# Patient Record
Sex: Male | Born: 1975 | Race: White | Hispanic: No | Marital: Married | Smoking: Never smoker
Health system: Southern US, Community
[De-identification: ages and names within clinical notes are randomized; demographics above are authoritative.]

## PROBLEM LIST (undated history)

## (undated) DIAGNOSIS — F32A Depression, unspecified: Secondary | ICD-10-CM

## (undated) DIAGNOSIS — R7989 Other specified abnormal findings of blood chemistry: Secondary | ICD-10-CM

## (undated) DIAGNOSIS — G4733 Obstructive sleep apnea (adult) (pediatric): Secondary | ICD-10-CM

## (undated) DIAGNOSIS — K219 Gastro-esophageal reflux disease without esophagitis: Secondary | ICD-10-CM

## (undated) DIAGNOSIS — R634 Abnormal weight loss: Secondary | ICD-10-CM

## (undated) DIAGNOSIS — F329 Major depressive disorder, single episode, unspecified: Secondary | ICD-10-CM

## (undated) DIAGNOSIS — I10 Essential (primary) hypertension: Secondary | ICD-10-CM

## (undated) DIAGNOSIS — R251 Tremor, unspecified: Secondary | ICD-10-CM

## (undated) HISTORY — PX: COLONOSCOPY: SHX174

## (undated) HISTORY — PX: MASTECTOMY PARTIAL / LUMPECTOMY: SUR851

## (undated) HISTORY — DX: Depression, unspecified: F32.A

---
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
## 2010-08-15 ENCOUNTER — Ambulatory Visit: Payer: Self-pay | Admitting: Internal Medicine

## 2011-03-28 ENCOUNTER — Ambulatory Visit: Payer: Self-pay | Admitting: General Practice

## 2015-08-30 ENCOUNTER — Ambulatory Visit: Payer: Self-pay | Admitting: Physician Assistant

## 2015-08-30 ENCOUNTER — Encounter: Payer: Self-pay | Admitting: Physician Assistant

## 2015-08-30 VITALS — BP 139/80 | HR 99 | Temp 98.6°F

## 2015-08-30 DIAGNOSIS — B353 Tinea pedis: Secondary | ICD-10-CM

## 2015-08-30 NOTE — Progress Notes (Signed)
S: c/o foot fungus, states similar sx in the past, was given a pill, it got better but now its back, no drainage from areas, not painful, no other complaints  O: vitals wnl nad, skin on bottom of both feet with thickened, peeling, cracked areas, no drainage, n/v intact  A: tinea pedis  P: was going to give patient oral medication, pt is refusing labs to assess his liver enzymes, told him it would be negligent for me to give this medication without checking liver enzyme; explained that antifungals can cause liver damage, pt states his doctor doesn't make him do that, explained that is not our policy here and he should f/u with his own doctor

## 2017-04-16 ENCOUNTER — Other Ambulatory Visit: Payer: Self-pay | Admitting: Internal Medicine

## 2017-04-16 DIAGNOSIS — N6310 Unspecified lump in the right breast, unspecified quadrant: Secondary | ICD-10-CM

## 2017-04-17 ENCOUNTER — Other Ambulatory Visit: Payer: Self-pay | Admitting: Internal Medicine

## 2017-04-17 DIAGNOSIS — N6310 Unspecified lump in the right breast, unspecified quadrant: Secondary | ICD-10-CM

## 2017-04-24 ENCOUNTER — Ambulatory Visit
Admission: RE | Admit: 2017-04-24 | Discharge: 2017-04-24 | Disposition: A | Discharge status: Home / Self Care | Payer: Managed Care, Other (non HMO) | Source: Ambulatory Visit | Attending: Internal Medicine | Admitting: Internal Medicine

## 2017-04-24 DIAGNOSIS — N6310 Unspecified lump in the right breast, unspecified quadrant: Secondary | ICD-10-CM | POA: Insufficient documentation

## 2017-04-24 DIAGNOSIS — N62 Hypertrophy of breast: Secondary | ICD-10-CM | POA: Insufficient documentation

## 2018-02-05 ENCOUNTER — Other Ambulatory Visit: Payer: Self-pay | Admitting: Neurology

## 2018-02-05 DIAGNOSIS — R251 Tremor, unspecified: Secondary | ICD-10-CM

## 2018-02-16 ENCOUNTER — Ambulatory Visit
Admission: RE | Admit: 2018-02-16 | Discharge: 2018-02-16 | Disposition: A | Discharge status: Home / Self Care | Payer: Managed Care, Other (non HMO) | Source: Ambulatory Visit | Attending: Neurology | Admitting: Neurology

## 2018-02-16 DIAGNOSIS — R251 Tremor, unspecified: Secondary | ICD-10-CM | POA: Diagnosis not present

## 2018-02-16 MED ORDER — GADOBENATE DIMEGLUMINE 529 MG/ML IV SOLN
19.0000 mL | Freq: Once | INTRAVENOUS | Status: AC | PRN
  Administered 2018-02-16: 19 mL via INTRAVENOUS

## 2018-09-18 ENCOUNTER — Other Ambulatory Visit: Payer: Self-pay | Admitting: Internal Medicine

## 2018-09-24 ENCOUNTER — Other Ambulatory Visit: Payer: Self-pay | Admitting: Internal Medicine

## 2018-09-24 DIAGNOSIS — R634 Abnormal weight loss: Secondary | ICD-10-CM

## 2018-10-02 ENCOUNTER — Other Ambulatory Visit: Payer: Self-pay

## 2018-10-02 ENCOUNTER — Ambulatory Visit
Admission: RE | Admit: 2018-10-02 | Discharge: 2018-10-02 | Disposition: A | Discharge status: Home / Self Care | Payer: Managed Care, Other (non HMO) | Source: Ambulatory Visit | Attending: Internal Medicine | Admitting: Internal Medicine

## 2018-10-02 DIAGNOSIS — R634 Abnormal weight loss: Secondary | ICD-10-CM | POA: Diagnosis not present

## 2018-12-16 ENCOUNTER — Other Ambulatory Visit: Payer: Self-pay | Admitting: Internal Medicine

## 2018-12-16 DIAGNOSIS — R634 Abnormal weight loss: Secondary | ICD-10-CM

## 2018-12-16 DIAGNOSIS — R1084 Generalized abdominal pain: Secondary | ICD-10-CM

## 2018-12-19 ENCOUNTER — Other Ambulatory Visit: Payer: Self-pay | Admitting: Internal Medicine

## 2018-12-19 DIAGNOSIS — R748 Abnormal levels of other serum enzymes: Secondary | ICD-10-CM

## 2018-12-19 DIAGNOSIS — R634 Abnormal weight loss: Secondary | ICD-10-CM

## 2018-12-27 ENCOUNTER — Encounter (INDEPENDENT_AMBULATORY_CARE_PROVIDER_SITE_OTHER): Payer: Self-pay

## 2018-12-27 ENCOUNTER — Other Ambulatory Visit: Payer: Self-pay

## 2018-12-27 ENCOUNTER — Ambulatory Visit
Admission: RE | Admit: 2018-12-27 | Discharge: 2018-12-27 | Disposition: A | Discharge status: Home / Self Care | Payer: Managed Care, Other (non HMO) | Source: Ambulatory Visit | Attending: Internal Medicine | Admitting: Internal Medicine

## 2018-12-27 DIAGNOSIS — R748 Abnormal levels of other serum enzymes: Secondary | ICD-10-CM | POA: Insufficient documentation

## 2018-12-27 DIAGNOSIS — R634 Abnormal weight loss: Secondary | ICD-10-CM | POA: Diagnosis present

## 2018-12-27 MED ORDER — IOHEXOL 300 MG/ML  SOLN
100.0000 mL | Freq: Once | INTRAMUSCULAR | Status: AC | PRN
  Administered 2018-12-27: 100 mL via INTRAVENOUS

## 2019-01-02 ENCOUNTER — Ambulatory Visit: Payer: Managed Care, Other (non HMO)

## 2019-01-02 ENCOUNTER — Other Ambulatory Visit: Payer: Managed Care, Other (non HMO)

## 2019-01-07 ENCOUNTER — Other Ambulatory Visit: Payer: Self-pay | Admitting: Internal Medicine

## 2019-01-07 DIAGNOSIS — R748 Abnormal levels of other serum enzymes: Secondary | ICD-10-CM

## 2019-01-07 DIAGNOSIS — R7989 Other specified abnormal findings of blood chemistry: Secondary | ICD-10-CM

## 2019-01-14 ENCOUNTER — Ambulatory Visit: Payer: Managed Care, Other (non HMO)

## 2019-01-22 ENCOUNTER — Other Ambulatory Visit: Payer: Self-pay | Admitting: Student

## 2019-01-23 ENCOUNTER — Other Ambulatory Visit: Payer: Self-pay

## 2019-01-23 ENCOUNTER — Ambulatory Visit
Admission: RE | Admit: 2019-01-23 | Discharge: 2019-01-23 | Disposition: A | Discharge status: Home / Self Care | Payer: Managed Care, Other (non HMO) | Source: Ambulatory Visit | Attending: Internal Medicine | Admitting: Internal Medicine

## 2019-01-23 DIAGNOSIS — Z7902 Long term (current) use of antithrombotics/antiplatelets: Secondary | ICD-10-CM | POA: Insufficient documentation

## 2019-01-23 DIAGNOSIS — Z79899 Other long term (current) drug therapy: Secondary | ICD-10-CM | POA: Diagnosis not present

## 2019-01-23 DIAGNOSIS — R7989 Other specified abnormal findings of blood chemistry: Secondary | ICD-10-CM | POA: Insufficient documentation

## 2019-01-23 DIAGNOSIS — Z9010 Acquired absence of unspecified breast and nipple: Secondary | ICD-10-CM | POA: Diagnosis not present

## 2019-01-23 DIAGNOSIS — I10 Essential (primary) hypertension: Secondary | ICD-10-CM | POA: Diagnosis not present

## 2019-01-23 DIAGNOSIS — G4733 Obstructive sleep apnea (adult) (pediatric): Secondary | ICD-10-CM | POA: Diagnosis not present

## 2019-01-23 DIAGNOSIS — R748 Abnormal levels of other serum enzymes: Secondary | ICD-10-CM | POA: Diagnosis present

## 2019-01-23 HISTORY — DX: Gastro-esophageal reflux disease without esophagitis: K21.9

## 2019-01-23 HISTORY — DX: Abnormal weight loss: R63.4

## 2019-01-23 HISTORY — DX: Tremor, unspecified: R25.1

## 2019-01-23 HISTORY — DX: Other specified abnormal findings of blood chemistry: R79.89

## 2019-01-23 HISTORY — DX: Essential (primary) hypertension: I10

## 2019-01-23 HISTORY — DX: Obstructive sleep apnea (adult) (pediatric): G47.33

## 2019-01-23 LAB — CBC
HCT: 34.5 % — ABNORMAL LOW (ref 39.0–52.0)
Hemoglobin: 12.5 g/dL — ABNORMAL LOW (ref 13.0–17.0)
MCH: 33.7 pg (ref 26.0–34.0)
MCHC: 36.2 g/dL — ABNORMAL HIGH (ref 30.0–36.0)
MCV: 93 fL (ref 80.0–100.0)
Platelets: 244 10*3/uL (ref 150–400)
RBC: 3.71 MIL/uL — ABNORMAL LOW (ref 4.22–5.81)
RDW: 14.7 % (ref 11.5–15.5)
WBC: 7.8 10*3/uL (ref 4.0–10.5)
nRBC: 0 % (ref 0.0–0.2)

## 2019-01-23 LAB — PROTIME-INR
INR: 1.1 (ref 0.8–1.2)
Prothrombin Time: 13.8 seconds (ref 11.4–15.2)

## 2019-01-23 MED ORDER — FENTANYL CITRATE (PF) 100 MCG/2ML IJ SOLN
INTRAMUSCULAR | Status: AC | PRN
  Administered 2019-01-23: 50 ug via INTRAVENOUS
  Administered 2019-01-23: 25 ug via INTRAVENOUS

## 2019-01-23 MED ORDER — MIDAZOLAM HCL 5 MG/5ML IJ SOLN
INTRAMUSCULAR | Status: AC | PRN
  Administered 2019-01-23: 2 mg via INTRAVENOUS
  Administered 2019-01-23: 1 mg via INTRAVENOUS

## 2019-01-23 MED ORDER — MIDAZOLAM HCL 5 MG/5ML IJ SOLN
INTRAMUSCULAR | Status: AC
  Filled 2019-01-23: qty 5

## 2019-01-23 MED ORDER — SODIUM CHLORIDE 0.9 % IV SOLN
INTRAVENOUS | Status: DC
  Administered 2019-01-23: 08:00:00 via INTRAVENOUS

## 2019-01-23 MED ORDER — FENTANYL CITRATE (PF) 100 MCG/2ML IJ SOLN
INTRAMUSCULAR | Status: AC
  Filled 2019-01-23: qty 4

## 2019-01-23 NOTE — Progress Notes (Signed)
Pt stable after Liver bx.VSS.ABD-benign.D/C instructions given.F/U with his MD.

## 2019-01-23 NOTE — H&P (Signed)
Chief Complaint: Patient was seen in consultation today for No chief complaint on file.  at the request of Stanton Kidneyoledo,Teodoro K  Referring Physician(s): Stanton Kidneyoledo,Teodoro K  Supervising Physician: Register  [facility]  History of Present Illness: Ryan Miranda is a 43 y.o. male  In for liver bx for elevated lft's.  Past Medical History:  Diagnosis Date   Elevated ferritin level    Elevated LFTs    Hypertension    Laryngopharyngeal reflux (LPR)    OSA on CPAP    Tremors of nervous system    Unintentional weight loss     Past Surgical History:  Procedure Laterality Date   BREAST EXCISIONAL BIOPSY Left 1993   neg   COLONOSCOPY     MASTECTOMY PARTIAL / LUMPECTOMY      Allergies: Patient has no known allergies.  Medications: Prior to Admission medications   Medication Sig Start Date End Date Taking? Authorizing Provider  clopidogrel (PLAVIX) 75 MG tablet Take 75 mg by mouth daily.   Yes [provider]  ibuprofen (ADVIL) 200 MG tablet Take 200 mg by mouth every 6 (six) hours as needed.   Yes [provider]  lamoTRIgine (LAMICTAL) 25 MG tablet Take 50 mg by mouth daily.   Yes [provider]  venlafaxine XR (EFFEXOR-XR) 37.5 MG 24 hr capsule Take 37.5 mg by mouth daily with breakfast.   Yes [provider]  hydrochlorothiazide (HYDRODIURIL) 25 MG tablet Take by mouth.    [provider]     No family history on file.  Social History   Socioeconomic History   Marital status: Married    Spouse name: Not on file   Number of children: Not on file   Years of education: Not on file   Highest education level: Not on file  Occupational History   Not on file  Social Needs   Financial resource strain: Not on file   Food insecurity    Worry: Not on file    Inability: Not on file   Transportation needs    Medical: Not on file    Non-medical: Not on file  Tobacco Use    Smoking status: Never Smoker   Smokeless tobacco: Never Used  Substance and Sexual Activity   Alcohol use: Not Currently    Alcohol/week: 0.0 standard drinks   Drug use: Not Currently   Sexual activity: Not on file  Lifestyle   Physical activity    Days per week: Not on file    Minutes per session: Not on file   Stress: Not on file  Relationships   Social connections    Talks on phone: Not on file    Gets together: Not on file    Attends religious service: Not on file    Active member of club or organization: Not on file    Attends meetings of clubs or organizations: Not on file    Relationship status: Not on file  Other Topics Concern   Not on file  Social History Narrative   Not on file      Review of Systems: A 12 point ROS discussed and pertinent positives are indicated in the HPI above.  All other systems are negative.  Review of Systems  Vital Signs: BP 107/74    Pulse 88    Temp 98.8 F (37.1 C) (Oral)    Resp 15    Ht 5\' 10"  (1.778 m)    Wt 79.4 kg  SpO2 100%    BMI 25.11 kg/m   Physical Exam Pt.alert.VSS.Chest clear.Abd-benign.  Imaging: Ct Abdomen Pelvis W Contrast  Result Date: 12/27/2018 CLINICAL DATA:  Abnormal liver function tests. Weight loss over 6 months. Evaluate for occult malignancy. EXAM: CT ABDOMEN AND PELVIS WITH CONTRAST TECHNIQUE: Multidetector CT imaging of the abdomen and pelvis was performed using the standard protocol following bolus administration of intravenous contrast. CONTRAST:  150mL OMNIPAQUE IOHEXOL 300 MG/ML  SOLN COMPARISON:  10/02/2018 ultrasound. 08/15/2010 CT. FINDINGS: Lower chest: Clear lung bases. Normal heart size without pericardial or pleural effusion. Fluid level in the distal esophagus. Hepatobiliary: Marked hepatic steatosis and hepatomegaly at 25.2 cm craniocaudal. No focal liver lesion. Normal gallbladder, without biliary ductal dilatation. Pancreas: Normal, without mass or ductal dilatation. Spleen: Normal in  size, without focal abnormality. Adrenals/Urinary Tract: Normal adrenal glands. Normal kidneys, without hydronephrosis. Normal urinary bladder. Stomach/Bowel: Normal stomach, without wall thickening. Normal colon, appendix, and terminal ileum. Normal small bowel. Vascular/Lymphatic: Normal caliber of the aorta and branch vessels. No abdominopelvic adenopathy. Reproductive: Normal prostate. Other: No significant free fluid. Musculoskeletal: No acute osseous abnormality. IMPRESSION: 1. No acute process in the abdomen or pelvis. 2. Marked hepatic steatosis and hepatomegaly. 3. Esophageal fluid level suggests dysmotility or gastroesophageal reflux. Electronically Signed   By: Abigail Miyamoto M.D.   On: 12/27/2018 11:29    Labs:  CBC: Recent Labs    01/23/19 0726  WBC 7.8  HGB 12.5*  HCT 34.5*  PLT 244    COAGS: Recent Labs    01/23/19 0726  INR 1.1    BMP: No results for input(s): NA, K, CL, CO2, GLUCOSE, BUN, CALCIUM, CREATININE, GFRNONAA, GFRAA in the last 8760 hours.  Invalid input(s): CMP  LIVER FUNCTION TESTS: No results for input(s): BILITOT, AST, ALT, ALKPHOS, PROT, ALBUMIN in the last 8760 hours.  TUMOR MARKERS: No results for input(s): AFPTM, CEA, CA199, CHROMGRNA in the last 8760 hours.  Assessment and Plan:  Elevated LFT's.LIVER bx planned. Thank you for this interesting consult.  I greatly enjoyed meeting Ryan Miranda and look forward to participating in their care.  A copy of this report was sent to the requesting provider on this date.  Electronically Signed: Register, Melanie Crazier, MD 01/23/2019, 8:50 AM   I spent a total of  15 minin face to face in clinical consultation, greater than 50% of which was counseling/coordinating care for the pt.

## 2019-01-30 LAB — SURGICAL PATHOLOGY

## 2019-02-05 ENCOUNTER — Other Ambulatory Visit: Payer: Self-pay

## 2019-02-05 ENCOUNTER — Inpatient Hospital Stay: Payer: Managed Care, Other (non HMO) | Attending: Oncology | Admitting: Oncology

## 2019-02-05 ENCOUNTER — Encounter: Payer: Self-pay | Admitting: Oncology

## 2019-02-05 ENCOUNTER — Encounter (INDEPENDENT_AMBULATORY_CARE_PROVIDER_SITE_OTHER): Payer: Self-pay

## 2019-02-05 ENCOUNTER — Inpatient Hospital Stay: Payer: Managed Care, Other (non HMO)

## 2019-02-05 DIAGNOSIS — R5383 Other fatigue: Secondary | ICD-10-CM | POA: Insufficient documentation

## 2019-02-05 DIAGNOSIS — R7989 Other specified abnormal findings of blood chemistry: Secondary | ICD-10-CM | POA: Insufficient documentation

## 2019-02-05 DIAGNOSIS — Z809 Family history of malignant neoplasm, unspecified: Secondary | ICD-10-CM | POA: Diagnosis not present

## 2019-02-05 DIAGNOSIS — G4733 Obstructive sleep apnea (adult) (pediatric): Secondary | ICD-10-CM | POA: Diagnosis not present

## 2019-02-05 DIAGNOSIS — N62 Hypertrophy of breast: Secondary | ICD-10-CM | POA: Diagnosis not present

## 2019-02-05 DIAGNOSIS — Z7902 Long term (current) use of antithrombotics/antiplatelets: Secondary | ICD-10-CM | POA: Insufficient documentation

## 2019-02-05 DIAGNOSIS — R251 Tremor, unspecified: Secondary | ICD-10-CM | POA: Insufficient documentation

## 2019-02-05 DIAGNOSIS — Z79899 Other long term (current) drug therapy: Secondary | ICD-10-CM | POA: Diagnosis not present

## 2019-02-05 DIAGNOSIS — R630 Anorexia: Secondary | ICD-10-CM | POA: Diagnosis not present

## 2019-02-05 DIAGNOSIS — M2550 Pain in unspecified joint: Secondary | ICD-10-CM | POA: Diagnosis not present

## 2019-02-05 DIAGNOSIS — R809 Proteinuria, unspecified: Secondary | ICD-10-CM | POA: Diagnosis not present

## 2019-02-05 DIAGNOSIS — R42 Dizziness and giddiness: Secondary | ICD-10-CM

## 2019-02-05 DIAGNOSIS — Z8249 Family history of ischemic heart disease and other diseases of the circulatory system: Secondary | ICD-10-CM | POA: Diagnosis not present

## 2019-02-05 DIAGNOSIS — R634 Abnormal weight loss: Secondary | ICD-10-CM

## 2019-02-05 DIAGNOSIS — F329 Major depressive disorder, single episode, unspecified: Secondary | ICD-10-CM | POA: Insufficient documentation

## 2019-02-05 DIAGNOSIS — K760 Fatty (change of) liver, not elsewhere classified: Secondary | ICD-10-CM | POA: Diagnosis not present

## 2019-02-05 DIAGNOSIS — F1011 Alcohol abuse, in remission: Secondary | ICD-10-CM | POA: Insufficient documentation

## 2019-02-05 DIAGNOSIS — T510X1A Toxic effect of ethanol, accidental (unintentional), initial encounter: Secondary | ICD-10-CM | POA: Diagnosis not present

## 2019-02-05 DIAGNOSIS — R160 Hepatomegaly, not elsewhere classified: Secondary | ICD-10-CM | POA: Insufficient documentation

## 2019-02-05 DIAGNOSIS — Z148 Genetic carrier of other disease: Secondary | ICD-10-CM

## 2019-02-05 LAB — FERRITIN: Ferritin: 2573 ng/mL — ABNORMAL HIGH (ref 24–336)

## 2019-02-05 LAB — CBC WITH DIFFERENTIAL/PLATELET
Abs Immature Granulocytes: 0.03 10*3/uL (ref 0.00–0.07)
Basophils Absolute: 0.1 10*3/uL (ref 0.0–0.1)
Basophils Relative: 1 %
Eosinophils Absolute: 0.1 10*3/uL (ref 0.0–0.5)
Eosinophils Relative: 1 %
HCT: 35.1 % — ABNORMAL LOW (ref 39.0–52.0)
Hemoglobin: 12.3 g/dL — ABNORMAL LOW (ref 13.0–17.0)
Immature Granulocytes: 0 %
Lymphocytes Relative: 21 %
Lymphs Abs: 1.8 10*3/uL (ref 0.7–4.0)
MCH: 33.2 pg (ref 26.0–34.0)
MCHC: 35 g/dL (ref 30.0–36.0)
MCV: 94.9 fL (ref 80.0–100.0)
Monocytes Absolute: 0.5 10*3/uL (ref 0.1–1.0)
Monocytes Relative: 6 %
Neutro Abs: 6 10*3/uL (ref 1.7–7.7)
Neutrophils Relative %: 71 %
Platelets: 244 10*3/uL (ref 150–400)
RBC: 3.7 MIL/uL — ABNORMAL LOW (ref 4.22–5.81)
RDW: 15.3 % (ref 11.5–15.5)
WBC: 8.4 10*3/uL (ref 4.0–10.5)
nRBC: 0 % (ref 0.0–0.2)

## 2019-02-05 LAB — HEPATIC FUNCTION PANEL
ALT: 77 U/L — ABNORMAL HIGH (ref 0–44)
AST: 290 U/L — ABNORMAL HIGH (ref 15–41)
Albumin: 3.8 g/dL (ref 3.5–5.0)
Alkaline Phosphatase: 273 U/L — ABNORMAL HIGH (ref 38–126)
Bilirubin, Direct: 1.3 mg/dL — ABNORMAL HIGH (ref 0.0–0.2)
Indirect Bilirubin: 1.2 mg/dL — ABNORMAL HIGH (ref 0.3–0.9)
Total Bilirubin: 2.5 mg/dL — ABNORMAL HIGH (ref 0.3–1.2)
Total Protein: 8.4 g/dL — ABNORMAL HIGH (ref 6.5–8.1)

## 2019-02-05 LAB — PROTIME-INR
INR: 1 (ref 0.8–1.2)
Prothrombin Time: 13.3 seconds (ref 11.4–15.2)

## 2019-02-05 LAB — BRAIN NATRIURETIC PEPTIDE: B Natriuretic Peptide: 46 pg/mL (ref 0.0–100.0)

## 2019-02-05 LAB — URIC ACID: Uric Acid, Serum: 4.9 mg/dL (ref 3.7–8.6)

## 2019-02-05 LAB — LACTATE DEHYDROGENASE: LDH: 174 U/L (ref 98–192)

## 2019-02-05 LAB — TECHNOLOGIST SMEAR REVIEW

## 2019-02-05 LAB — IRON AND TIBC
Iron: 193 ug/dL — ABNORMAL HIGH (ref 45–182)
Saturation Ratios: 61 % — ABNORMAL HIGH (ref 17.9–39.5)
TIBC: 319 ug/dL (ref 250–450)
UIBC: 126 ug/dL

## 2019-02-05 LAB — APTT: aPTT: 33 seconds (ref 24–36)

## 2019-02-05 NOTE — Progress Notes (Signed)
New patient referral for abnormal mineral levels.

## 2019-02-06 ENCOUNTER — Encounter: Payer: Self-pay | Admitting: Oncology

## 2019-02-06 HISTORY — DX: Other disorders of iron metabolism: E83.19

## 2019-02-06 LAB — AFP TUMOR MARKER: AFP, Serum, Tumor Marker: 7.3 ng/mL (ref 0.0–8.3)

## 2019-02-06 NOTE — Progress Notes (Signed)
Hematology/Oncology Consult note [facility] Telephone:([phone] [phone]   Patient Care Team: Rusty Aus, MD as PCP - General (Internal Medicine)  REFERRING PROVIDER: Efrain Sella, MD  CHIEF COMPLAINTS/REASON FOR VISIT:  Evaluation of elevated ferritin level.  HISTORY OF PRESENTING ILLNESS:  Ryan Miranda is a 43 y.o. male who was seen in consultation at the request of Alice Reichert, Benay Pike, MD for evaluation of elevated ferritin level. Extensive records review was performed by me. Patient brought a piece of paper with list of symptoms patient has experienced recently.  List was prepared by his wife. Patient has had tremors for the past several months.  Was seen by Dr. Manuella Ghazi neurology . Patient was empirically placed on Lamictal tremors and was found to have possible epileptogenic activity on EEG Patient also takes Plavix, per Dr. Idalia Needle recommendation for possible CVA. He was also recently seen by gastroenterology.  Dr. Alice Reichert for abnormal liver function test and elevated ferritin. Patient has multiple complaints including trauma, unintentional weight loss, fatigue.  Generalized arthralgia including multiple joints.loss of appetite,   Patient has had extensive laboratory work-up. 12/10/2018 ESR 19 TSH 4.211 Cortisol 29.5 Testosterone 559 AST 280, ALT 123, alkaline phosphatase 235 bilirubin 3.6 creatinine 0.7 UA showed positive nitrate, trace leukocyte esterase, proteinuria  12/23/2018 iron 250, iron saturation 94 ceruloplasmin 22.2, ferritin> 1500 Negative hepatitis A IgM, hepatitis B surface antigen and core IgM, hepatitis C antibody PSA 2.26 12/25/2018 hemochromatosis screening single mutation H63D identified.  06/12/2018, stress test negative.  Normal right ventricular systolic function.  Mild valvular regurgitation.  No valvular stenosis noted. 10/01/2018 x-ray PA and lateral results not available to me.  Care  everywhere. 02/16/2018 MRI brain with and without contrast no cause of the presenting symptoms is not identified.  Normal exam except a small patchy area of T2 and flair signal in the white matter on the left and an insignificant venous on the right cerebellum. 10/02/2018 ultrasound abdomen complete showed no acute abnormality.  Diffuse hepatic steatosis. 12/27/2018 CT abdomen pelvis with contrast showed no acute process in abdomen or pelvis.  Market hepatic steatosis and hepatomegaly.  Esophageal fluid level suggests dysmotility or gastro-esophageal reflux.   History of previous blood transfusion: denies Patient admits to history of alcohol abuse, used to drink 6-12 beers a day/or couple glasses of wine every night.  He stopped drinking alcohol about a month ago.  Per patient's request, patient's wife was called she was able to overheard conversation and anticipating discussion.  Review of Systems  Constitutional: Positive for appetite change, fatigue and unexpected weight change. Negative for chills and fever.  HENT:   Negative for hearing loss and voice change.   Eyes: Negative for eye problems and icterus.  Respiratory: Positive for shortness of breath. Negative for chest tightness and cough.   Cardiovascular: Negative for chest pain and leg swelling.  Gastrointestinal: Negative for abdominal distention and abdominal pain.  Endocrine: Negative for hot flashes.  Genitourinary: Negative for difficulty urinating, dysuria and frequency.   Musculoskeletal: Positive for arthralgias.  Skin: Negative for itching and rash.  Neurological: Positive for light-headedness. Negative for numbness.  Hematological: Negative for adenopathy. Does not bruise/bleed easily.  Psychiatric/Behavioral: Positive for depression. The patient is nervous/anxious.     MEDICAL HISTORY:  Past Medical History:  Diagnosis Date  . Depression   . Elevated ferritin level   . Elevated LFTs   . Hypertension   . Iron  overload 02/06/2019  . Laryngopharyngeal reflux (LPR)   .  OSA on CPAP   . Tremors of nervous system   . Unintentional weight loss     SURGICAL HISTORY: Past Surgical History:  Procedure Laterality Date  . BREAST EXCISIONAL BIOPSY Left 1993   neg  . COLONOSCOPY    . MASTECTOMY PARTIAL / LUMPECTOMY      SOCIAL HISTORY: Social History   Socioeconomic History  . Marital status: Married    Spouse name: Not on file  . Number of children: Not on file  . Years of education: Not on file  . Highest education level: Not on file  Occupational History  . Not on file  Social Needs  . Financial resource strain: Not on file  . Food insecurity    Worry: Not on file    Inability: Not on file  . Transportation needs    Medical: Not on file    Non-medical: Not on file  Tobacco Use  . Smoking status: Never Smoker  . Smokeless tobacco: Never Used  Substance and Sexual Activity  . Alcohol use: Not Currently    Alcohol/week: 0.0 standard drinks  . Drug use: Not Currently  . Sexual activity: Yes  Lifestyle  . Physical activity    Days per week: Not on file    Minutes per session: Not on file  . Stress: Not on file  Relationships  . Social Herbalist on phone: Not on file    Gets together: Not on file    Attends religious service: Not on file    Active member of club or organization: Not on file    Attends meetings of clubs or organizations: Not on file    Relationship status: Not on file  . Intimate partner violence    Fear of current or ex partner: Not on file    Emotionally abused: Not on file    Physically abused: Not on file    Forced sexual activity: Not on file  Other Topics Concern  . Not on file  Social History Narrative  . Not on file    FAMILY HISTORY: Family History  Problem Relation Age of Onset  . Cancer Mother   . Hypertension Father   . Cancer Maternal Aunt   . Cancer Maternal Grandfather     ALLERGIES:  has No Known  Allergies.  MEDICATIONS:  Current Outpatient Medications  Medication Sig Dispense Refill  . clopidogrel (PLAVIX) 75 MG tablet Take 75 mg by mouth daily.    Marland Kitchen ibuprofen (ADVIL) 200 MG tablet Take 200 mg by mouth every 6 (six) hours as needed.    . lamoTRIgine (LAMICTAL) 25 MG tablet Take 50 mg by mouth daily.    Marland Kitchen venlafaxine XR (EFFEXOR-XR) 37.5 MG 24 hr capsule Take 37.5 mg by mouth daily with breakfast.     No current facility-administered medications for this visit.      PHYSICAL EXAMINATION: ECOG PERFORMANCE STATUS: 1 - Symptomatic but completely ambulatory Vitals:   02/05/19 1527  BP: 117/84  Pulse: (!) 108  Resp: 18  Temp: 98 F (36.7 C)  SpO2: 97%   Filed Weights   02/05/19 1527  Weight: 180 lb 12.8 oz (82 kg)    Physical Exam Constitutional:      General: He is not in acute distress.    Appearance: He is obese.  HENT:     Head: Normocephalic and atraumatic.  Eyes:     General: No scleral icterus.    Pupils: Pupils are equal, round, and reactive to  light.  Neck:     Musculoskeletal: Normal range of motion and neck supple.  Cardiovascular:     Rate and Rhythm: Normal rate and regular rhythm.     Heart sounds: Normal heart sounds.  Pulmonary:     Effort: Pulmonary effort is normal. No respiratory distress.     Breath sounds: No wheezing.  Abdominal:     General: Bowel sounds are normal. There is no distension.     Palpations: Abdomen is soft. There is no mass.     Tenderness: There is no abdominal tenderness.  Musculoskeletal: Normal range of motion.        General: No deformity.     Comments: Trace edema bilaterally.   Skin:    General: Skin is warm and dry.     Findings: No erythema or rash.  Neurological:     General: No focal deficit present.     Mental Status: He is alert and oriented to person, place, and time.     Cranial Nerves: No cranial nerve deficit.     Coordination: Coordination normal.  Psychiatric:     Comments: anxious    gynecomastia   LABORATORY DATA:  I have reviewed the data as listed Lab Results  Component Value Date   WBC 8.4 02/05/2019   HGB 12.3 (L) 02/05/2019   HCT 35.1 (L) 02/05/2019   MCV 94.9 02/05/2019   PLT 244 02/05/2019   Recent Labs    02/05/19 1616  PROT 8.4*  ALBUMIN 3.8  AST 290*  ALT 77*  ALKPHOS 273*  BILITOT 2.5*  BILIDIR 1.3*  IBILI 1.2*   Iron/TIBC/Ferritin/ %Sat    Component Value Date/Time   IRON 193 (H) 02/05/2019 1616   TIBC 319 02/05/2019 1616   FERRITIN 2,573 (H) 02/05/2019 1616   IRONPCTSAT 61 (H) 02/05/2019 1616      RADIOGRAPHIC STUDIES: I have personally reviewed the radiological images as listed and agreed with the findings in the report.  US Biopsy (liver)  Result Date: 01/23/2019 INDICATION: Elevated LFTs. EXAM: Ultrasound directed liver biopsy. MEDICATIONS: Local 1% lidocaine. ANESTHESIA/SEDATION: Moderate (conscious) sedation was employed during this procedure. A total of Versed 3.0 mg and Fentanyl 75.0 mcg was administered intravenously. Moderate Sedation Time: 10 minutes. The patient's level of consciousness and vital signs were monitored continuously by radiology nursing throughout the procedure under my direct supervision. FLUOROSCOPY TIME:  Fluoroscopy Time: None. COMPLICATIONS: None immediate. PROCEDURE: Informed written consent was obtained from the patient after a thorough discussion of the procedural risks, benefits and alternatives. All questions were addressed. Maximal Sterile Barrier Technique was utilized including caps, mask, sterile gowns, sterile gloves, sterile drape, hand hygiene and skin antiseptic. A timeout was performed prior to the initiation of the procedure. Local anesthesia was performed 1% lidocaine. Under ultrasound guidance a introduction cannula was placed and 2 separate 18 gauge 22 mm core biopsies were obtained. No complications. Discharge instructions given prior to and following the procedure. Follow-up with the  patient's physician. IMPRESSION: Successful ultrasound directed liver biopsy. Electronically Signed   By: Marcello Moores  Register   On: 01/23/2019 09:26   RADIOGRAPHIC STUDIES: I have personally reviewed the radiological images as listed and agreed with the findings in the report. US Biopsy (liver)  Result Date: 01/23/2019 INDICATION: Elevated LFTs. EXAM: Ultrasound directed liver biopsy. MEDICATIONS: Local 1% lidocaine. ANESTHESIA/SEDATION: Moderate (conscious) sedation was employed during this procedure. A total of Versed 3.0 mg and Fentanyl 75.0 mcg was administered intravenously. Moderate Sedation Time: 10 minutes. The patient's level  of consciousness and vital signs were monitored continuously by radiology nursing throughout the procedure under my direct supervision. FLUOROSCOPY TIME:  Fluoroscopy Time: None. COMPLICATIONS: None immediate. PROCEDURE: Informed written consent was obtained from the patient after a thorough discussion of the procedural risks, benefits and alternatives. All questions were addressed. Maximal Sterile Barrier Technique was utilized including caps, mask, sterile gowns, sterile gloves, sterile drape, hand hygiene and skin antiseptic. A timeout was performed prior to the initiation of the procedure. Local anesthesia was performed 1% lidocaine. Under ultrasound guidance a introduction cannula was placed and 2 separate 18 gauge 22 mm core biopsies were obtained. No complications. Discharge instructions given prior to and following the procedure. Follow-up with the patient's physician. IMPRESSION: Successful ultrasound directed liver biopsy. Electronically Signed   By: Marcello Moores  Register   On: 01/23/2019 09:26     ASSESSMENT & PLAN:  1. Iron overload   2. Weight loss   3. Other fatigue   4. 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 History of alcohol abuse   7. Fatty liver   Labs are reviewed and discussed with patient. Radiology images were independently reviewed by  me. #Significantly elevated iron saturation and ferritin level consistent with iron overload. Liver biopsy pathology was reviewed. 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 Iron stain demonstrates mild parenchymal stent 20% Cofer cell deposition.  Status of hemochromatosis mutation H63D carrier, usually is asymptomatic or has mild symptoms.  However patient presented with extremely high iron level, possible compound heterozygous mutation status with a rare hemochromatosis mutation which was not included in the screening testing. Excess alcohol intake is a major risk factor for the development of liver disease in patients with HH, and iron overload potentiates the development of alcoholic liver disease Advised patient to have first-degree relative screening for hemochromatosis.  Avoid alcohol consumption.  Avoid uncooked seafood. Patient voices understanding.   Discussed with patient and wife that patient's symptoms are nonspecific, may or may not be explained by iron overload, increasing patient's iron store may help improving symptoms related to iron overload.. Multiple arthralgia can be related to iron overload Patient has neurologic symptoms including dizziness, confusion spells, muscle weakness He also has unilateral gynecomastia,  Iron overload can also be associated with secondary hypogonadism, generalized cognitive impairment as well as specific neurologic abnormalities  We will arrange patient to start phlebotomy 500 cc weekly with holding parameters of hemoglobin less than 11.5. Patient has mild baseline anemia, history of chronic alcohol abuse  Check CBC, technical smear review, LDH, uric acid PT, PTT, BNP, Patient's wife is extremely anxious and concerned about potential cancer which was not detected on CT scan.  She request to have a PET scan done..  I had a lengthy discussion with patient and his wife, explained to her that PET scan is not used as a screening  test.  If his work-up indicating potential underlying cancer. Recommend to proceed with basic lab work first, and proceed with phlebotomy.  We spent sufficient time to discuss many aspect of care, questions were answered to patient's satisfaction,   Orders Placed This Encounter  Procedures  . CBC with Differential/Platelet    Standing Status:   Future    Number of Occurrences:   1    Standing Expiration Date:   02/05/2020  . Technologist smear review    Standing Status:   Future    Number of Occurrences:   1    Standing Expiration Date:   02/05/2020  . Lactate  dehydrogenase    Standing Status:   Future    Number of Occurrences:   1    Standing Expiration Date:   02/05/2020  . Uric acid    Standing Status:   Future    Number of Occurrences:   1    Standing Expiration Date:   02/05/2020  . Iron and TIBC    Standing Status:   Future    Number of Occurrences:   1    Standing Expiration Date:   02/05/2020  . Ferritin    Standing Status:   Future    Number of Occurrences:   1    Standing Expiration Date:   02/05/2020  . AFP tumor marker    Standing Status:   Future    Number of Occurrences:   1    Standing Expiration Date:   02/05/2020  . Flow cytometry panel-leukemia/lymphoma work-up    Standing Status:   Future    Number of Occurrences:   1    Standing Expiration Date:   02/05/2020  . Hepatic function panel    Standing Status:   Future    Number of Occurrences:   1    Standing Expiration Date:   02/05/2020  . APTT    Standing Status:   Future    Number of Occurrences:   1    Standing Expiration Date:   02/05/2020  . Protime-INR    Standing Status:   Future    Number of Occurrences:   1    Standing Expiration Date:   02/05/2020  . Brain natriuretic peptide    Standing Status:   Future    Number of Occurrences:   1    Standing Expiration Date:   02/05/2020    All questions were answered. The patient knows to call the clinic with any problems questions or concerns.  Cc [place],  Benay Pike, MD  Return of visit: 3 weeks to discuss results.  Thank you for this kind referral and the opportunity to participate in the care of this patient. A copy of today's note is routed to referring provider   Total face to face encounter time for this patient visit was 60 min. >50% of the time was  spent in counseling and coordination of care.     Earlie Server, MD, PhD Hematology Oncology [facility] 02/06/2019

## 2019-02-07 ENCOUNTER — Telehealth: Payer: Self-pay

## 2019-02-07 ENCOUNTER — Other Ambulatory Visit: Payer: Self-pay

## 2019-02-07 LAB — COMP PANEL: LEUKEMIA/LYMPHOMA: CLINICAL INFO: 22

## 2019-02-07 NOTE — Telephone Encounter (Signed)
VM left requesting callback. This is regarding weekly lab (H&H) and phlebo x 3 and in 4 weeks Lab / MD/ and phlebo 1-2 days later.

## 2019-02-07 NOTE — Telephone Encounter (Signed)
-----   Message from Earlie Server, MD sent at 02/06/2019  9:44 PM EDT ----- Please let patient know that I recommend patient to proceed with therapeutic phlebotomy.  Arrange him to have weekly phlebotomy weekly x 3,  Lab in 4 weeks, MD and phlebotomy 1-2 days later.  I will go over blood work results with them. Thanks.

## 2019-02-12 ENCOUNTER — Inpatient Hospital Stay: Payer: Managed Care, Other (non HMO)

## 2019-02-12 ENCOUNTER — Other Ambulatory Visit: Payer: Self-pay

## 2019-02-12 LAB — HEMOGLOBIN AND HEMATOCRIT, BLOOD
HCT: 32.6 % — ABNORMAL LOW (ref 39.0–52.0)
Hemoglobin: 11.6 g/dL — ABNORMAL LOW (ref 13.0–17.0)

## 2019-02-19 ENCOUNTER — Inpatient Hospital Stay: Payer: Managed Care, Other (non HMO) | Attending: Oncology

## 2019-02-19 ENCOUNTER — Other Ambulatory Visit: Payer: Self-pay

## 2019-02-19 ENCOUNTER — Inpatient Hospital Stay: Payer: Managed Care, Other (non HMO)

## 2019-02-19 LAB — HEMOGLOBIN AND HEMATOCRIT, BLOOD
HCT: 29.2 % — ABNORMAL LOW (ref 39.0–52.0)
Hemoglobin: 10.7 g/dL — ABNORMAL LOW (ref 13.0–17.0)

## 2019-02-19 NOTE — Progress Notes (Signed)
Per MD order hold phlebotomy if hemoglobin is less than 11. Today's Hemoglobin is 10.7. Will hold phlebotomy based on MD order. Pt aware and reports continuous pain in joints (hips, knees and ankles), pt denies any other complaints and reports no changes. Pt to report to clinic as scheduled for labs (including H&H recheck) and phlebotomy next week (02/26/2019). Pt educated to call if any concerns arise. Pt stable at discharge.

## 2019-02-25 ENCOUNTER — Other Ambulatory Visit: Payer: Self-pay

## 2019-02-26 ENCOUNTER — Other Ambulatory Visit: Payer: Self-pay

## 2019-02-26 ENCOUNTER — Inpatient Hospital Stay: Payer: Managed Care, Other (non HMO)

## 2019-02-26 LAB — HEMOGLOBIN AND HEMATOCRIT, BLOOD
HCT: 31.4 % — ABNORMAL LOW (ref 39.0–52.0)
Hemoglobin: 11.1 g/dL — ABNORMAL LOW (ref 13.0–17.0)

## 2019-02-26 MED ORDER — SODIUM CHLORIDE 0.9 % IV SOLN
Freq: Once | INTRAVENOUS | Status: AC
  Administered 2019-02-26: 15:00:00 via INTRAVENOUS
  Filled 2019-02-26: qty 250

## 2019-02-26 NOTE — Progress Notes (Signed)
Per MD order therapeutic phlebotomy to be performed to remove 500cc and to hold if Hemoglobin is less than 11. 02/26/2019 Hemoglobin 11.1. Therapeutic phlebotomy performed per MD order, removing 500cc using 20 gauge PIV in left AC drink provided, Pt declines snack.  1432: Pt reports feeling hot. Pt states that "I am hot natured anyways" pt reports that it does not feel any different than when he gets hot during any other time. Pt denies any other symptoms such as lightheaded or dizziness. Procedure complete and NS began. Will monitor pt. Dr. Tasia Catchings and Osker Mason NP aware.  1500: VS remain stable Rulon Abide NP at chairside to assess pt. Pt stable. Pt continue to denies any symptoms and reports that the "hot feeling" has improved.  1518: Reviewed pt complaints, labs and VS with Dr. Tasia Catchings. Per Dr. Tasia Catchings okay to discharge pt home. VS stable, pt denies any new symptoms and states the "hot feeling" has resolved.

## 2019-03-05 ENCOUNTER — Other Ambulatory Visit: Payer: Self-pay

## 2019-03-05 ENCOUNTER — Inpatient Hospital Stay: Payer: Managed Care, Other (non HMO)

## 2019-03-05 DIAGNOSIS — R5383 Other fatigue: Secondary | ICD-10-CM

## 2019-03-05 DIAGNOSIS — K760 Fatty (change of) liver, not elsewhere classified: Secondary | ICD-10-CM

## 2019-03-05 DIAGNOSIS — Z148 Genetic carrier of other disease: Secondary | ICD-10-CM

## 2019-03-05 DIAGNOSIS — R634 Abnormal weight loss: Secondary | ICD-10-CM

## 2019-03-05 DIAGNOSIS — F1011 Alcohol abuse, in remission: Secondary | ICD-10-CM

## 2019-03-05 LAB — CBC WITH DIFFERENTIAL/PLATELET
Abs Immature Granulocytes: 0.22 10*3/uL — ABNORMAL HIGH (ref 0.00–0.07)
Basophils Absolute: 0.1 10*3/uL (ref 0.0–0.1)
Basophils Relative: 1 %
Eosinophils Absolute: 0 10*3/uL (ref 0.0–0.5)
Eosinophils Relative: 0 %
HCT: 23.5 % — ABNORMAL LOW (ref 39.0–52.0)
Hemoglobin: 8.5 g/dL — ABNORMAL LOW (ref 13.0–17.0)
Immature Granulocytes: 2 %
Lymphocytes Relative: 10 %
Lymphs Abs: 1.3 10*3/uL (ref 0.7–4.0)
MCH: 33.2 pg (ref 26.0–34.0)
MCHC: 36.2 g/dL — ABNORMAL HIGH (ref 30.0–36.0)
MCV: 91.8 fL (ref 80.0–100.0)
Monocytes Absolute: 1 10*3/uL (ref 0.1–1.0)
Monocytes Relative: 8 %
Neutro Abs: 10.3 10*3/uL — ABNORMAL HIGH (ref 1.7–7.7)
Neutrophils Relative %: 79 %
Platelets: 321 10*3/uL (ref 150–400)
RBC: 2.56 MIL/uL — ABNORMAL LOW (ref 4.22–5.81)
RDW: 16.3 % — ABNORMAL HIGH (ref 11.5–15.5)
WBC: 12.9 10*3/uL — ABNORMAL HIGH (ref 4.0–10.5)
nRBC: 0 % (ref 0.0–0.2)

## 2019-03-05 LAB — IRON AND TIBC
Iron: 209 ug/dL — ABNORMAL HIGH (ref 45–182)
Saturation Ratios: 90 % — ABNORMAL HIGH (ref 17.9–39.5)
TIBC: 231 ug/dL — ABNORMAL LOW (ref 250–450)
UIBC: 22 ug/dL

## 2019-03-05 LAB — HEPATIC FUNCTION PANEL
ALT: 49 U/L — ABNORMAL HIGH (ref 0–44)
AST: 171 U/L — ABNORMAL HIGH (ref 15–41)
Albumin: 3.4 g/dL — ABNORMAL LOW (ref 3.5–5.0)
Alkaline Phosphatase: 273 U/L — ABNORMAL HIGH (ref 38–126)
Bilirubin, Direct: 7.5 mg/dL — ABNORMAL HIGH (ref 0.0–0.2)
Indirect Bilirubin: 5.2 mg/dL — ABNORMAL HIGH (ref 0.3–0.9)
Total Bilirubin: 12.7 mg/dL — ABNORMAL HIGH (ref 0.3–1.2)
Total Protein: 7.9 g/dL (ref 6.5–8.1)

## 2019-03-05 LAB — FERRITIN: Ferritin: 4334 ng/mL — ABNORMAL HIGH (ref 24–336)

## 2019-03-06 ENCOUNTER — Telehealth: Payer: Self-pay | Admitting: Oncology

## 2019-03-06 NOTE — Telephone Encounter (Signed)
Labs are reviewed which showed increase of total bilirubin, predominantly direct bilirubin, possible liver failure. I called patient twice this morning to ask him to go to ER, but I am not able to reach him. Results were communicated with [facility] gastroenterology group as well.

## 2019-03-07 ENCOUNTER — Inpatient Hospital Stay: Payer: Managed Care, Other (non HMO)

## 2019-03-07 ENCOUNTER — Inpatient Hospital Stay: Payer: Managed Care, Other (non HMO) | Admitting: Oncology

## 2019-03-07 MED ORDER — VENLAFAXINE HCL ER 37.5 MG PO CP24
37.50 | ORAL_CAPSULE | ORAL | Status: DC
Start: 2019-03-08 — End: 2019-03-07

## 2019-03-07 MED ORDER — LIDOCAINE HCL 1 % IJ SOLN
0.50 | INTRAMUSCULAR | Status: DC
Start: ? — End: 2019-03-07

## 2019-03-07 MED ORDER — MELATONIN 3 MG PO TABS
3.00 | ORAL_TABLET | ORAL | Status: DC
Start: ? — End: 2019-03-07

## 2019-03-14 MED ORDER — PANTOPRAZOLE SODIUM 40 MG PO TBEC
40.00 | DELAYED_RELEASE_TABLET | ORAL | Status: DC
Start: 2019-03-13 — End: 2019-03-14

## 2019-03-14 MED ORDER — ALBUTEROL SULFATE 2.5 MG/0.5ML IN NEBU
2.50 | INHALATION_SOLUTION | RESPIRATORY_TRACT | Status: DC
Start: ? — End: 2019-03-14

## 2019-03-14 MED ORDER — OXYCODONE HCL 5 MG PO TABS
5.00 | ORAL_TABLET | ORAL | Status: DC
Start: ? — End: 2019-03-14

## 2019-03-14 MED ORDER — BENZONATATE 100 MG PO CAPS
100.00 | ORAL_CAPSULE | ORAL | Status: DC
Start: 2019-03-14 — End: 2019-03-14

## 2019-03-14 MED ORDER — AZITHROMYCIN 250 MG PO TABS
250.00 | ORAL_TABLET | ORAL | Status: DC
Start: 2019-03-14 — End: 2019-03-14

## 2019-03-14 MED ORDER — IBUPROFEN 400 MG PO TABS
800.00 | ORAL_TABLET | ORAL | Status: DC
Start: ? — End: 2019-03-14

## 2019-03-14 MED ORDER — LIDOCAINE 5 % EX PTCH
2.00 | MEDICATED_PATCH | CUTANEOUS | Status: DC
Start: 2019-03-14 — End: 2019-03-14

## 2019-03-14 MED ORDER — POLYETHYLENE GLYCOL 3350 17 G PO PACK
17.00 | PACK | ORAL | Status: DC
Start: ? — End: 2019-03-14

## 2019-03-14 MED ORDER — GENERIC EXTERNAL MEDICATION
100.00 | Status: DC
Start: 2019-03-14 — End: 2019-03-14

## 2019-03-14 MED ORDER — GENERIC EXTERNAL MEDICATION
1.00 | Status: DC
Start: ? — End: 2019-03-14

## 2019-03-20 ENCOUNTER — Other Ambulatory Visit: Payer: Self-pay | Admitting: Internal Medicine

## 2019-03-20 DIAGNOSIS — K769 Liver disease, unspecified: Secondary | ICD-10-CM

## 2019-03-25 ENCOUNTER — Other Ambulatory Visit: Payer: Self-pay | Admitting: Internal Medicine

## 2019-03-25 ENCOUNTER — Encounter
Admission: RE | Admit: 2019-03-25 | Discharge: 2019-03-25 | Disposition: A | Discharge status: Home / Self Care | Payer: Managed Care, Other (non HMO) | Source: Ambulatory Visit | Attending: Internal Medicine | Admitting: Internal Medicine

## 2019-03-25 ENCOUNTER — Other Ambulatory Visit: Payer: Self-pay

## 2019-03-25 DIAGNOSIS — R634 Abnormal weight loss: Secondary | ICD-10-CM | POA: Insufficient documentation

## 2019-03-25 DIAGNOSIS — K769 Liver disease, unspecified: Secondary | ICD-10-CM

## 2019-03-25 LAB — GLUCOSE, CAPILLARY: Glucose-Capillary: 81 mg/dL (ref 70–99)

## 2019-03-25 MED ORDER — FLUDEOXYGLUCOSE F - 18 (FDG) INJECTION
9.3000 | Freq: Once | INTRAVENOUS | Status: AC | PRN
  Administered 2019-03-25: 11:00:00 10.04 via INTRAVENOUS

## 2019-03-28 ENCOUNTER — Other Ambulatory Visit: Payer: Self-pay

## 2019-03-28 ENCOUNTER — Encounter
Admission: RE | Admit: 2019-03-28 | Discharge: 2019-03-28 | Disposition: A | Discharge status: Home / Self Care | Payer: Managed Care, Other (non HMO) | Source: Ambulatory Visit | Attending: Internal Medicine | Admitting: Internal Medicine

## 2019-03-28 DIAGNOSIS — K769 Liver disease, unspecified: Secondary | ICD-10-CM | POA: Insufficient documentation

## 2019-03-28 MED ORDER — TECHNETIUM TC 99M MEBROFENIN IV KIT
5.1400 | PACK | Freq: Once | INTRAVENOUS | Status: AC | PRN
  Administered 2019-03-28: 13:00:00 5.14 via INTRAVENOUS

## 2020-01-05 ENCOUNTER — Other Ambulatory Visit: Payer: Self-pay | Admitting: Internal Medicine

## 2020-01-05 ENCOUNTER — Ambulatory Visit
Admission: RE | Admit: 2020-01-05 | Discharge: 2020-01-05 | Disposition: A | Discharge status: Home / Self Care | Payer: Managed Care, Other (non HMO) | Source: Ambulatory Visit | Attending: Internal Medicine | Admitting: Internal Medicine

## 2020-01-05 ENCOUNTER — Other Ambulatory Visit: Payer: Self-pay

## 2020-01-05 ENCOUNTER — Other Ambulatory Visit (HOSPITAL_COMMUNITY): Payer: Self-pay | Admitting: Internal Medicine

## 2020-01-05 DIAGNOSIS — R519 Headache, unspecified: Secondary | ICD-10-CM | POA: Diagnosis not present

## 2021-04-14 IMAGING — CT CT ABDOMEN AND PELVIS WITH CONTRAST
1 of 3 series · 14 of 32 positions shown, 19 images · IV contrast (APPLIED)
Comparison: 10/02/2018 ultrasound. 08/15/2010 CT.

CLINICAL DATA: Abnormal liver function tests. Weight loss over 6
months. Evaluate for occult malignancy.

EXAM:
CT ABDOMEN AND PELVIS WITH CONTRAST
TECHNIQUE: Multidetector CT imaging of the abdomen and pelvis was performed
using the standard protocol following bolus administration of
intravenous contrast.
CONTRAST:  100mL OMNIPAQUE IOHEXOL 300 MG/ML  SOLN

[Series 2: axial st · axial · 0.83mm/px · z∈[-490,-5]mm · 14 of 111 slices shown, 19 images]
[im 7/111  soft-tissue]
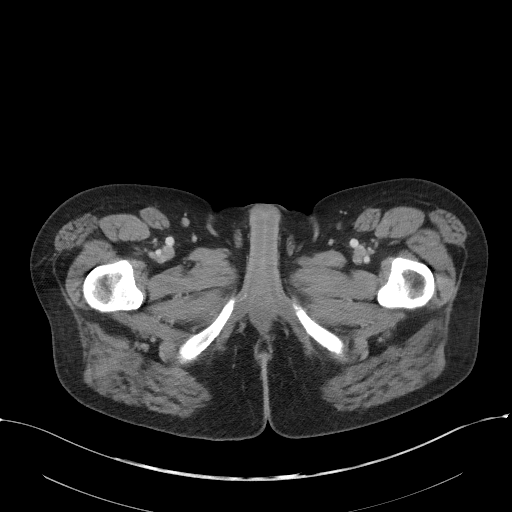
[im 7/111  bone]
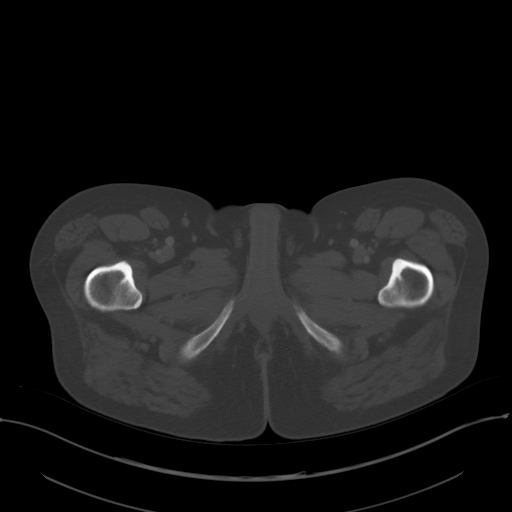
[im 13/111  soft-tissue]
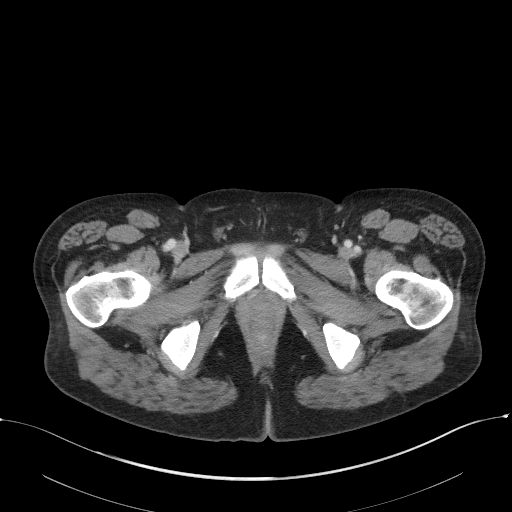
[im 25/111  soft-tissue]
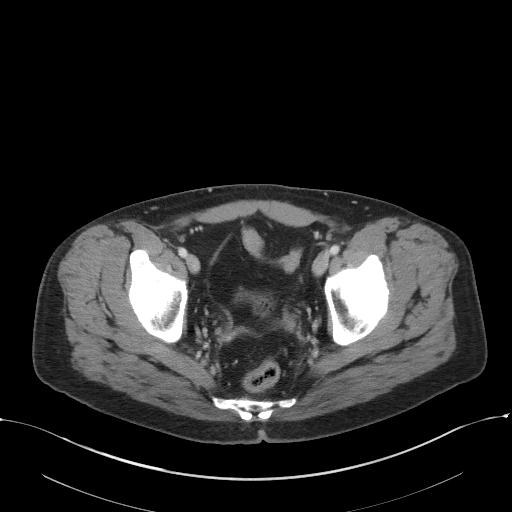
[im 31/111  soft-tissue]
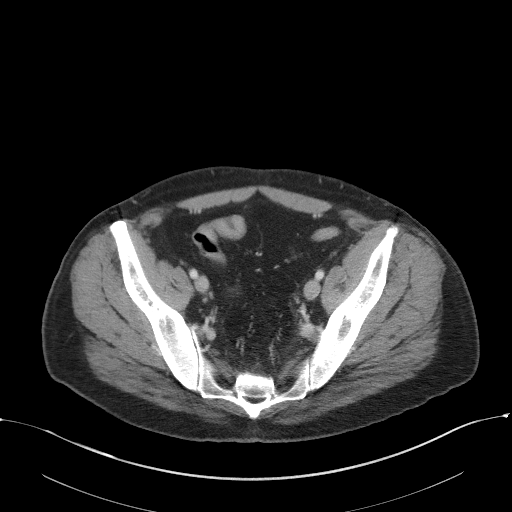
[im 37/111  soft-tissue]
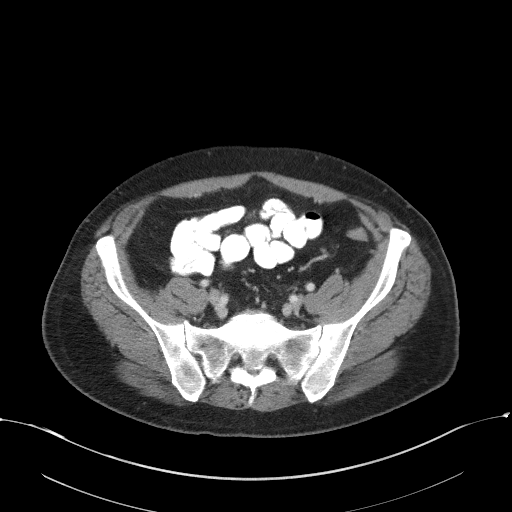
[im 49/111  soft-tissue]
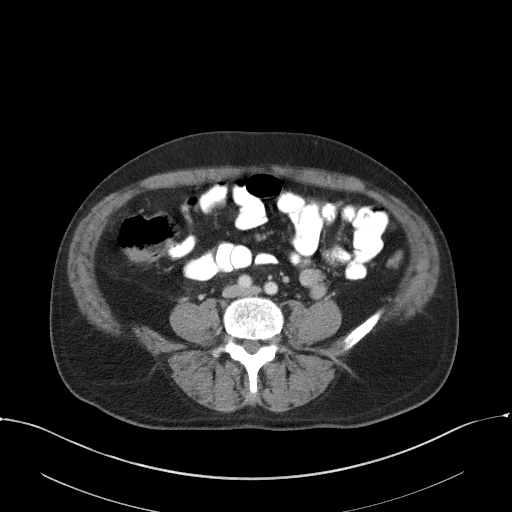
[im 56/111  soft-tissue]
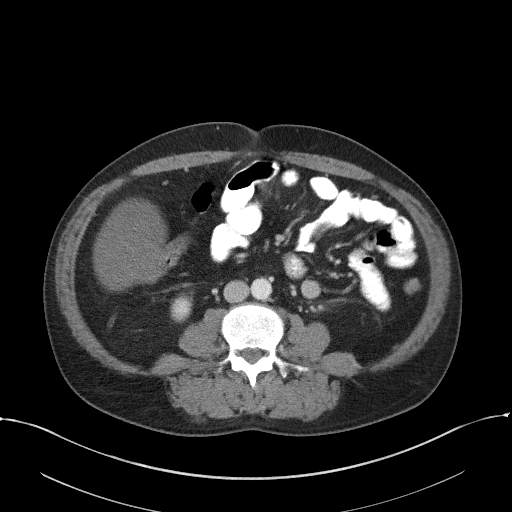
[im 62/111  soft-tissue]
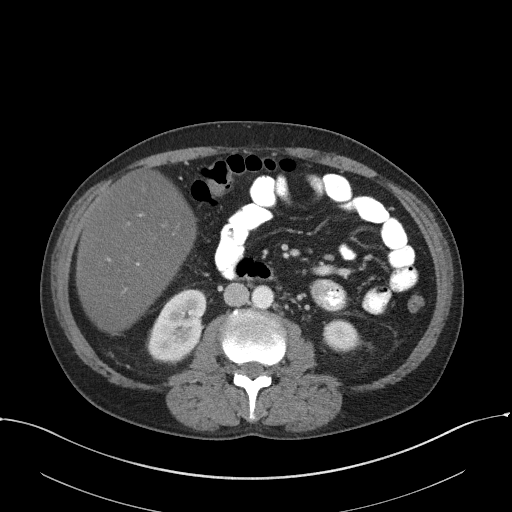
[im 74/111  soft-tissue]
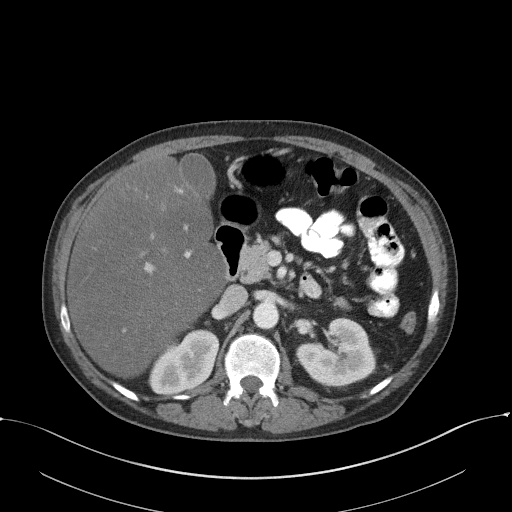
[im 74/111  bone]
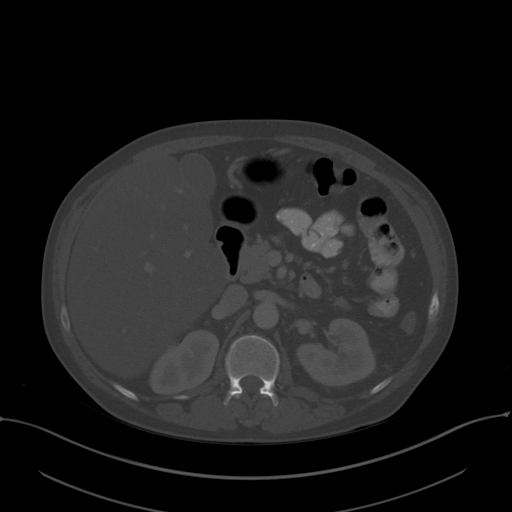
[im 80/111  soft-tissue]
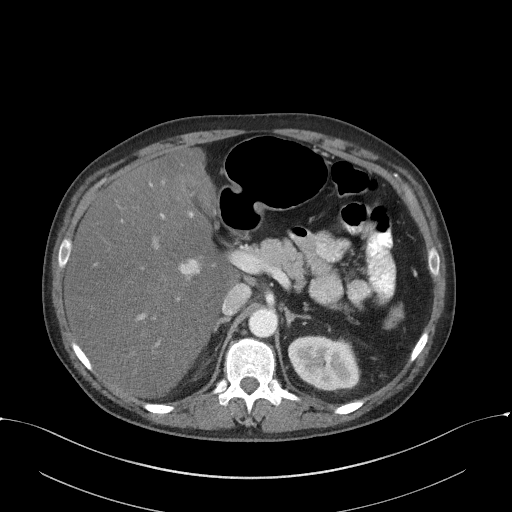
[im 86/111  soft-tissue]
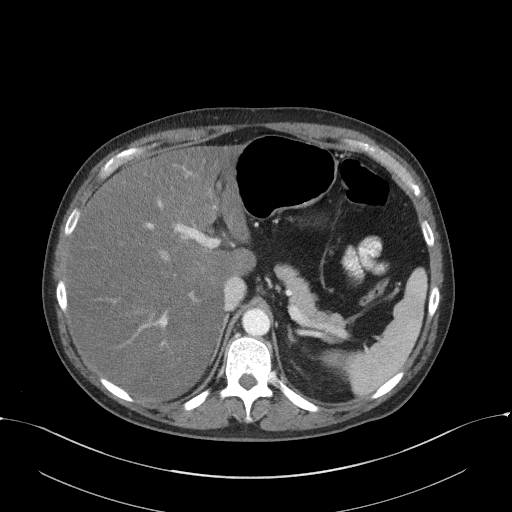
[im 86/111  lung]
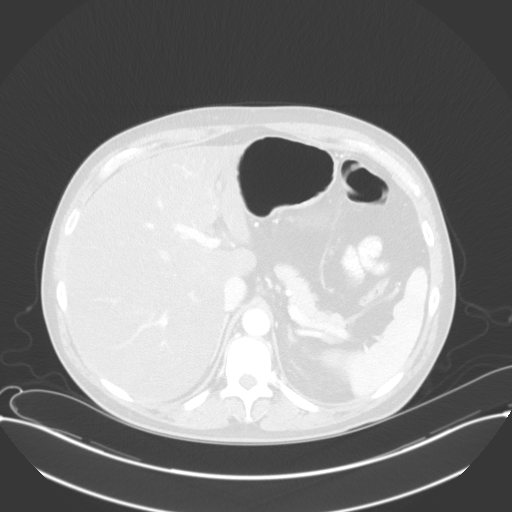
[im 92/111  lung]
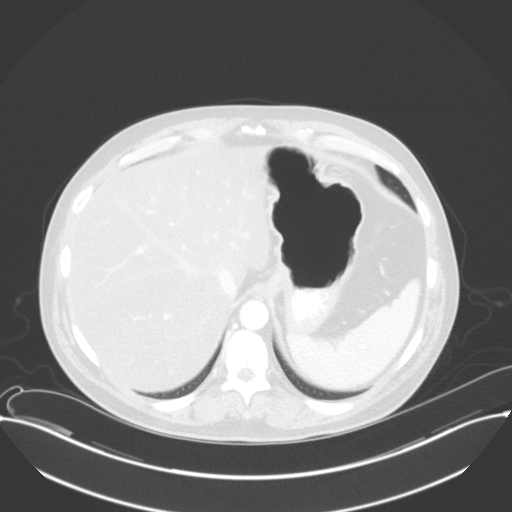
[im 98/111  soft-tissue]
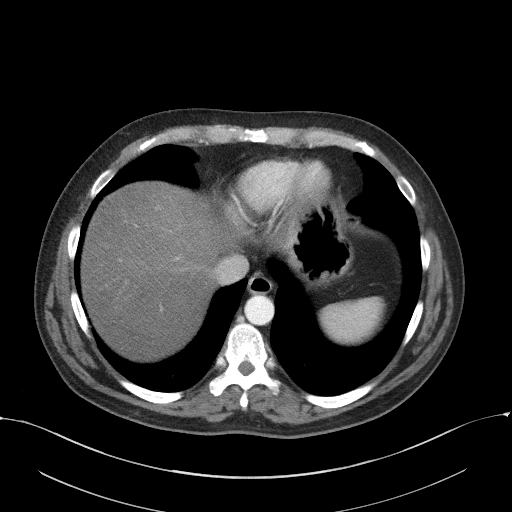
[im 98/111  lung]
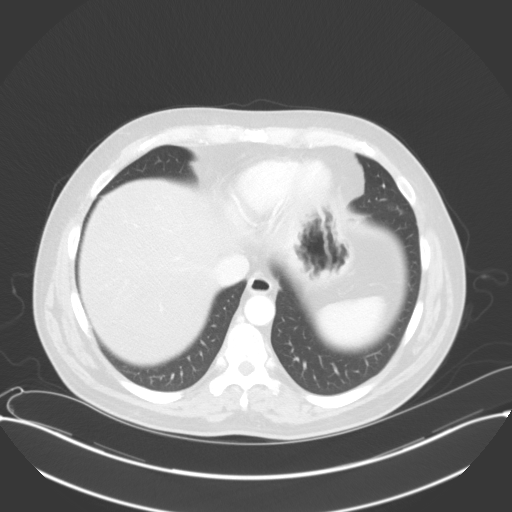
[im 104/111  soft-tissue]
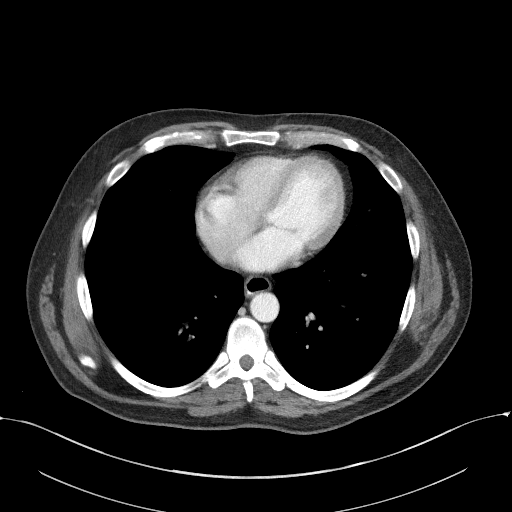
[im 104/111  lung]
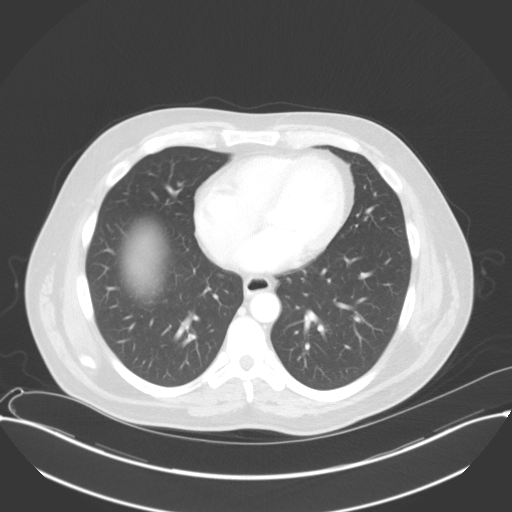

[14 of 32 positions shown; findings below may reference images not displayed]

FINDINGS: Lower chest: Clear lung bases. Normal heart size without pericardial
or pleural effusion. Fluid level in the distal esophagus.

Hepatobiliary: Marked hepatic steatosis and hepatomegaly at 25.2 cm
craniocaudal. No focal liver lesion. Normal gallbladder, without
biliary ductal dilatation.

Pancreas: Normal, without mass or ductal dilatation.

Spleen: Normal in size, without focal abnormality.

Adrenals/Urinary Tract: Normal adrenal glands. Normal kidneys,
without hydronephrosis. Normal urinary bladder.

Stomach/Bowel: Normal stomach, without wall thickening. Normal
colon, appendix, and terminal ileum. Normal small bowel.

Vascular/Lymphatic: Normal caliber of the aorta and branch vessels.
No abdominopelvic adenopathy.

Reproductive: Normal prostate.

Other: No significant free fluid.

Musculoskeletal: No acute osseous abnormality.
IMPRESSION: 1. No acute process in the abdomen or pelvis.
2. Marked hepatic steatosis and hepatomegaly.
3. Esophageal fluid level suggests dysmotility or gastroesophageal
reflux.

## 2023-06-15 ENCOUNTER — Inpatient Hospital Stay (HOSPITAL_COMMUNITY): Admission: AD | Admit: 2023-06-15 | Payer: Self-pay | Source: Intra-hospital

## 2023-06-15 ENCOUNTER — Inpatient Hospital Stay
Admission: EM | Admit: 2023-06-15 | Discharge: 2023-06-30 | DRG: 896 | Disposition: A | Discharge status: Other Acute Inpatient Hospital | Payer: Managed Care, Other (non HMO) | Attending: Internal Medicine | Admitting: Internal Medicine

## 2023-06-15 ENCOUNTER — Other Ambulatory Visit: Payer: Self-pay

## 2023-06-15 DIAGNOSIS — Z5986 Financial insecurity: Secondary | ICD-10-CM

## 2023-06-15 DIAGNOSIS — K219 Gastro-esophageal reflux disease without esophagitis: Secondary | ICD-10-CM | POA: Insufficient documentation

## 2023-06-15 DIAGNOSIS — D75839 Thrombocytosis, unspecified: Secondary | ICD-10-CM | POA: Diagnosis not present

## 2023-06-15 DIAGNOSIS — Z6832 Body mass index (BMI) 32.0-32.9, adult: Secondary | ICD-10-CM

## 2023-06-15 DIAGNOSIS — A09 Infectious gastroenteritis and colitis, unspecified: Secondary | ICD-10-CM | POA: Diagnosis present

## 2023-06-15 DIAGNOSIS — E66811 Obesity, class 1: Secondary | ICD-10-CM | POA: Diagnosis present

## 2023-06-15 DIAGNOSIS — F10931 Alcohol use, unspecified with withdrawal delirium: Secondary | ICD-10-CM

## 2023-06-15 DIAGNOSIS — Z781 Physical restraint status: Secondary | ICD-10-CM

## 2023-06-15 DIAGNOSIS — K760 Fatty (change of) liver, not elsewhere classified: Secondary | ICD-10-CM | POA: Diagnosis present

## 2023-06-15 DIAGNOSIS — F10231 Alcohol dependence with withdrawal delirium: Secondary | ICD-10-CM | POA: Diagnosis not present

## 2023-06-15 DIAGNOSIS — F32A Depression, unspecified: Secondary | ICD-10-CM

## 2023-06-15 DIAGNOSIS — D72829 Elevated white blood cell count, unspecified: Secondary | ICD-10-CM

## 2023-06-15 DIAGNOSIS — Z8249 Family history of ischemic heart disease and other diseases of the circulatory system: Secondary | ICD-10-CM

## 2023-06-15 DIAGNOSIS — I10 Essential (primary) hypertension: Secondary | ICD-10-CM | POA: Diagnosis present

## 2023-06-15 DIAGNOSIS — E876 Hypokalemia: Secondary | ICD-10-CM

## 2023-06-15 DIAGNOSIS — R197 Diarrhea, unspecified: Secondary | ICD-10-CM

## 2023-06-15 DIAGNOSIS — Z79899 Other long term (current) drug therapy: Secondary | ICD-10-CM

## 2023-06-15 DIAGNOSIS — R45851 Suicidal ideations: Principal | ICD-10-CM

## 2023-06-15 DIAGNOSIS — Z811 Family history of alcohol abuse and dependence: Secondary | ICD-10-CM

## 2023-06-15 DIAGNOSIS — K567 Ileus, unspecified: Secondary | ICD-10-CM | POA: Diagnosis present

## 2023-06-15 DIAGNOSIS — G9341 Metabolic encephalopathy: Secondary | ICD-10-CM | POA: Diagnosis present

## 2023-06-15 DIAGNOSIS — T426X5A Adverse effect of other antiepileptic and sedative-hypnotic drugs, initial encounter: Secondary | ICD-10-CM | POA: Diagnosis not present

## 2023-06-15 DIAGNOSIS — Y908 Blood alcohol level of 240 mg/100 ml or more: Secondary | ICD-10-CM | POA: Diagnosis present

## 2023-06-15 DIAGNOSIS — E871 Hypo-osmolality and hyponatremia: Secondary | ICD-10-CM

## 2023-06-15 DIAGNOSIS — R509 Fever, unspecified: Secondary | ICD-10-CM

## 2023-06-15 DIAGNOSIS — I952 Hypotension due to drugs: Secondary | ICD-10-CM | POA: Diagnosis not present

## 2023-06-15 DIAGNOSIS — K56609 Unspecified intestinal obstruction, unspecified as to partial versus complete obstruction: Secondary | ICD-10-CM | POA: Insufficient documentation

## 2023-06-15 DIAGNOSIS — R4182 Altered mental status, unspecified: Secondary | ICD-10-CM | POA: Diagnosis not present

## 2023-06-15 LAB — CBC
HCT: 46.4 % (ref 39.0–52.0)
Hemoglobin: 15.9 g/dL (ref 13.0–17.0)
MCH: 30.1 pg (ref 26.0–34.0)
MCHC: 34.3 g/dL (ref 30.0–36.0)
MCV: 87.9 fL (ref 80.0–100.0)
Platelets: 366 10*3/uL (ref 150–400)
RBC: 5.28 MIL/uL (ref 4.22–5.81)
RDW: 13.6 % (ref 11.5–15.5)
WBC: 15.6 10*3/uL — ABNORMAL HIGH (ref 4.0–10.5)
nRBC: 0 % (ref 0.0–0.2)

## 2023-06-15 LAB — COMPREHENSIVE METABOLIC PANEL WITH GFR
ALT: 35 U/L (ref 0–44)
AST: 62 U/L — ABNORMAL HIGH (ref 15–41)
Albumin: 5.1 g/dL — ABNORMAL HIGH (ref 3.5–5.0)
Alkaline Phosphatase: 96 U/L (ref 38–126)
Anion gap: 23 — ABNORMAL HIGH (ref 5–15)
BUN: 13 mg/dL (ref 6–20)
CO2: 20 mmol/L — ABNORMAL LOW (ref 22–32)
Calcium: 8.9 mg/dL (ref 8.9–10.3)
Chloride: 91 mmol/L — ABNORMAL LOW (ref 98–111)
Creatinine, Ser: 0.95 mg/dL (ref 0.61–1.24)
GFR, Estimated: >60 mL/min
Glucose, Bld: 104 mg/dL — ABNORMAL HIGH (ref 70–99)
Potassium: 3.7 mmol/L (ref 3.5–5.1)
Sodium: 134 mmol/L — ABNORMAL LOW (ref 135–145)
Total Bilirubin: 1.6 mg/dL — ABNORMAL HIGH
Total Protein: 8.7 g/dL — ABNORMAL HIGH (ref 6.5–8.1)

## 2023-06-15 LAB — ACETAMINOPHEN LEVEL: Acetaminophen (Tylenol), Serum: <10 ug/mL — ABNORMAL LOW (ref 10–30)

## 2023-06-15 LAB — ETHANOL: Alcohol, Ethyl (B): 365 mg/dL

## 2023-06-15 LAB — SALICYLATE LEVEL: Salicylate Lvl: <7 mg/dL — ABNORMAL LOW (ref 7.0–30.0)

## 2023-06-15 NOTE — ED Notes (Signed)
IVC/Accepted to [facility] at [facility]
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
## 2023-06-15 NOTE — ED Notes (Signed)
Hospital meal provided, pt tolerated w/o complaints.  Waste discarded appropriately.  

## 2023-06-15 NOTE — ED Notes (Signed)
Pt vitals taken and offered snack to pt at this time. Pt refused snack. This EDT told pt to let her know if he wanted a snack later on.

## 2023-06-15 NOTE — ED Notes (Signed)
IVC 
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
## 2023-06-15 NOTE — ED Notes (Signed)
Patient has been accepted to [facility].  Patient assigned to room 301-1 Accepting physician is Nanine Means, NP.  Call report to [phone].  Representative was [employer].   ER Staff is aware of it:  [facility] ER Secretary  Dr. Katrinka Blazing, ER MD  Shawna Orleans Patient's Nurse     Patient can arrive tomorrow morning after 9:30 a.m.
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
## 2023-06-16 MED ORDER — SODIUM CHLORIDE 0.9 % IV BOLUS
1000.0000 mL | Freq: Once | INTRAVENOUS | Status: AC
  Administered 2023-06-16: 1000 mL via INTRAVENOUS

## 2023-06-16 MED ORDER — LOPERAMIDE HCL 2 MG PO CAPS: 2.0000 mg | ORAL_CAPSULE | ORAL | Status: AC | PRN

## 2023-06-16 MED ORDER — THIAMINE HCL 100 MG/ML IJ SOLN
100.0000 mg | Freq: Every day | INTRAMUSCULAR | Status: DC
  Administered 2023-06-18 – 2023-06-19 (×2): 100 mg via INTRAVENOUS
  Filled 2023-06-16 (×2): qty 2
  Filled 2023-06-16: qty 1

## 2023-06-16 MED ORDER — ONDANSETRON 4 MG PO TBDP: 4.0000 mg | ORAL_TABLET | Freq: Four times a day (QID) | ORAL | Status: AC | PRN

## 2023-06-16 MED ORDER — LORAZEPAM 1 MG PO TABS
1.0000 mg | ORAL_TABLET | Freq: Four times a day (QID) | ORAL | Status: DC | PRN
  Administered 2023-06-16: 1 mg via ORAL
  Filled 2023-06-16: qty 1

## 2023-06-16 MED ORDER — THIAMINE MONONITRATE 100 MG PO TABS: 100.0000 mg | ORAL_TABLET | Freq: Every day | ORAL | Status: DC

## 2023-06-16 MED ORDER — PHENOBARBITAL SODIUM 65 MG/ML IJ SOLN
130.0000 mg | Freq: Once | INTRAMUSCULAR | Status: AC
  Administered 2023-06-16: 130 mg via INTRAMUSCULAR
  Filled 2023-06-16: qty 2

## 2023-06-16 MED ORDER — LORAZEPAM 2 MG PO TABS: 0.0000 mg | ORAL_TABLET | Freq: Two times a day (BID) | ORAL | Status: DC

## 2023-06-16 MED ORDER — OLANZAPINE 10 MG PO TBDP
10.0000 mg | ORAL_TABLET | Freq: Three times a day (TID) | ORAL | Status: DC | PRN
  Administered 2023-06-17 – 2023-06-25 (×4): 10 mg via ORAL
  Filled 2023-06-16 (×8): qty 1

## 2023-06-16 MED ORDER — LORAZEPAM 1 MG PO TABS
1.0000 mg | ORAL_TABLET | Freq: Two times a day (BID) | ORAL | Status: DC
Start: 2023-06-18 — End: 2023-06-18

## 2023-06-16 MED ORDER — LORAZEPAM 2 MG PO TABS
0.0000 mg | ORAL_TABLET | Freq: Four times a day (QID) | ORAL | Status: AC
  Administered 2023-06-16: 2 mg via ORAL
  Filled 2023-06-16: qty 1

## 2023-06-16 MED ORDER — METOPROLOL TARTRATE 50 MG PO TABS
50.0000 mg | ORAL_TABLET | Freq: Once | ORAL | Status: AC
  Administered 2023-06-16: 50 mg via ORAL
  Filled 2023-06-16 (×2): qty 1

## 2023-06-16 MED ORDER — LORAZEPAM 1 MG PO TABS
1.0000 mg | ORAL_TABLET | Freq: Three times a day (TID) | ORAL | Status: AC
Start: 2023-06-17 — End: 2023-06-18
  Administered 2023-06-17 (×3): 1 mg via ORAL
  Filled 2023-06-16 (×3): qty 1

## 2023-06-16 MED ORDER — ADULT MULTIVITAMIN W/MINERALS CH
1.0000 | ORAL_TABLET | Freq: Every day | ORAL | Status: DC
  Administered 2023-06-16 – 2023-06-25 (×5): 1 via ORAL
  Filled 2023-06-16 (×5): qty 1

## 2023-06-16 MED ORDER — HYDROXYZINE HCL 25 MG PO TABS: 25.0000 mg | ORAL_TABLET | Freq: Four times a day (QID) | ORAL | Status: AC | PRN

## 2023-06-16 MED ORDER — LORAZEPAM 2 MG PO TABS
2.0000 mg | ORAL_TABLET | ORAL | Status: AC
  Filled 2023-06-16: qty 1

## 2023-06-16 MED ORDER — LORAZEPAM 2 MG/ML IJ SOLN
2.0000 mg | INTRAMUSCULAR | Status: AC
  Administered 2023-06-16: 2 mg via INTRAVENOUS
  Filled 2023-06-16: qty 1

## 2023-06-16 MED ORDER — OLANZAPINE 5 MG PO TABS: 5.0000 mg | ORAL_TABLET | Freq: Every day | ORAL | Status: DC

## 2023-06-16 MED ORDER — OLANZAPINE 5 MG PO TABS
5.0000 mg | ORAL_TABLET | ORAL | Status: AC
  Administered 2023-06-16: 5 mg via ORAL
  Filled 2023-06-16: qty 1

## 2023-06-16 MED ORDER — LORAZEPAM 1 MG PO TABS: 1.0000 mg | ORAL_TABLET | Freq: Every day | ORAL | Status: DC

## 2023-06-16 MED ORDER — HYDRALAZINE HCL 20 MG/ML IJ SOLN
10.0000 mg | Freq: Once | INTRAMUSCULAR | Status: AC
  Administered 2023-06-16: 10 mg via INTRAVENOUS
  Filled 2023-06-16: qty 1

## 2023-06-16 MED ORDER — DIAZEPAM 5 MG/ML IJ SOLN
10.0000 mg | Freq: Once | INTRAMUSCULAR | Status: AC
  Administered 2023-06-16: 10 mg via INTRAVENOUS
  Filled 2023-06-16: qty 2

## 2023-06-16 MED ORDER — LORAZEPAM 2 MG/ML IJ SOLN
0.0000 mg | Freq: Four times a day (QID) | INTRAMUSCULAR | Status: AC
  Administered 2023-06-17: 4 mg via INTRAVENOUS
  Filled 2023-06-16 (×4): qty 1

## 2023-06-16 MED ORDER — ZIPRASIDONE MESYLATE 20 MG IM SOLR
20.0000 mg | INTRAMUSCULAR | Status: AC | PRN
  Administered 2023-06-17: 20 mg via INTRAMUSCULAR
  Filled 2023-06-16: qty 20

## 2023-06-16 MED ORDER — THIAMINE MONONITRATE 100 MG PO TABS
100.0000 mg | ORAL_TABLET | Freq: Every day | ORAL | Status: DC
  Administered 2023-06-16 – 2023-06-17 (×2): 100 mg via ORAL
  Filled 2023-06-16 (×2): qty 1

## 2023-06-16 MED ORDER — LORAZEPAM 2 MG/ML IJ SOLN: 0.0000 mg | Freq: Two times a day (BID) | INTRAMUSCULAR | Status: DC

## 2023-06-16 MED ORDER — ONDANSETRON 4 MG PO TBDP
4.0000 mg | ORAL_TABLET | Freq: Once | ORAL | Status: AC
  Administered 2023-06-16: 4 mg via ORAL
  Filled 2023-06-16: qty 1

## 2023-06-16 MED ORDER — LORAZEPAM 1 MG PO TABS
1.0000 mg | ORAL_TABLET | Freq: Four times a day (QID) | ORAL | Status: DC
  Administered 2023-06-16 (×2): 1 mg via ORAL
  Filled 2023-06-16 (×3): qty 1

## 2023-06-16 MED ORDER — THIAMINE HCL 100 MG/ML IJ SOLN
100.0000 mg | Freq: Once | INTRAMUSCULAR | Status: AC
Start: 2023-06-16 — End: 2023-06-16
  Administered 2023-06-16: 100 mg via INTRAMUSCULAR
  Filled 2023-06-16: qty 2

## 2023-06-16 MED ORDER — LORAZEPAM 1 MG PO TABS
1.0000 mg | ORAL_TABLET | ORAL | Status: AC | PRN
  Administered 2023-06-17: 1 mg via ORAL

## 2023-06-16 MED ORDER — METOPROLOL TARTRATE 12.5 MG HALF TABLET: 12.5000 mg | ORAL_TABLET | ORAL | Status: DC

## 2023-06-16 NOTE — ED Notes (Signed)
Pt given water. Pt states he is still having visual images when he closes his eyes but his headache has improved. Pt denies any immediate needs and is calm/cooperative.

## 2023-06-16 NOTE — ED Notes (Signed)
Pt is asleep. Equal rise and fall of chest noted. NAD.

## 2023-06-16 NOTE — ED Notes (Signed)
MD stafford made aware of tachycardia. MD in room for assessment.

## 2023-06-16 NOTE — ED Notes (Signed)
Wife Dawn called for a update.

## 2023-06-16 NOTE — ED Notes (Signed)
Pt reminded to not get up without assistance. Pt has wobbly gate and attempted to touch visual hallucination on the wall. Pt is calm and cooperative and denies any pain.

## 2023-06-16 NOTE — ED Notes (Addendum)
[place] [facility] here for transfer

## 2023-06-16 NOTE — ED Notes (Signed)
Discontinue Hypertensive meds per MD verbal order

## 2023-06-16 NOTE — ED Notes (Signed)
Pt cx of nausea and stated he saw "cartoon characters and people when I close my eyes". Visible tremors were noted. MD made aware.

## 2023-06-16 NOTE — ED Notes (Signed)
Md notified of pt tachycardia and elevated B/P.

## 2023-06-16 NOTE — ED Notes (Signed)
Wife called for update. Pt gave this nurse permission to discuss medical information with his wife.

## 2023-06-16 NOTE — ED Provider Notes (Signed)
Procedures     ----------------------------------------- 5:32 PM on 06/16/2023 ----------------------------------------- Pt still feeling anxious. Face flushed. Tachycardic at 130, hypertensive still.  Has received 260 phenobarb + intermittent benzos: 11/29    1651   Arrived  1657   Comprehensive metabolic panel     Salicylate level     Acetaminophen level     Ethanol     cbc   11/30    0401   PHENobarbital Sodium 130 mg  0846   PHENobarbital Sodium 130 mg  0847   Ondansetron 4 mg  1038   Multiple Vitamin,Multiple Vitamins-Minerals 1 tablet    LORazepam 1 mg  1039   Thiamine HCl 100 mg  1242   LORazepam 1 mg  1355   LORazepam 1 mg    OLANZapine 5 mg  1604   hydrALAZINE HCl 10 mg  1606   LORazepam 2 mg    Suspect he is still experiencing AWS, will add additional 10mg  IV valium. May benefit from IVF bolus as well, will order NS bolus.     Sharman Cheek, MD 06/16/23 [phone]

## 2023-06-16 NOTE — ED Notes (Signed)
Pt denied transfer due to concerns of DT and elevated CIWA.

## 2023-06-16 NOTE — ED Notes (Signed)
Pt has been moved to [facility]

## 2023-06-16 NOTE — ED Notes (Signed)
This nurse called [facility] @ [facility] for report to [place]. New set of vitals requested prior to transfer per [facility].

## 2023-06-16 NOTE — Progress Notes (Signed)
Writer notify by patient nurse that he was having Hallucination when he open his eyes.  One time dose of Zyprexa 5 mg ordered. RN to notify write of any changes

## 2023-06-16 NOTE — ED Provider Notes (Addendum)
-----------------------------------------   9:37 AM on 06/16/2023 -----------------------------------------   Blood pressure (!) 154/112, pulse (!) 121, temperature 97.8 F (36.6 C), temperature source Oral, resp. rate 19, SpO2 96%.  The patient is calm and cooperative at this time.  Patient admitted to [facility]  Addendum: Patient was being transferred to [facility].  Psychiatry over that are called and stated that his CIWA is too high and that he would need to be treated further in the emergency department for lower CIWA until he could be safely discharged.   Janith Lima, MD 06/16/23 1610    Janith Lima, MD 06/16/23 [phone]

## 2023-06-16 NOTE — ED Notes (Addendum)
During hourly rounding, upon assessment pt was found staring at wall. Pt stated " I now see them ( hallucinations) when I open my eyes, I am watching a foot ball game. I don't know these people" CIWA reassessed. Pt is calm and cooperative and declined eating lunch.

## 2023-06-16 NOTE — Progress Notes (Signed)
Writer notify by RN  the following  pt information " 171/131 and 140 HR. Its been a hour since he has received his 1400 dose of ativan and Zyprexa.  New order give for 2 mg ativan and hydralazine 10 mg, (give IV if patient has access, if access is not possible give P.O). RN to Lexicographer of patient progress.

## 2023-06-16 NOTE — ED Notes (Signed)
EMTALA Reviewed by this RN.  

## 2023-06-16 NOTE — ED Notes (Signed)
Pt offered breakfast. Pt denied. No new needs expressed at this time

## 2023-06-16 NOTE — ED Notes (Signed)
Patient was rejected from transport to [facility] until medical cleared

## 2023-06-17 ENCOUNTER — Emergency Department: Payer: Managed Care, Other (non HMO)

## 2023-06-17 DIAGNOSIS — F32A Depression, unspecified: Secondary | ICD-10-CM

## 2023-06-17 DIAGNOSIS — R509 Fever, unspecified: Secondary | ICD-10-CM | POA: Diagnosis not present

## 2023-06-17 DIAGNOSIS — K219 Gastro-esophageal reflux disease without esophagitis: Secondary | ICD-10-CM | POA: Insufficient documentation

## 2023-06-17 DIAGNOSIS — F10931 Alcohol use, unspecified with withdrawal delirium: Secondary | ICD-10-CM | POA: Diagnosis not present

## 2023-06-17 DIAGNOSIS — E871 Hypo-osmolality and hyponatremia: Secondary | ICD-10-CM | POA: Diagnosis present

## 2023-06-17 DIAGNOSIS — R4182 Altered mental status, unspecified: Secondary | ICD-10-CM | POA: Diagnosis present

## 2023-06-17 DIAGNOSIS — I10 Essential (primary) hypertension: Secondary | ICD-10-CM | POA: Diagnosis present

## 2023-06-17 DIAGNOSIS — D72829 Elevated white blood cell count, unspecified: Secondary | ICD-10-CM

## 2023-06-17 DIAGNOSIS — Z6832 Body mass index (BMI) 32.0-32.9, adult: Secondary | ICD-10-CM | POA: Diagnosis not present

## 2023-06-17 DIAGNOSIS — R140 Abdominal distension (gaseous): Secondary | ICD-10-CM | POA: Diagnosis not present

## 2023-06-17 DIAGNOSIS — K567 Ileus, unspecified: Secondary | ICD-10-CM | POA: Diagnosis present

## 2023-06-17 DIAGNOSIS — E66811 Obesity, class 1: Secondary | ICD-10-CM | POA: Diagnosis present

## 2023-06-17 DIAGNOSIS — Z8249 Family history of ischemic heart disease and other diseases of the circulatory system: Secondary | ICD-10-CM | POA: Diagnosis not present

## 2023-06-17 DIAGNOSIS — T426X5A Adverse effect of other antiepileptic and sedative-hypnotic drugs, initial encounter: Secondary | ICD-10-CM | POA: Diagnosis not present

## 2023-06-17 DIAGNOSIS — E876 Hypokalemia: Secondary | ICD-10-CM | POA: Diagnosis present

## 2023-06-17 DIAGNOSIS — Z781 Physical restraint status: Secondary | ICD-10-CM | POA: Diagnosis not present

## 2023-06-17 DIAGNOSIS — Z811 Family history of alcohol abuse and dependence: Secondary | ICD-10-CM | POA: Diagnosis not present

## 2023-06-17 DIAGNOSIS — A09 Infectious gastroenteritis and colitis, unspecified: Secondary | ICD-10-CM | POA: Diagnosis present

## 2023-06-17 DIAGNOSIS — R45851 Suicidal ideations: Secondary | ICD-10-CM | POA: Diagnosis present

## 2023-06-17 DIAGNOSIS — R197 Diarrhea, unspecified: Secondary | ICD-10-CM | POA: Diagnosis not present

## 2023-06-17 DIAGNOSIS — Z79899 Other long term (current) drug therapy: Secondary | ICD-10-CM | POA: Diagnosis not present

## 2023-06-17 DIAGNOSIS — K760 Fatty (change of) liver, not elsewhere classified: Secondary | ICD-10-CM | POA: Diagnosis present

## 2023-06-17 DIAGNOSIS — K56609 Unspecified intestinal obstruction, unspecified as to partial versus complete obstruction: Secondary | ICD-10-CM | POA: Diagnosis not present

## 2023-06-17 DIAGNOSIS — R5081 Fever presenting with conditions classified elsewhere: Secondary | ICD-10-CM | POA: Diagnosis not present

## 2023-06-17 DIAGNOSIS — F10231 Alcohol dependence with withdrawal delirium: Secondary | ICD-10-CM | POA: Diagnosis present

## 2023-06-17 DIAGNOSIS — D75839 Thrombocytosis, unspecified: Secondary | ICD-10-CM | POA: Diagnosis not present

## 2023-06-17 DIAGNOSIS — G9341 Metabolic encephalopathy: Secondary | ICD-10-CM | POA: Diagnosis present

## 2023-06-17 DIAGNOSIS — Z5986 Financial insecurity: Secondary | ICD-10-CM | POA: Diagnosis not present

## 2023-06-17 DIAGNOSIS — Y908 Blood alcohol level of 240 mg/100 ml or more: Secondary | ICD-10-CM | POA: Diagnosis present

## 2023-06-17 DIAGNOSIS — I952 Hypotension due to drugs: Secondary | ICD-10-CM | POA: Diagnosis not present

## 2023-06-17 LAB — CBC
HCT: 39.3 % (ref 39.0–52.0)
HCT: 42 % (ref 39.0–52.0)
Hemoglobin: 13.6 g/dL (ref 13.0–17.0)
Hemoglobin: 14.3 g/dL (ref 13.0–17.0)
MCH: 29.7 pg (ref 26.0–34.0)
MCH: 29.9 pg (ref 26.0–34.0)
MCHC: 34 g/dL (ref 30.0–36.0)
MCHC: 34.6 g/dL (ref 30.0–36.0)
MCV: 86.4 fL (ref 80.0–100.0)
MCV: 87.3 fL (ref 80.0–100.0)
Platelets: 245 10*3/uL (ref 150–400)
Platelets: 256 10*3/uL (ref 150–400)
RBC: 4.55 MIL/uL (ref 4.22–5.81)
RBC: 4.81 MIL/uL (ref 4.22–5.81)
RDW: 13.4 % (ref 11.5–15.5)
RDW: 13.4 % (ref 11.5–15.5)
WBC: 11.3 10*3/uL — ABNORMAL HIGH (ref 4.0–10.5)
WBC: 13.2 10*3/uL — ABNORMAL HIGH (ref 4.0–10.5)
nRBC: 0 % (ref 0.0–0.2)
nRBC: 0 % (ref 0.0–0.2)

## 2023-06-17 LAB — URINALYSIS, COMPLETE (UACMP) WITH MICROSCOPIC
Bacteria, UA: NONE SEEN
Bilirubin Urine: NEGATIVE
Glucose, UA: NEGATIVE mg/dL
Hgb urine dipstick: NEGATIVE
Ketones, ur: 20 mg/dL — AB
Leukocytes,Ua: NEGATIVE
Nitrite: NEGATIVE
Protein, ur: NEGATIVE mg/dL
Specific Gravity, Urine: 1.021 (ref 1.005–1.030)
Squamous Epithelial / HPF: 0 /[HPF] (ref 0–5)
pH: 6 (ref 5.0–8.0)

## 2023-06-17 LAB — URINE DRUG SCREEN, QUALITATIVE (ARMC ONLY)
Amphetamines, Ur Screen: NOT DETECTED
Barbiturates, Ur Screen: POSITIVE — AB
Benzodiazepine, Ur Scrn: POSITIVE — AB
Cannabinoid 50 Ng, Ur ~~LOC~~: NOT DETECTED
Cocaine Metabolite,Ur ~~LOC~~: NOT DETECTED
MDMA (Ecstasy)Ur Screen: NOT DETECTED
Methadone Scn, Ur: NOT DETECTED
Opiate, Ur Screen: NOT DETECTED
Phencyclidine (PCP) Ur S: NOT DETECTED
Tricyclic, Ur Screen: POSITIVE — AB

## 2023-06-17 LAB — COMPREHENSIVE METABOLIC PANEL
ALT: 33 U/L (ref 0–44)
AST: 63 U/L — ABNORMAL HIGH (ref 15–41)
Albumin: 4.2 g/dL (ref 3.5–5.0)
Alkaline Phosphatase: 92 U/L (ref 38–126)
Anion gap: 13 (ref 5–15)
BUN: 18 mg/dL (ref 6–20)
CO2: 24 mmol/L (ref 22–32)
Calcium: 8.7 mg/dL — ABNORMAL LOW (ref 8.9–10.3)
Chloride: 92 mmol/L — ABNORMAL LOW (ref 98–111)
Creatinine, Ser: 0.89 mg/dL (ref 0.61–1.24)
GFR, Estimated: >60 mL/min (ref >60)
Glucose, Bld: 108 mg/dL — ABNORMAL HIGH (ref 70–99)
Potassium: 3.1 mmol/L — ABNORMAL LOW (ref 3.5–5.1)
Sodium: 129 mmol/L — ABNORMAL LOW (ref 135–145)
Total Bilirubin: 2.9 mg/dL — ABNORMAL HIGH (ref <1.2)
Total Protein: 7.2 g/dL (ref 6.5–8.1)

## 2023-06-17 LAB — BASIC METABOLIC PANEL
Anion gap: 11 (ref 5–15)
Anion gap: 7 (ref 5–15)
BUN: 15 mg/dL (ref 6–20)
BUN: 13 mg/dL (ref 6–20)
CO2: 26 mmol/L (ref 22–32)
CO2: 25 mmol/L (ref 22–32)
Calcium: 8.6 mg/dL — ABNORMAL LOW (ref 8.9–10.3)
Calcium: 8.6 mg/dL — ABNORMAL LOW (ref 8.9–10.3)
Chloride: 95 mmol/L — ABNORMAL LOW (ref 98–111)
Chloride: 98 mmol/L (ref 98–111)
Creatinine, Ser: 0.91 mg/dL (ref 0.61–1.24)
Creatinine, Ser: 0.78 mg/dL (ref 0.61–1.24)
GFR, Estimated: >60 mL/min (ref >60)
GFR, Estimated: >60 mL/min (ref >60)
Glucose, Bld: 100 mg/dL — ABNORMAL HIGH (ref 70–99)
Glucose, Bld: 142 mg/dL — ABNORMAL HIGH (ref 70–99)
Potassium: 2.7 mmol/L — CL (ref 3.5–5.1)
Potassium: 3.7 mmol/L (ref 3.5–5.1)
Sodium: 132 mmol/L — ABNORMAL LOW (ref 135–145)
Sodium: 130 mmol/L — ABNORMAL LOW (ref 135–145)

## 2023-06-17 LAB — GLUCOSE, CAPILLARY: Glucose-Capillary: 86 mg/dL (ref 70–99)

## 2023-06-17 LAB — MAGNESIUM: Magnesium: 2.5 mg/dL — ABNORMAL HIGH (ref 1.7–2.4)

## 2023-06-17 LAB — MRSA NEXT GEN BY PCR, NASAL: MRSA by PCR Next Gen: NOT DETECTED

## 2023-06-17 LAB — AMMONIA: Ammonia: 57 umol/L — ABNORMAL HIGH (ref 9–35)

## 2023-06-17 LAB — HIV ANTIBODY (ROUTINE TESTING W REFLEX): HIV Screen 4th Generation wRfx: NONREACTIVE

## 2023-06-17 MED ORDER — ACETAMINOPHEN 325 MG PO TABS
650.0000 mg | ORAL_TABLET | Freq: Four times a day (QID) | ORAL | Status: DC | PRN
  Administered 2023-06-21 – 2023-06-26 (×6): 650 mg via ORAL
  Filled 2023-06-17 (×6): qty 2

## 2023-06-17 MED ORDER — POTASSIUM CHLORIDE 10 MEQ/100ML IV SOLN
10.0000 meq | INTRAVENOUS | Status: DC
  Administered 2023-06-17: 10 meq via INTRAVENOUS
  Filled 2023-06-17: qty 100

## 2023-06-17 MED ORDER — ACETAMINOPHEN 650 MG RE SUPP
650.0000 mg | Freq: Four times a day (QID) | RECTAL | Status: DC | PRN
  Administered 2023-06-23: 650 mg via RECTAL
  Filled 2023-06-17: qty 1

## 2023-06-17 MED ORDER — LORAZEPAM 2 MG/ML IJ SOLN
2.0000 mg | Freq: Once | INTRAMUSCULAR | Status: AC
  Administered 2023-06-17: 2 mg via INTRAVENOUS

## 2023-06-17 MED ORDER — DEXMEDETOMIDINE HCL IN NACL 400 MCG/100ML IV SOLN
0.0000 ug/kg/h | INTRAVENOUS | Status: DC
  Administered 2023-06-18: 1 ug/kg/h via INTRAVENOUS
  Administered 2023-06-18: 1.2 ug/kg/h via INTRAVENOUS
  Administered 2023-06-18 (×3): 0.8 ug/kg/h via INTRAVENOUS
  Administered 2023-06-19 – 2023-06-20 (×13): 1.2 ug/kg/h via INTRAVENOUS
  Administered 2023-06-21: 0.9 ug/kg/h via INTRAVENOUS
  Administered 2023-06-21 (×2): 1.2 ug/kg/h via INTRAVENOUS
  Administered 2023-06-21: 0.4 ug/kg/h via INTRAVENOUS
  Administered 2023-06-22 (×2): 0.5 ug/kg/h via INTRAVENOUS
  Administered 2023-06-22: 0.6 ug/kg/h via INTRAVENOUS
  Administered 2023-06-23: 1.2 ug/kg/h via INTRAVENOUS
  Administered 2023-06-23: 0.8 ug/kg/h via INTRAVENOUS
  Administered 2023-06-23: 0.6 ug/kg/h via INTRAVENOUS
  Administered 2023-06-23: 0.8 ug/kg/h via INTRAVENOUS
  Administered 2023-06-23: 1.2 ug/kg/h via INTRAVENOUS
  Administered 2023-06-24 – 2023-06-25 (×3): 0.6 ug/kg/h via INTRAVENOUS
  Administered 2023-06-25: 1.1 ug/kg/h via INTRAVENOUS
  Administered 2023-06-25: 1.2 ug/kg/h via INTRAVENOUS
  Administered 2023-06-25: 0.6 ug/kg/h via INTRAVENOUS
  Filled 2023-06-17 (×40): qty 100

## 2023-06-17 MED ORDER — SERTRALINE HCL 50 MG PO TABS
50.0000 mg | ORAL_TABLET | Freq: Every day | ORAL | Status: DC
  Administered 2023-06-17 – 2023-06-24 (×3): 50 mg via ORAL
  Filled 2023-06-17 (×3): qty 1

## 2023-06-17 MED ORDER — POTASSIUM CHLORIDE CRYS ER 20 MEQ PO TBCR
40.0000 meq | EXTENDED_RELEASE_TABLET | Freq: Once | ORAL | Status: AC
  Administered 2023-06-17: 40 meq via ORAL
  Filled 2023-06-17: qty 2

## 2023-06-17 MED ORDER — DIAZEPAM 5 MG/ML IJ SOLN
10.0000 mg | Freq: Once | INTRAMUSCULAR | Status: AC
  Administered 2023-06-17: 10 mg via INTRAVENOUS
  Filled 2023-06-17: qty 2

## 2023-06-17 MED ORDER — POTASSIUM CHLORIDE CRYS ER 20 MEQ PO TBCR
40.0000 meq | EXTENDED_RELEASE_TABLET | ORAL | Status: AC
  Administered 2023-06-17 (×2): 40 meq via ORAL
  Filled 2023-06-17 (×2): qty 2

## 2023-06-17 MED ORDER — SODIUM CHLORIDE 0.9 % IV BOLUS
1000.0000 mL | Freq: Once | INTRAVENOUS | Status: AC
Start: 2023-06-17 — End: 2023-06-17
  Administered 2023-06-17: 1000 mL via INTRAVENOUS

## 2023-06-17 MED ORDER — ONDANSETRON HCL 4 MG PO TABS: 4.0000 mg | ORAL_TABLET | Freq: Four times a day (QID) | ORAL | Status: DC | PRN

## 2023-06-17 MED ORDER — LACTULOSE 10 GM/15ML PO SOLN
30.0000 g | Freq: Once | ORAL | Status: AC
  Administered 2023-06-17: 30 g via ORAL
  Filled 2023-06-17: qty 60

## 2023-06-17 MED ORDER — LORAZEPAM 2 MG/ML IJ SOLN
2.0000 mg | Freq: Once | INTRAMUSCULAR | Status: AC
  Administered 2023-06-17: 2 mg via INTRAVENOUS
  Filled 2023-06-17: qty 1

## 2023-06-17 MED ORDER — HALOPERIDOL LACTATE 5 MG/ML IJ SOLN
5.0000 mg | Freq: Once | INTRAMUSCULAR | Status: AC
  Administered 2023-06-17: 5 mg via INTRAVENOUS
  Filled 2023-06-17: qty 1

## 2023-06-17 MED ORDER — ONDANSETRON HCL 4 MG/2ML IJ SOLN
4.0000 mg | Freq: Four times a day (QID) | INTRAMUSCULAR | Status: DC | PRN
  Administered 2023-06-22 – 2023-06-25 (×4): 4 mg via INTRAVENOUS
  Filled 2023-06-17 (×4): qty 2

## 2023-06-17 MED ORDER — HALOPERIDOL LACTATE 5 MG/ML IJ SOLN
5.0000 mg | Freq: Once | INTRAMUSCULAR | Status: AC
Start: 2023-06-17 — End: 2023-06-17
  Administered 2023-06-17: 5 mg via INTRAVENOUS
  Filled 2023-06-17: qty 1

## 2023-06-17 MED ORDER — PANTOPRAZOLE SODIUM 40 MG PO TBEC
40.0000 mg | DELAYED_RELEASE_TABLET | Freq: Every day | ORAL | Status: DC
  Administered 2023-06-17: 40 mg via ORAL
  Filled 2023-06-17: qty 1

## 2023-06-17 MED ORDER — IBUPROFEN 400 MG PO TABS
200.0000 mg | ORAL_TABLET | Freq: Four times a day (QID) | ORAL | Status: DC | PRN
  Administered 2023-06-22: 200 mg via ORAL
  Filled 2023-06-17: qty 1

## 2023-06-17 MED ORDER — POTASSIUM CHLORIDE IN NACL 20-0.9 MEQ/L-% IV SOLN
INTRAVENOUS | Status: AC
Start: 2023-06-17 — End: 2023-06-18
  Filled 2023-06-17 (×3): qty 1000

## 2023-06-17 MED ORDER — ENOXAPARIN SODIUM 40 MG/0.4ML IJ SOSY
40.0000 mg | PREFILLED_SYRINGE | INTRAMUSCULAR | Status: DC
  Administered 2023-06-17 – 2023-06-30 (×14): 40 mg via SUBCUTANEOUS
  Filled 2023-06-17 (×14): qty 0.4

## 2023-06-17 MED ORDER — TRAZODONE HCL 50 MG PO TABS
25.0000 mg | ORAL_TABLET | Freq: Every evening | ORAL | Status: DC | PRN
  Administered 2023-06-23 – 2023-06-24 (×2): 25 mg via ORAL
  Filled 2023-06-17 (×2): qty 1

## 2023-06-17 MED ORDER — MAGNESIUM HYDROXIDE 400 MG/5ML PO SUSP: 30.0000 mL | Freq: Every day | ORAL | Status: DC | PRN

## 2023-06-17 MED ORDER — CHLORHEXIDINE GLUCONATE CLOTH 2 % EX PADS
6.0000 | MEDICATED_PAD | Freq: Every day | CUTANEOUS | Status: DC
  Administered 2023-06-17: 6 via TOPICAL

## 2023-06-17 MED ORDER — DEXMEDETOMIDINE HCL IN NACL 400 MCG/100ML IV SOLN
INTRAVENOUS | Status: AC
  Administered 2023-06-17: 1.2 ug/kg/h via INTRAVENOUS
  Filled 2023-06-17: qty 100

## 2023-06-17 NOTE — Assessment & Plan Note (Addendum)
-   Psychiatry is on board,patient will go to [facility] once improved from alcohol withdrawal. - We will continue Zoloft and Elavil.
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
## 2023-06-17 NOTE — ED Notes (Signed)
Patient placed on 2L nasal cannula due to oxygen saturation dropping to 80% on room air while asleep. On 2L patient 97% at this time.

## 2023-06-17 NOTE — Plan of Care (Signed)

## 2023-06-17 NOTE — ED Notes (Signed)
Pt had episode of diarrhea. Changed by staff at this time. Pt able to lift hips and turn from side to side to assist with cleaning.
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
## 2023-06-17 NOTE — Assessment & Plan Note (Addendum)
-   This could be related to stress demargination, improving - Pending UA - Will follow CBC.

## 2023-06-17 NOTE — Assessment & Plan Note (Deleted)
-   This could be related to beer potomania. - We will continue hydration with IV normal saline and limit p.o. fluid intake.

## 2023-06-17 NOTE — Assessment & Plan Note (Signed)
-   We will continue PPI therapy 
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
## 2023-06-17 NOTE — Assessment & Plan Note (Addendum)
Still having some hallucinations but much calmer when seen today. -Continue with CIWA protocol -Continue to monitor
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
## 2023-06-17 NOTE — ED Provider Notes (Signed)
-----------------------------------------   1:18 AM on 06/17/2023 -----------------------------------------   Notified of patient now with altered mental status.  Repeated lab work; he is now cleared his anion gap, is mildly hypokalemic which we will replete orally and ammonia is mildly elevated which we will give lactulose.  Requires IV calming agent to hold still for CT head.  ED ECG REPORT I, Cameran Ahmed J, the attending physician, personally viewed and interpreted this ECG.   Date: 06/17/2023  EKG Time: 0117  Rate: 86  Rhythm: normal sinus rhythm  Axis: Normal  Intervals:none  ST&T Change: Nonspecific  ----------------------------------------- 2:33 AM on 06/17/2023 -----------------------------------------   IV Valium effective for calming.  CT head negative for ICH.  Will consult hospitalist services for evaluation and admission.   Irean Hong, MD 06/17/23 [phone]

## 2023-06-17 NOTE — Assessment & Plan Note (Addendum)
-   This could be related to beer potomania. - Seems stable, did receive some normal saline -Continue to monitor
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
## 2023-06-17 NOTE — Assessment & Plan Note (Deleted)
-   The patient will be admitted to a stepdown unit bed. - We will continue him on CIWA protocol. - We will utilize as needed IV Valium for persistent agitation. - Banana bag will be provided daily.

## 2023-06-17 NOTE — Progress Notes (Signed)
Pt now sleeping and calm. Sitter at bedside. Orders for violent restraints discontinued and order for non violent wrist restraints given.

## 2023-06-17 NOTE — ED Notes (Signed)
PT IVC/ HAS BEEN ACCEPTED TO [facility] WAITING TO BE MEDICALLY CLEARED.

## 2023-06-17 NOTE — Assessment & Plan Note (Addendum)
Potassium at 2.7 with magnesium of 2.5. -Replace potassium and monitor

## 2023-06-17 NOTE — Significant Event (Addendum)
Pt becoming more and more agitated and is hallucinating. Security called for pt becoming combative.  NP Mo called and came to bedside. Geodon given IM. New ciwa score called for a total of 4 mg ativan IV. Additional 2 mg given

## 2023-06-18 DIAGNOSIS — E871 Hypo-osmolality and hyponatremia: Secondary | ICD-10-CM | POA: Diagnosis not present

## 2023-06-18 DIAGNOSIS — D72829 Elevated white blood cell count, unspecified: Secondary | ICD-10-CM

## 2023-06-18 DIAGNOSIS — F10931 Alcohol use, unspecified with withdrawal delirium: Secondary | ICD-10-CM | POA: Diagnosis not present

## 2023-06-18 DIAGNOSIS — R45851 Suicidal ideations: Secondary | ICD-10-CM | POA: Diagnosis not present

## 2023-06-18 DIAGNOSIS — E876 Hypokalemia: Secondary | ICD-10-CM | POA: Diagnosis not present

## 2023-06-18 LAB — CBC
HCT: 38.8 % — ABNORMAL LOW (ref 39.0–52.0)
Hemoglobin: 13.3 g/dL (ref 13.0–17.0)
MCH: 29.9 pg (ref 26.0–34.0)
MCHC: 34.3 g/dL (ref 30.0–36.0)
MCV: 87.2 fL (ref 80.0–100.0)
Platelets: 204 10*3/uL (ref 150–400)
RBC: 4.45 MIL/uL (ref 4.22–5.81)
RDW: 13.3 % (ref 11.5–15.5)
WBC: 14.8 10*3/uL — ABNORMAL HIGH (ref 4.0–10.5)
nRBC: 0 % (ref 0.0–0.2)

## 2023-06-18 LAB — BASIC METABOLIC PANEL
Anion gap: 10 (ref 5–15)
BUN: 13 mg/dL (ref 6–20)
CO2: 24 mmol/L (ref 22–32)
Calcium: 8.8 mg/dL — ABNORMAL LOW (ref 8.9–10.3)
Chloride: 100 mmol/L (ref 98–111)
Creatinine, Ser: 0.71 mg/dL (ref 0.61–1.24)
GFR, Estimated: >60 mL/min (ref >60)
Glucose, Bld: 119 mg/dL — ABNORMAL HIGH (ref 70–99)
Potassium: 3.5 mmol/L (ref 3.5–5.1)
Sodium: 134 mmol/L — ABNORMAL LOW (ref 135–145)

## 2023-06-18 MED ORDER — LACTATED RINGERS IV SOLN: INTRAVENOUS | Status: DC

## 2023-06-18 MED ORDER — PHENOBARBITAL 32.4 MG PO TABS: 64.8000 mg | ORAL_TABLET | Freq: Three times a day (TID) | ORAL | Status: DC

## 2023-06-18 MED ORDER — PHENOBARBITAL 32.4 MG PO TABS: 97.2000 mg | ORAL_TABLET | Freq: Three times a day (TID) | ORAL | Status: DC

## 2023-06-18 MED ORDER — CHLORHEXIDINE GLUCONATE CLOTH 2 % EX PADS
6.0000 | MEDICATED_PAD | Freq: Every day | CUTANEOUS | Status: AC
  Administered 2023-06-19: 6 via TOPICAL

## 2023-06-18 MED ORDER — PHENOBARBITAL SODIUM 130 MG/ML IJ SOLN
130.0000 mg | INTRAMUSCULAR | Status: AC | PRN
  Administered 2023-06-18: 130 mg via INTRAVENOUS
  Filled 2023-06-18: qty 1

## 2023-06-18 MED ORDER — LACTATED RINGERS IV SOLN: INTRAVENOUS | Status: AC

## 2023-06-18 MED ORDER — PHENOBARBITAL 32.4 MG PO TABS: 32.4000 mg | ORAL_TABLET | Freq: Three times a day (TID) | ORAL | Status: DC

## 2023-06-18 MED ORDER — PHENOBARBITAL SODIUM 130 MG/ML IJ SOLN
130.0000 mg | Freq: Once | INTRAMUSCULAR | Status: AC
  Administered 2023-06-18: 130 mg via INTRAVENOUS
  Filled 2023-06-18: qty 1

## 2023-06-18 MED ORDER — LORAZEPAM 2 MG/ML IJ SOLN
1.0000 mg | INTRAMUSCULAR | Status: DC | PRN
  Administered 2023-06-18 – 2023-06-19 (×3): 1 mg via INTRAVENOUS
  Filled 2023-06-18 (×3): qty 1

## 2023-06-18 NOTE — Assessment & Plan Note (Signed)
Could be reactive

## 2023-06-18 NOTE — Plan of Care (Signed)
  Problem: Clinical Measurements: Goal: Ability to maintain clinical measurements within normal limits will improve Outcome: Progressing Goal: Will remain free from infection Outcome: Progressing Goal: Diagnostic test results will improve Outcome: Progressing Goal: Respiratory complications will improve Outcome: Progressing Goal: Cardiovascular complication will be avoided Outcome: Progressing   Problem: Safety: Goal: Violent Restraint(s) Outcome: Completed/Met   Problem: Safety: Goal: Non-violent Restraint(s) Outcome: Not Progressing

## 2023-06-18 NOTE — Assessment & Plan Note (Signed)
Still having some hallucinations and overnight became very agitated and combative, started on Precedex infusion, and has an episode of becoming very combative again last night and started on phenobarbital. -Continue with CIWA protocol -Continue to monitor

## 2023-06-18 NOTE — Progress Notes (Addendum)
Pt. Has increased agitation. Pt kicking and cussing at staff. MD notified. Orders received. Pt was upgraded to 4pt non violent restraints. MD was made aware.

## 2023-06-18 NOTE — Assessment & Plan Note (Signed)
Replace IV potassium

## 2023-06-18 NOTE — Consult Note (Signed)
The client was sedated on Precedex when the psych team attempted to assess him.  He was combative and agitated last night with restraints used.  Psych will continue to follow.  Nanine Means, PMHNP

## 2023-06-18 NOTE — Assessment & Plan Note (Signed)
 Likely from alcohol abuse

## 2023-06-18 NOTE — Plan of Care (Signed)
  Problem: Clinical Measurements: Goal: Ability to maintain clinical measurements within normal limits will improve Outcome: Progressing Goal: Will remain free from infection Outcome: Progressing Goal: Diagnostic test results will improve Outcome: Progressing Goal: Respiratory complications will improve Outcome: Progressing Goal: Cardiovascular complication will be avoided Outcome: Progressing   Problem: Coping: Goal: Level of anxiety will decrease Outcome: Progressing   Problem: Elimination: Goal: Will not experience complications related to bowel motility Outcome: Progressing Goal: Will not experience complications related to urinary retention Outcome: Progressing   Problem: Pain Management: Goal: General experience of comfort will improve Outcome: Progressing   Problem: Safety: Goal: Ability to remain free from injury will improve Outcome: Progressing   Problem: Skin Integrity: Goal: Risk for impaired skin integrity will decrease Outcome: Progressing

## 2023-06-18 NOTE — Progress Notes (Signed)
Progress Note   Patient: Ryan Miranda DGU:[id number] DOB: Mar 09, 1976 DOA: 06/15/2023     1 DOS: the patient was seen and examined on 06/18/2023   Brief hospital course: Taken from H&P.  Ryan Miranda is a 47 y.o. male with medical history significant for essential hypertension, depression, OSA on CPAP and alcohol abuse, presented to the emergency room with acute onset of suicidal ideation and on 11/29 which have been recurrent lately and got worse over the last 3 days.  He reported that he loaded his shotgun earlier during the day with a plan to kill himself but was stopped by son.   He was waiting for behavioral health admission in ER and developed alcohol withdrawal with hallucination and delirium resulted in admission under [facility] service.  On presenting to ED blood pressure was elevated at 171/131, tachycardia at 140, labs revealed hyponatremia 129 and potassium of 3.1 and chloride 92 calcium 8.7 with total bili of 2.9.  CBC showed leukocytosis of 13.2 compared to 15.6 at couple days ago.  Tylenol level was less than 10 a couple days ago and salicylate level less than 7.  Alcohol level then was 365.   EKG with NSR, HR of 86 and poor R wave progression, T wave inversion inferiorly. Noncontrast CT head was without any acute abnormality.  Patient was placed on CIWA protocol and potassium was repleted.  12/1: Vital stable, potassium with further decline to 2.7 which is being repleted, magnesium 2.5, improving leukocytosis at 11.3.  CIWA score of 4.  12/2: Vital stable, overnight became very agitated and combative and started on Precedex infusion.  Currently sedated   Assessment and Plan: * Alcohol withdrawal delirium, acute, hyperactive (HCC) Still having some hallucinations and overnight became very agitated and combative, started on Precedex infusion -Continue with CIWA protocol -Continue to monitor  Hypokalemia Improved. -Continue to monitor and replace as  needed  Hyponatremia - This could be related to beer potomania.  Improving - Seems stable, did receive some normal saline -Continue to monitor  Leukocytosis - This could be related to stress demargination, slight worsening today - UA is not consistent with UTI - Will follow CBC.  Depression with suicidal ideation - Psychiatry is on board,patient will go to [facility] once improved from alcohol withdrawal. - We will continue Zoloft and Elavil.  GERD without esophagitis - We will continue PPI therapy.   Subjective: Patient was sedated with Precedex infusion when seen today.  Overnight significant agitation and combativeness requiring security.  Physical Exam: Vitals:   06/18/23 1100 06/18/23 1200 06/18/23 1300 06/18/23 1400  BP: 95/73 101/75 96/78 92/66   Pulse: 68 68 72 73  Resp: (!) 26 15 15  (!) 22  Temp:  (!) 97.3 F (36.3 C)    TempSrc:  Axillary    SpO2: 100% 100% 99% 100%  Weight:      Height:    5\' 10"  (1.778 m)   General.  Obese gentleman, in no acute distress. Pulmonary.  Lungs clear bilaterally, normal respiratory effort. CV.  Regular rate and rhythm, no JVD, rub or murmur. Abdomen.  Soft, nontender, nondistended, BS positive. CNS.  Sedated, no apparent deficit. Extremities.  No edema, no cyanosis, pulses intact and symmetrical.  Data Reviewed: Prior data reviewed.  Family Communication:   Disposition: Status is: Inpatient Remains inpatient appropriate because: Severity of illness  Planned Discharge Destination:  Behavioral health  DVT prophylaxis.  Lovenox Time spent: 50 minutes  This record has been created using Conservation officer, historic buildings.  Errors have been sought and corrected,but may not always be located. Such creation errors do not reflect on the standard of care.   Author: Arnetha Courser, MD 06/18/2023 3:44 PM  For on call review [email or web address].

## 2023-06-19 DIAGNOSIS — E871 Hypo-osmolality and hyponatremia: Secondary | ICD-10-CM | POA: Diagnosis not present

## 2023-06-19 DIAGNOSIS — E876 Hypokalemia: Secondary | ICD-10-CM | POA: Diagnosis not present

## 2023-06-19 DIAGNOSIS — R45851 Suicidal ideations: Secondary | ICD-10-CM | POA: Diagnosis not present

## 2023-06-19 DIAGNOSIS — F10931 Alcohol use, unspecified with withdrawal delirium: Secondary | ICD-10-CM | POA: Diagnosis not present

## 2023-06-19 MED ORDER — LORAZEPAM 2 MG/ML IJ SOLN
1.0000 mg | INTRAMUSCULAR | Status: DC | PRN
Start: 2023-06-19 — End: 2023-06-25
  Administered 2023-06-19 – 2023-06-25 (×17): 1 mg via INTRAVENOUS
  Filled 2023-06-19 (×21): qty 1

## 2023-06-19 MED ORDER — LORAZEPAM 2 MG/ML IJ SOLN: 1.0000 mg | INTRAMUSCULAR | Status: DC | PRN

## 2023-06-19 MED ORDER — PHENOBARBITAL SODIUM 65 MG/ML IJ SOLN
32.5000 mg | Freq: Three times a day (TID) | INTRAMUSCULAR | Status: AC
  Administered 2023-06-23 – 2023-06-25 (×6): 32.5 mg via INTRAVENOUS
  Filled 2023-06-19 (×6): qty 1

## 2023-06-19 MED ORDER — PHENOBARBITAL SODIUM 130 MG/ML IJ SOLN
65.0000 mg | Freq: Three times a day (TID) | INTRAMUSCULAR | Status: AC
  Administered 2023-06-21 – 2023-06-23 (×6): 65 mg via INTRAVENOUS
  Filled 2023-06-19 (×6): qty 1

## 2023-06-19 MED ORDER — PHENOBARBITAL SODIUM 130 MG/ML IJ SOLN
130.0000 mg | Freq: Once | INTRAMUSCULAR | Status: AC | PRN
  Administered 2023-06-19: 130 mg via INTRAVENOUS
  Filled 2023-06-19: qty 1

## 2023-06-19 MED ORDER — LACTATED RINGERS IV SOLN: INTRAVENOUS | Status: DC

## 2023-06-19 MED ORDER — PHENOBARBITAL SODIUM 130 MG/ML IJ SOLN
97.5000 mg | Freq: Three times a day (TID) | INTRAMUSCULAR | Status: AC
  Administered 2023-06-19 – 2023-06-21 (×6): 97.5 mg via INTRAVENOUS
  Filled 2023-06-19 (×6): qty 1

## 2023-06-19 MED ORDER — PHENOBARBITAL SODIUM 130 MG/ML IJ SOLN
130.0000 mg | INTRAMUSCULAR | Status: AC | PRN
  Administered 2023-06-19: 130 mg via INTRAVENOUS
  Filled 2023-06-19: qty 1

## 2023-06-19 NOTE — Consult Note (Signed)
Psychiatry attempted assessment. Pt sedated and unable to participate in assessment. Per chart review, pt was started on precedex infusion. Did get agitated last night.

## 2023-06-19 NOTE — Discharge Instructions (Signed)
 Agency Name: [facility] Address: [address], [place], [place] [address] Phone: [phone] Website: [email or web address] Service(s) Offered: Housing services, self-sufficiency, congregate meal program, and individual development account program.  Agency Name: [employer] [facility] [place] Address: [address], [place], [place] [address] Phone: [phone] Email: [email or web address] Website: [email or web address] Service(s) Offered: Housing the homeless, feeding the hungry, Company secretary, job and education related services.  Agency Name: [facility] Address: [address] [place], [place] [address] Phone: [phone] Email: [email or web address] Service(s) Offered: Counseling, problem pregnancy, advocacy for Hispanics, limited emergency financial assistance.  Agency Name: [employer] Address: [address] [place], [place] [address] Phone: [phone] Website: [email or web address] Service(s) Offered: Child support services; child welfare services; SNAP; Medicaid; work first family assistance; and aid with fuel,  rent, food and medicine.  Agency Name: [employer] Address: [address], [place], [place] [address] Phone: [phone] or [phone] Email: [email or web address] Service(s) Offered: Family services and transient assistance; emergency food, fuel, clothing, limited furniture, utilities; budget counseling, general counseling; give a kid a coat; thrift store; Christmas food and toys. Utility assistance, food pantry, rental  assistance, life sustaining medicine

## 2023-06-19 NOTE — TOC CM/SW Note (Signed)
Transition of Care [facility]) - Inpatient Brief Assessment   Patient Details  Name: Ryan Miranda MRN: [id number] Date of Birth: 02/14/1976  Transition of Care [facility]) CM/SW Contact:    Margarito Liner, LCSW Phone Number: 06/19/2023, 12:11 PM   Clinical Narrative: CSW reviewed chart. SDOH flag for utilities. CSW added resources to AVS. Patient has current recommendation for inpatient psych but he is not medically stable. Will continue to follow progress.  Transition of Care Asessment: Insurance and Status: Insurance coverage has been reviewed Patient has primary care physician: Yes Home environment has been reviewed: Apartment Prior level of function:: Not documented Prior/Current Home Services: No current home services Social Determinants of Health Reivew: SDOH reviewed interventions complete Readmission risk has been reviewed: Yes Transition of care needs: transition of care needs identified, TOC will continue to follow

## 2023-06-19 NOTE — Progress Notes (Signed)
Progress Note   Patient: Ryan Miranda RUE:[id number] DOB: February 14, 1976 DOA: 06/15/2023     2 DOS: the patient was seen and examined on 06/19/2023   Brief hospital course: Taken from H&P.  Ryan Miranda is a 47 y.o. male with medical history significant for essential hypertension, depression, OSA on CPAP and alcohol abuse, presented to the emergency room with acute onset of suicidal ideation and on 11/29 which have been recurrent lately and got worse over the last 3 days.  He reported that he loaded his shotgun earlier during the day with a plan to kill himself but was stopped by son.   He was waiting for behavioral health admission in ER and developed alcohol withdrawal with hallucination and delirium resulted in admission under [facility] service.  On presenting to ED blood pressure was elevated at 171/131, tachycardia at 140, labs revealed hyponatremia 129 and potassium of 3.1 and chloride 92 calcium 8.7 with total bili of 2.9.  CBC showed leukocytosis of 13.2 compared to 15.6 at couple days ago.  Tylenol level was less than 10 a couple days ago and salicylate level less than 7.  Alcohol level then was 365.   EKG with NSR, HR of 86 and poor R wave progression, T wave inversion inferiorly. Noncontrast CT head was without any acute abnormality.  Patient was placed on CIWA protocol and potassium was repleted.  12/1: Vital stable, potassium with further decline to 2.7 which is being repleted, magnesium 2.5, improving leukocytosis at 11.3.  CIWA score of 4.  12/2: Vital stable, overnight became very agitated and combative and started on Precedex infusion.  Currently sedated.  12/3: Patient again became very agitated and combative in the evening, started on phenobarbital.   Assessment and Plan: * Alcohol withdrawal delirium, acute, hyperactive (HCC) Still having some hallucinations and overnight became very agitated and combative, started on Precedex infusion, and has an episode  of becoming very combative again last night and started on phenobarbital. -Continue with CIWA protocol -Continue to monitor  Hypokalemia Improved. -Continue to monitor and replace as needed  Hyponatremia - This could be related to beer potomania.  Improving - Seems stable, did receive some normal saline -Continue to monitor  Leukocytosis - This could be related to stress demargination, slight worsening today - UA is not consistent with UTI - Will follow CBC.  Depression with suicidal ideation - Psychiatry is on board,patient will go to [facility] once improved from alcohol withdrawal. - We will continue Zoloft and Elavil.  GERD without esophagitis - We will continue PPI therapy.   Subjective: Patient was again sedated when seen during morning rounds.  Nursing concern of being very combative and agitated yesterday evening and received multiple doses of as needed on also started on phenobarbital  Physical Exam: Vitals:   06/19/23 1134 06/19/23 1200 06/19/23 1207 06/19/23 1300  BP: 104/77 (!) 122/92  112/88  Pulse: 77 77 80 76  Resp:  16 19 (!) 25  Temp:   98.2 F (36.8 C)   TempSrc:   Axillary   SpO2:  98% 97% 98%  Weight:      Height:       General.  Overweight gentleman, in no acute distress. Pulmonary.  Lungs clear bilaterally, normal respiratory effort. CV.  Regular rate and rhythm, no JVD, rub or murmur. Abdomen.  Soft, nontender, nondistended, BS positive. CNS.  Sedated, no apparent deficit Extremities.  No edema, no cyanosis, pulses intact and symmetrical.  Data Reviewed: Prior data reviewed.  Family  Communication:   Disposition: Status is: Inpatient Remains inpatient appropriate because: Severity of illness  Planned Discharge Destination:  Behavioral health  DVT prophylaxis.  Lovenox Time spent: 50 minutes  This record has been created using Conservation officer, historic buildings. Errors have been sought and corrected,but may not always be located. Such creation  errors do not reflect on the standard of care.   Author: Arnetha Courser, MD 06/19/2023 1:47 PM  For on call review [email or web address].

## 2023-06-19 NOTE — Plan of Care (Signed)
  Problem: Activity: Goal: Risk for activity intolerance will decrease Outcome: Progressing   Problem: Elimination: Goal: Will not experience complications related to urinary retention Outcome: Progressing   

## 2023-06-19 NOTE — Plan of Care (Signed)
  Problem: Clinical Measurements: Goal: Ability to maintain clinical measurements within normal limits will improve Outcome: Progressing Goal: Will remain free from infection Outcome: Progressing Goal: Diagnostic test results will improve Outcome: Progressing Goal: Respiratory complications will improve Outcome: Progressing   Problem: Safety: Goal: Non-violent Restraint(s) Outcome: Not Progressing

## 2023-06-20 ENCOUNTER — Inpatient Hospital Stay: Payer: Managed Care, Other (non HMO)

## 2023-06-20 DIAGNOSIS — E876 Hypokalemia: Secondary | ICD-10-CM | POA: Diagnosis not present

## 2023-06-20 DIAGNOSIS — E871 Hypo-osmolality and hyponatremia: Secondary | ICD-10-CM | POA: Diagnosis not present

## 2023-06-20 DIAGNOSIS — K56609 Unspecified intestinal obstruction, unspecified as to partial versus complete obstruction: Secondary | ICD-10-CM | POA: Insufficient documentation

## 2023-06-20 DIAGNOSIS — R45851 Suicidal ideations: Secondary | ICD-10-CM | POA: Diagnosis not present

## 2023-06-20 DIAGNOSIS — F10931 Alcohol use, unspecified with withdrawal delirium: Secondary | ICD-10-CM | POA: Diagnosis not present

## 2023-06-20 LAB — BASIC METABOLIC PANEL
Anion gap: 10 (ref 5–15)
Anion gap: 14 (ref 5–15)
BUN: 10 mg/dL (ref 6–20)
BUN: 11 mg/dL (ref 6–20)
CO2: 26 mmol/L (ref 22–32)
CO2: 24 mmol/L (ref 22–32)
Calcium: 8.6 mg/dL — ABNORMAL LOW (ref 8.9–10.3)
Calcium: 8.5 mg/dL — ABNORMAL LOW (ref 8.9–10.3)
Chloride: 96 mmol/L — ABNORMAL LOW (ref 98–111)
Chloride: 93 mmol/L — ABNORMAL LOW (ref 98–111)
Creatinine, Ser: 0.7 mg/dL (ref 0.61–1.24)
Creatinine, Ser: 0.7 mg/dL (ref 0.61–1.24)
GFR, Estimated: >60 mL/min (ref >60)
GFR, Estimated: >60 mL/min (ref >60)
Glucose, Bld: 105 mg/dL — ABNORMAL HIGH (ref 70–99)
Glucose, Bld: 113 mg/dL — ABNORMAL HIGH (ref 70–99)
Potassium: 3 mmol/L — ABNORMAL LOW (ref 3.5–5.1)
Potassium: 3.3 mmol/L — ABNORMAL LOW (ref 3.5–5.1)
Sodium: 132 mmol/L — ABNORMAL LOW (ref 135–145)
Sodium: 131 mmol/L — ABNORMAL LOW (ref 135–145)

## 2023-06-20 LAB — HEPATIC FUNCTION PANEL
ALT: 31 U/L (ref 0–44)
AST: 46 U/L — ABNORMAL HIGH (ref 15–41)
Albumin: 3.2 g/dL — ABNORMAL LOW (ref 3.5–5.0)
Alkaline Phosphatase: 81 U/L (ref 38–126)
Bilirubin, Direct: 0.3 mg/dL — ABNORMAL HIGH (ref 0.0–0.2)
Indirect Bilirubin: 0.9 mg/dL (ref 0.3–0.9)
Total Bilirubin: 1.2 mg/dL — ABNORMAL HIGH (ref <1.2)
Total Protein: 6.3 g/dL — ABNORMAL LOW (ref 6.5–8.1)

## 2023-06-20 LAB — GLUCOSE, CAPILLARY
Glucose-Capillary: 140 mg/dL — ABNORMAL HIGH (ref 70–99)
Glucose-Capillary: 116 mg/dL — ABNORMAL HIGH (ref 70–99)
Glucose-Capillary: 126 mg/dL — ABNORMAL HIGH (ref 70–99)

## 2023-06-20 LAB — PROCALCITONIN: Procalcitonin: 0.18 ng/mL

## 2023-06-20 LAB — CBC
HCT: 35.7 % — ABNORMAL LOW (ref 39.0–52.0)
Hemoglobin: 12.5 g/dL — ABNORMAL LOW (ref 13.0–17.0)
MCH: 30 pg (ref 26.0–34.0)
MCHC: 35 g/dL (ref 30.0–36.0)
MCV: 85.6 fL (ref 80.0–100.0)
Platelets: 193 10*3/uL (ref 150–400)
RBC: 4.17 MIL/uL — ABNORMAL LOW (ref 4.22–5.81)
RDW: 13.2 % (ref 11.5–15.5)
WBC: 14.8 10*3/uL — ABNORMAL HIGH (ref 4.0–10.5)
nRBC: 0 % (ref 0.0–0.2)

## 2023-06-20 LAB — PHOSPHORUS
Phosphorus: 1.1 mg/dL — ABNORMAL LOW (ref 2.5–4.6)
Phosphorus: 4 mg/dL (ref 2.5–4.6)

## 2023-06-20 LAB — MAGNESIUM
Magnesium: 1.6 mg/dL — ABNORMAL LOW (ref 1.7–2.4)
Magnesium: 1.9 mg/dL (ref 1.7–2.4)

## 2023-06-20 MED ORDER — DIAZEPAM 5 MG/ML IJ SOLN
2.5000 mg | Freq: Four times a day (QID) | INTRAMUSCULAR | Status: DC
  Administered 2023-06-20 – 2023-06-25 (×18): 2.5 mg via INTRAVENOUS
  Filled 2023-06-20 (×18): qty 2

## 2023-06-20 MED ORDER — POTASSIUM CHLORIDE CRYS ER 20 MEQ PO TBCR
20.0000 meq | EXTENDED_RELEASE_TABLET | ORAL | Status: DC
  Filled 2023-06-20: qty 1

## 2023-06-20 MED ORDER — MAGNESIUM SULFATE 2 GM/50ML IV SOLN
2.0000 g | Freq: Once | INTRAVENOUS | Status: AC
  Administered 2023-06-20: 2 g via INTRAVENOUS
  Filled 2023-06-20: qty 50

## 2023-06-20 MED ORDER — POTASSIUM PHOSPHATES 15 MMOLE/5ML IV SOLN
30.0000 mmol | Freq: Once | INTRAVENOUS | Status: AC
  Administered 2023-06-20: 30 mmol via INTRAVENOUS
  Filled 2023-06-20: qty 10

## 2023-06-20 MED ORDER — THIAMINE HCL 100 MG/ML IJ SOLN
100.0000 mg | Freq: Every day | INTRAMUSCULAR | Status: DC
  Administered 2023-06-23 – 2023-06-26 (×4): 100 mg via INTRAVENOUS
  Filled 2023-06-20 (×4): qty 2

## 2023-06-20 MED ORDER — ORAL CARE MOUTH RINSE: 15.0000 mL | OROMUCOSAL | Status: DC | PRN

## 2023-06-20 MED ORDER — POTASSIUM CHLORIDE 10 MEQ/100ML IV SOLN
10.0000 meq | INTRAVENOUS | Status: AC
  Administered 2023-06-20 – 2023-06-21 (×6): 10 meq via INTRAVENOUS
  Filled 2023-06-20 (×12): qty 100

## 2023-06-20 MED ORDER — THIAMINE HCL 100 MG/ML IJ SOLN
500.0000 mg | INTRAVENOUS | Status: AC
  Administered 2023-06-20 – 2023-06-22 (×3): 500 mg via INTRAVENOUS
  Filled 2023-06-20 (×3): qty 5

## 2023-06-20 MED ORDER — CHLORHEXIDINE GLUCONATE CLOTH 2 % EX PADS: 6.0000 | MEDICATED_PAD | Freq: Every day | CUTANEOUS | Status: DC

## 2023-06-20 MED ORDER — PANTOPRAZOLE SODIUM 40 MG IV SOLR
40.0000 mg | INTRAVENOUS | Status: DC
  Administered 2023-06-20 – 2023-06-26 (×7): 40 mg via INTRAVENOUS
  Filled 2023-06-20 (×7): qty 10

## 2023-06-20 MED ORDER — CHLORHEXIDINE GLUCONATE CLOTH 2 % EX PADS
6.0000 | MEDICATED_PAD | Freq: Every day | CUTANEOUS | Status: DC
  Administered 2023-06-20 – 2023-06-24 (×5): 6 via TOPICAL

## 2023-06-20 MED ORDER — POTASSIUM CHLORIDE 10 MEQ/100ML IV SOLN
10.0000 meq | INTRAVENOUS | Status: DC
Start: 2023-06-20 — End: 2023-06-20
  Filled 2023-06-20 (×4): qty 100

## 2023-06-20 NOTE — Progress Notes (Signed)
Progress Note   Patient: Ryan Miranda DGU:[id number] DOB: 1976-01-19 DOA: 06/15/2023     3 DOS: the patient was seen and examined on 06/20/2023   Brief hospital course: 48 y.o. male with medical history significant for essential hypertension, depression, OSA on CPAP and alcohol abuse, presented to the emergency room with acute onset of suicidal ideation and on 11/29 which have been recurrent lately and got worse over the last 3 days.  He reported that he loaded his shotgun earlier during the day with a plan to kill himself but was stopped by son.   He was waiting for behavioral health admission in ER and developed alcohol withdrawal with hallucination and delirium resulted in admission under [facility] service.  On presenting to ED blood pressure was elevated at 171/131, tachycardia at 140, labs revealed hyponatremia 129 and potassium of 3.1 and chloride 92 calcium 8.7 with total bili of 2.9.  CBC showed leukocytosis of 13.2 compared to 15.6 at couple days ago.  Tylenol level was less than 10 a couple days ago and salicylate level less than 7.  Alcohol level then was 365.   EKG with NSR, HR of 86 and poor R wave progression, T wave inversion inferiorly. Noncontrast CT head was without any acute abnormality.  Patient was placed on CIWA protocol and potassium was repleted.  12/1: Vital stable, potassium with further decline to 2.7 which is being repleted, magnesium 2.5, improving leukocytosis at 11.3.  CIWA score of 4.  12/2: Vital stable, overnight became very agitated and combative and started on Precedex infusion.  Currently sedated.  12/3: Patient again became very agitated and combative in the evening, started on phenobarbital.  12/4.  Consulted critical care team since the patient is in 4 point restraints and on still on Precedex drip.  Critical care team took over case.  Assessment and Plan: * Delirium tremens (HCC) Severe alcohol withdrawal with delirium tremens.  Patient on  continuous Precedex drip, phenobarbital, alcohol withdrawal protocol.  Critical care team will take over since patient has been on continuous Precedex drip for days.  High-dose IV thiamine.  Hypophosphatemia Replace IV K-Phos today  Hypomagnesemia Replace IV magnesium  Hypokalemia Replace IV potassium  Hyponatremia Likely from alcohol abuse  Leukocytosis Could be reactive  Depression with suicidal ideation Psychiatry reevaluation once able to talk better.  SBO (small bowel obstruction) (HCC) Serial abdominal x-ray  GERD without esophagitis Protonix        Subjective: Patient was asking me to on restrain him.  Patient was fidgeting in the bed.  On Precedex drip and phenobarbital.  Physical Exam: Vitals:   06/20/23 1330 06/20/23 1400 06/20/23 1420 06/20/23 1430  BP: 110/76  (!) 114/90 (!) 114/90  Pulse: 92 94 95 95  Resp: (!) 35 (!) 24  (!) 30  Temp:      TempSrc:      SpO2: 96% 93%  94%  Weight:      Height:       Physical Exam HENT:     Head: Normocephalic.     Mouth/Throat:     Pharynx: No oropharyngeal exudate.  Eyes:     General: Lids are normal.     Conjunctiva/sclera: Conjunctivae normal.  Cardiovascular:     Rate and Rhythm: Normal rate and regular rhythm.     Heart sounds: Normal heart sounds, S1 normal and S2 normal.  Pulmonary:     Breath sounds: No decreased breath sounds, wheezing, rhonchi or rales.  Abdominal:  Palpations: Abdomen is soft.     Tenderness: There is no abdominal tenderness.  Musculoskeletal:     Right lower leg: No swelling.     Left lower leg: No swelling.  Skin:    General: Skin is warm.     Findings: No rash.  Neurological:     Mental Status: He is disoriented.     Comments: Moves all extremities on his own.     Data Reviewed: Sodium 132, potassium 3.0, magnesium 1.6, phosphorus 1.1, creatinine 0.9, hemoglobin 12.5, procalcitonin 0.18  Disposition: Status is: Inpatient Remains inpatient appropriate because:  Critical care team will take over secondary to being on Precedex drip  Planned Discharge Destination: To be determined    Time spent: 28 minutes Case discussed with critical care team.  Medical team will sign off since critical care team taking over.  Author: Alford Highland, MD 06/20/2023 3:49 PM  For on call review [email or web address].

## 2023-06-20 NOTE — Plan of Care (Signed)
  Problem: Clinical Measurements: Goal: Will remain free from infection Outcome: Progressing Goal: Respiratory complications will improve Outcome: Progressing Goal: Cardiovascular complication will be avoided Outcome: Progressing   Problem: Activity: Goal: Risk for activity intolerance will decrease Outcome: Progressing   Problem: Elimination: Goal: Will not experience complications related to bowel motility Outcome: Progressing Goal: Will not experience complications related to urinary retention Outcome: Progressing   Problem: Pain Management: Goal: General experience of comfort will improve Outcome: Progressing   Problem: Safety: Goal: Ability to remain free from injury will improve Outcome: Progressing   Problem: Skin Integrity: Goal: Risk for impaired skin integrity will decrease Outcome: Progressing

## 2023-06-20 NOTE — Assessment & Plan Note (Signed)
Replace IV magnesium

## 2023-06-20 NOTE — Assessment & Plan Note (Signed)
Serial abdominal x-ray

## 2023-06-20 NOTE — Assessment & Plan Note (Signed)
Replaced. 

## 2023-06-20 NOTE — Consult Note (Signed)
NAME:  Ryan Miranda, MRN:  [id number], DOB:  May 12, 1976, LOS: 3 ADMISSION DATE:  06/15/2023, CONSULTATION DATE:  06/20/2023 REFERRING MD:  Dr. Renae Gloss, CHIEF COMPLAINT:  Severe Alcohol Withdrawal   Brief Pt Description / Synopsis:  47 y.o. male who was to be admitted to behavioral health for suicidal ideation.  While in ED boarding, course complicated by development of severe alcohol withdrawals and Delirium Tremens.  History of Present Illness:  Ryan Miranda is a 47 y.o. male with medical history significant for essential hypertension, depression, OSA on CPAP and alcohol abuse, presented to the emergency room with acute onset of suicidal ideation and on 11/29 which have been recurrent lately and got worse over the last 3 days.  He reported that he loaded his shotgun earlier during the day with a plan to kill himself but was stopped by son.    He was barding in the ED while waiting for behavioral health admission.  He was to be transferred to [facility], however he developed alcohol withdrawal with hallucination and delirium which required medical admission under [facility] service.  ED Course: Initial Vital Signs:  BP 171/131, tachycardia at 140,  Significant Labs: hyponatremia 129 and potassium of 3.1 and chloride 92 calcium 8.7 with total bili of 2.9. CBC showed leukocytosis of 13.2 compared to 15.6 at couple days ago. Tylenol level was less than 10 a couple days ago and salicylate level less than 7. Alcohol level then was 365  Imaging CT Head>>IMPRESSION: No acute intracranial abnormality. Medications Administered:  2L NS bolus, IM phenobarbital, Zyprexa, 2 mg IV Ativan x3 does, 5 mg Haldol, 10 mg Hydralazine, Valium 10 mg x2 doses, 5 mg Haldol   He was admitted by the Hospitalist service for further workup and treatment. Please see "Significant Hospital Events" for further workup and treatment.   Pertinent  Medical History   Past Medical  History:  Diagnosis Date   Depression    Elevated ferritin level    Elevated LFTs    Hypertension    Iron overload 02/06/2019   Laryngopharyngeal reflux (LPR)    OSA on CPAP    Tremors of nervous system    Unintentional weight loss     Micro Data:  12/1: HIV screen>>negative  Antimicrobials:   Anti-infectives (From admission, onward)    None       Significant Hospital Events: Including procedures, antibiotic start and stop dates in addition to other pertinent events   11/29: Presented wit suicidal ideation.  Placed under IVC, To be admitted to behavioral health. 11/30: Was to be transferred to [facility] behavioral health hospital, however CIWA score too high in setting of developing alcohol withdrawal.  Hospitilaist asked to admit for medical management of withdrawals. 12/1: Vital stable, potassium with further decline to 2.7 which is being repleted, magnesium 2.5, improving leukocytosis at 11.3.  CIWA score of 4. 12/2: Vital stable, overnight became very agitated and combative and started on Precedex infusion.  Currently sedated. 12/3: Patient again became very agitated and combative in the evening, started on phenobarbital. 12/4: Remains on Precedex. Mental status fluctuates b/w sedated and agitation. PCCM consulted.  Abdomen distended, KUB concerning for possible SBO.  Interim History / Subjective:  -No significant events noted overnight  -Afebrile, hemodynamically stable, on Nasal cannula -Remains on Precedex (has been on Precedex since 12/2), Phenobarbital started yesterday ~ PCCM consulted -Lightly sedated, arouses to voice, oriented only to person, does follow all commands -Abdomen noted to be distended ~  KUB obtained and concerning for possible SBO. -CBC pending this morning   Objective   Blood pressure 117/89, pulse 94, temperature 98 F (36.7 C), temperature source Axillary, resp. rate 18, height 5\' 10"  (1.778 m), weight 102.6 kg, SpO2 95%.        Intake/Output  Summary (Last 24 hours) at 06/20/2023 0817 Last data filed at 06/20/2023 1610 Gross per 24 hour  Intake 3282.94 ml  Output 1210 ml  Net 2072.94 ml   Filed Weights   06/17/23 0929  Weight: 102.6 kg    Examination: General: Acute ill appearing male, laying in bed, in 4 point restraints, sedated on precedex, on nasal cannula, currently protecting his airway, in NAD HENT: Atraumatic, normocephalic, neck supple, no JVD Lungs: Clear diminished breath sounds throughout, even, nonlabored, normal effort Cardiovascular:  RRR, s1s2, no M/R/G Abdomen: Distended, slightly taught, no tenderness to palpation, no guarding or rebound tenderness, BS + x4 Extremities: Normal bulk and tone, no deformities, no edema, no cyanosis Neuro: Lightly sedated, arouses to voice, oriented only to self, follows commands, no focal deficits noted, pupils PERRLA, mental status fluctuates between sedated and agitated GU: External male catheter in place  Resolved Hospital Problem list     Assessment & Plan:   #Severe Alcohol Withdrawal with Delirium Tremens #Suicidal Ideation PMHx: Depression -Treatment of DT's & metabolic derangements as outlined below -Provide supportive care -Promote normal sleep/wake cycle and family presence -Avoid sedating medications as able -Continue Phenobarbital taper -CIWA protocol -Will add scheduled Valium IV 2.5 mg q6h -Start high dose Thiamine x3 days, then resume 100 mg daily -Continue Folic acid, MVI -Continue IVC and suicide sitter/precautions -Psych consult for inpatient admission once through DT's  #Hypokalemia #Hyponatremia, likely secondary to beer potomania #Hypomagnesemia -Monitor I&O's / urinary output -Follow BMP -Ensure adequate renal perfusion -Avoid nephrotoxic agents as able -Replace electrolytes as indicated ~ Pharmacy following for assistance with electrolyte replacement -IV fluids  #Abdominal Distention with concern for Small Bowel Obstruction -With  DT's, will be difficult to place NGT to LIS -NPO and bowel rest -If develops N/V or worsening distention, then will have to sedate him further to place NGT -Follow serial abdominal exams and serial KUB's -Low threshold for General Surgery consult  #Leukocytosis UA negative for UTI -Monitor fever curve -Trend WBC's & Procalcitonin -Follow cultures as above -Depending on trend today, may need to obtain CXR to rule out aspiration     Pt is critically ill with severe DT's and SBO.  Currently protecting his airway, but high risk for aspiration,  decompensation necessitating intubation, cardiac arrest, and death.  Prognosis is guarded.   Best Practice (right click and "Reselect all SmartList Selections" daily)   Diet/type: NPO DVT prophylaxis: LMWH GI prophylaxis: PPI Lines: N/A Foley:  N/A Code Status:  full code Last date of multidisciplinary goals of care discussion [12/4]  12/4:  Will update family when they arrive at bedside.  Labs   CBC: Recent Labs  Lab 06/15/23 1657 06/17/23 0008 06/17/23 0659 06/18/23 1013  WBC 15.6* 13.2* 11.3* 14.8*  HGB 15.9 13.6 14.3 13.3  HCT 46.4 39.3 42.0 38.8*  MCV 87.9 86.4 87.3 87.2  PLT 366 256 245 204    Basic Metabolic Panel: Recent Labs  Lab 06/17/23 0007 06/17/23 0008 06/17/23 0659 06/17/23 1854 06/18/23 1013 06/20/23 0433  NA  --  129* 132* 130* 134* 132*  K  --  3.1* 2.7* 3.7 3.5 3.0*  CL  --  92* 95* 98 100 96*  CO2  --  24 26 25 24 26   GLUCOSE  --  108* 100* 142* 119* 105*  BUN  --  18 15 13 13 10   CREATININE  --  0.89 0.91 0.78 0.71 0.70  CALCIUM  --  8.7* 8.6* 8.6* 8.8* 8.6*  MG 2.5*  --   --   --   --  1.6*  PHOS  --   --   --   --   --  1.1*   GFR: Estimated Creatinine Clearance: 134 mL/min (by C-G formula based on SCr of 0.7 mg/dL). Recent Labs  Lab 06/15/23 1657 06/17/23 0008 06/17/23 0659 06/18/23 1013  WBC 15.6* 13.2* 11.3* 14.8*    Liver Function Tests: Recent Labs  Lab 06/15/23 1657  06/17/23 0008  AST 62* 63*  ALT 35 33  ALKPHOS 96 92  BILITOT 1.6* 2.9*  PROT 8.7* 7.2  ALBUMIN 5.1* 4.2   No results for input(s): "LIPASE", "AMYLASE" in the last 168 hours. Recent Labs  Lab 06/17/23 0008  AMMONIA 57*    ABG No results found for: "PHART", "PCO2ART", "PO2ART", "HCO3", "TCO2", "ACIDBASEDEF", "O2SAT"   Coagulation Profile: No results for input(s): "INR", "PROTIME" in the last 168 hours.  Cardiac Enzymes: No results for input(s): "CKTOTAL", "CKMB", "CKMBINDEX", "TROPONINI" in the last 168 hours.  HbA1C: No results found for: "HGBA1C"  CBG: Recent Labs  Lab 06/17/23 0932  GLUCAP 86    Review of Systems:   Unable to assess due to AMS   Past Medical History:  He,  has a past medical history of Depression, Elevated ferritin level, Elevated LFTs, Hypertension, Iron overload (02/06/2019), Laryngopharyngeal reflux (LPR), OSA on CPAP, Tremors of nervous system, and Unintentional weight loss.   Surgical History:   Past Surgical History:  Procedure Laterality Date   BREAST EXCISIONAL BIOPSY Left 1993   neg   COLONOSCOPY     MASTECTOMY PARTIAL / LUMPECTOMY       Social History:   reports that he has never smoked. He has never used smokeless tobacco. He reports that he does not currently use alcohol. He reports that he does not currently use drugs.   Family History:  His family history includes Cancer in his maternal aunt, maternal grandfather, and mother; Hypertension in his father.   Allergies No Known Allergies   Home Medications  Prior to Admission medications   Medication Sig Start Date End Date Taking? Authorizing Provider  amitriptyline (ELAVIL) 10 MG tablet Take 10 mg by mouth at bedtime.   Yes [provider]  ibuprofen (ADVIL) 200 MG tablet Take 200 mg by mouth every 6 (six) hours as needed for fever or mild pain (pain score 1-3).   Yes [provider]  pantoprazole (PROTONIX) 40 MG tablet Take 40 mg by mouth daily.   Yes  [provider]  sertraline (ZOLOFT) 50 MG tablet Take 50 mg by mouth daily.   Yes [provider]     Critical care time: 55 minutes     Harlon Ditty, AGACNP-BC [facility] Prefer epic messenger for cross cover needs If after hours, please call E-link

## 2023-06-20 NOTE — Progress Notes (Signed)
Pt's ex. Wife Dawn updated at bedside on plan of care.  All questions answered.  She is appreciative of update.    Harlon Ditty, AGACNP-BC [facility] Prefer epic messenger for cross cover needs If after hours, please call E-link

## 2023-06-20 NOTE — Assessment & Plan Note (Signed)
Psychiatry reevaluation once able to talk better.

## 2023-06-20 NOTE — Progress Notes (Signed)
[facility] ADULT ICU REPLACEMENT PROTOCOL   The patient does apply for the [facility] Adult ICU Electrolyte Replacment Protocol based on the criteria listed below:   1.Exclusion criteria: TCTS, ECMO, Dialysis, and Myasthenia Gravis patients 2. Is GFR >/= 30 ml/min? Yes.    Patient's GFR today is >60 3. Is SCr </= 2? Yes.   Patient's SCr is 0.7 mg/dL 4. Did SCr increase >/= 0.5 in 24 hours? No. 5.Pt's weight >40kg  Yes.   6. Abnormal electrolyte(s): K 3.0  7. Electrolytes replaced per protocol 8.  Call MD STAT for K+ </= 2.5, Phos </= 1, or Mag </= 1 Physician:    Markus Daft A 06/20/2023 6:35 AM

## 2023-06-20 NOTE — Assessment & Plan Note (Signed)
Protonix.  ?

## 2023-06-20 NOTE — Plan of Care (Signed)
  Problem: Clinical Measurements: Goal: Ability to maintain clinical measurements within normal limits will improve Outcome: Progressing   Problem: Safety: Goal: Ability to remain free from injury will improve Outcome: Progressing   Problem: Skin Integrity: Goal: Risk for impaired skin integrity will decrease Outcome: Progressing   Problem: Education: Goal: Knowledge of General Education information will improve Description: Including pain rating scale, medication(s)/side effects and non-pharmacologic comfort measures Outcome: Not Progressing   Problem: Health Behavior/Discharge Planning: Goal: Ability to manage health-related needs will improve Outcome: Not Progressing

## 2023-06-21 ENCOUNTER — Inpatient Hospital Stay: Payer: Managed Care, Other (non HMO)

## 2023-06-21 DIAGNOSIS — R140 Abdominal distension (gaseous): Secondary | ICD-10-CM

## 2023-06-21 DIAGNOSIS — F10931 Alcohol use, unspecified with withdrawal delirium: Secondary | ICD-10-CM | POA: Diagnosis not present

## 2023-06-21 LAB — AMMONIA: Ammonia: 35 umol/L (ref 9–35)

## 2023-06-21 LAB — RENAL FUNCTION PANEL
Albumin: 3.3 g/dL — ABNORMAL LOW (ref 3.5–5.0)
Anion gap: 13 (ref 5–15)
BUN: 11 mg/dL (ref 6–20)
CO2: 23 mmol/L (ref 22–32)
Calcium: 8.3 mg/dL — ABNORMAL LOW (ref 8.9–10.3)
Chloride: 95 mmol/L — ABNORMAL LOW (ref 98–111)
Creatinine, Ser: 0.72 mg/dL (ref 0.61–1.24)
GFR, Estimated: >60 mL/min (ref >60)
Glucose, Bld: 112 mg/dL — ABNORMAL HIGH (ref 70–99)
Phosphorus: 3.5 mg/dL (ref 2.5–4.6)
Potassium: 3.2 mmol/L — ABNORMAL LOW (ref 3.5–5.1)
Sodium: 131 mmol/L — ABNORMAL LOW (ref 135–145)

## 2023-06-21 LAB — CBC
HCT: 35.5 % — ABNORMAL LOW (ref 39.0–52.0)
Hemoglobin: 12.2 g/dL — ABNORMAL LOW (ref 13.0–17.0)
MCH: 29.9 pg (ref 26.0–34.0)
MCHC: 34.4 g/dL (ref 30.0–36.0)
MCV: 87 fL (ref 80.0–100.0)
Platelets: 190 10*3/uL (ref 150–400)
RBC: 4.08 MIL/uL — ABNORMAL LOW (ref 4.22–5.81)
RDW: 13.6 % (ref 11.5–15.5)
WBC: 10.7 10*3/uL — ABNORMAL HIGH (ref 4.0–10.5)
nRBC: 0 % (ref 0.0–0.2)

## 2023-06-21 LAB — HEPATIC FUNCTION PANEL
ALT: 30 U/L (ref 0–44)
AST: 50 U/L — ABNORMAL HIGH (ref 15–41)
Albumin: 3.3 g/dL — ABNORMAL LOW (ref 3.5–5.0)
Alkaline Phosphatase: 72 U/L (ref 38–126)
Bilirubin, Direct: 0.3 mg/dL — ABNORMAL HIGH (ref 0.0–0.2)
Indirect Bilirubin: 1.2 mg/dL — ABNORMAL HIGH (ref 0.3–0.9)
Total Bilirubin: 1.5 mg/dL — ABNORMAL HIGH (ref <1.2)
Total Protein: 6.4 g/dL — ABNORMAL LOW (ref 6.5–8.1)

## 2023-06-21 LAB — GLUCOSE, CAPILLARY
Glucose-Capillary: 106 mg/dL — ABNORMAL HIGH (ref 70–99)
Glucose-Capillary: 121 mg/dL — ABNORMAL HIGH (ref 70–99)

## 2023-06-21 LAB — MAGNESIUM
Magnesium: 1.6 mg/dL — ABNORMAL LOW (ref 1.7–2.4)
Magnesium: 2.5 mg/dL — ABNORMAL HIGH (ref 1.7–2.4)

## 2023-06-21 LAB — POTASSIUM: Potassium: 3.4 mmol/L — ABNORMAL LOW (ref 3.5–5.1)

## 2023-06-21 LAB — PHOSPHORUS: Phosphorus: 3.4 mg/dL (ref 2.5–4.6)

## 2023-06-21 MED ORDER — MAGNESIUM SULFATE 4 GM/100ML IV SOLN
4.0000 g | Freq: Once | INTRAVENOUS | Status: AC
  Administered 2023-06-21: 4 g via INTRAVENOUS
  Filled 2023-06-21: qty 100

## 2023-06-21 MED ORDER — DIAZEPAM 5 MG/ML IJ SOLN
INTRAMUSCULAR | Status: AC
  Administered 2023-06-21: 5 mg via INTRAVENOUS
  Filled 2023-06-21: qty 2

## 2023-06-21 MED ORDER — DIAZEPAM 5 MG/ML IJ SOLN: 5.0000 mg | Freq: Once | INTRAMUSCULAR | Status: AC

## 2023-06-21 MED ORDER — POTASSIUM CHLORIDE 20 MEQ PO PACK
40.0000 meq | PACK | Freq: Once | ORAL | Status: AC
  Administered 2023-06-21: 40 meq via ORAL
  Filled 2023-06-21: qty 2

## 2023-06-21 MED ORDER — POTASSIUM CHLORIDE 10 MEQ/100ML IV SOLN
10.0000 meq | INTRAVENOUS | Status: AC
  Administered 2023-06-21 (×6): 10 meq via INTRAVENOUS
  Filled 2023-06-21 (×6): qty 100

## 2023-06-21 NOTE — Plan of Care (Signed)
  Problem: Clinical Measurements: Goal: Ability to maintain clinical measurements within normal limits will improve Outcome: Progressing Goal: Will remain free from infection Outcome: Progressing Goal: Diagnostic test results will improve Outcome: Progressing   Problem: Safety: Goal: Ability to remain free from injury will improve Outcome: Progressing   Problem: Skin Integrity: Goal: Risk for impaired skin integrity will decrease Outcome: Progressing   

## 2023-06-21 NOTE — Progress Notes (Signed)
NG tube placement attempted X2, one by this RN and one by NP. During insertion patient became tachypneic and started coughing up blood with O2 saturations dropping. NG tube was removed at this time.

## 2023-06-21 NOTE — Progress Notes (Signed)
NAME:  Ryan Miranda, MRN:  [id number], DOB:  15-Jul-1976, LOS: 4 ADMISSION DATE:  06/15/2023, CONSULTATION DATE:  06/20/2023 REFERRING MD:  Dr. Renae Gloss, CHIEF COMPLAINT:  Severe Alcohol Withdrawal   Brief Pt Description / Synopsis:  47 y.o. male who was to be admitted to behavioral health for suicidal ideation.  While in ED boarding, course complicated by development of severe alcohol withdrawals and Delirium Tremens.  History of Present Illness:  Ryan Miranda is a 47 y.o. male with medical history significant for essential hypertension, depression, OSA on CPAP and alcohol abuse, presented to the emergency room with acute onset of suicidal ideation and on 11/29 which have been recurrent lately and got worse over the last 3 days.  He reported that he loaded his shotgun earlier during the day with a plan to kill himself but was stopped by son.    He was barding in the ED while waiting for behavioral health admission.  He was to be transferred to [facility], however he developed alcohol withdrawal with hallucination and delirium which required medical admission under [facility] service.  ED Course: Initial Vital Signs:  BP 171/131, tachycardia at 140,  Significant Labs: hyponatremia 129 and potassium of 3.1 and chloride 92 calcium 8.7 with total bili of 2.9. CBC showed leukocytosis of 13.2 compared to 15.6 at couple days ago. Tylenol level was less than 10 a couple days ago and salicylate level less than 7. Alcohol level then was 365  Imaging CT Head>>IMPRESSION: No acute intracranial abnormality. Medications Administered:  2L NS bolus, IM phenobarbital, Zyprexa, 2 mg IV Ativan x3 does, 5 mg Haldol, 10 mg Hydralazine, Valium 10 mg x2 doses, 5 mg Haldol   He was admitted by the Hospitalist service for further workup and treatment. Please see "Significant Hospital Events" for further workup and treatment.   Pertinent  Medical History   Past Medical  History:  Diagnosis Date   Depression    Elevated ferritin level    Elevated LFTs    Hypertension    Iron overload 02/06/2019   Laryngopharyngeal reflux (LPR)    OSA on CPAP    Tremors of nervous system    Unintentional weight loss     Micro Data:  12/1: HIV screen>>negative  Antimicrobials:   Anti-infectives (From admission, onward)    None       Significant Hospital Events: Including procedures, antibiotic start and stop dates in addition to other pertinent events   11/29: Presented wit suicidal ideation.  Placed under IVC, To be admitted to behavioral health. 11/30: Was to be transferred to [facility] behavioral health hospital, however CIWA score too high in setting of developing alcohol withdrawal.  Hospitilaist asked to admit for medical management of withdrawals. 12/1: Vital stable, potassium with further decline to 2.7 which is being repleted, magnesium 2.5, improving leukocytosis at 11.3.  CIWA score of 4. 12/2: Vital stable, overnight became very agitated and combative and started on Precedex infusion.  Currently sedated. 12/3: Patient again became very agitated and combative in the evening, started on phenobarbital. 12/4: Remains on Precedex. Mental status fluctuates b/w sedated and agitation. PCCM consulted.  Abdomen distended, KUB concerning for possible SBO. 12/5: Nursing unsuccessful at placing NGT overnight x2, since reported multiple BM's overnight.  Remains on Precedex, weaning down.  Leukocytosis improving.  Interim History / Subjective:  -No significant events noted overnight  -Nursing attempted x2 to place NGT overnight, but unsuccessful ~ ended up having about 6 BM's overnight -Hemodynamically  stable, on Nasal cannula 2L -Remains on Precedex ~ Heavily sedated this morning, not following commands or withdrawing to pain ~ will have nursing attempt to wean down Precedex. -T max overnight 100.5 F, Leukocytosis improved to 10.7 K from 14.8 K ~ Chest x-ray without  evidence of pneumonia    Objective   Blood pressure 120/72, pulse 80, temperature 98.7 F (37.1 C), resp. rate 17, height 5\' 10"  (1.778 m), weight 102.6 kg, SpO2 97%.        Intake/Output Summary (Last 24 hours) at 06/21/2023 0731 Last data filed at 06/21/2023 0549 Gross per 24 hour  Intake 1796.28 ml  Output 400 ml  Net 1396.28 ml   Filed Weights   06/17/23 0929  Weight: 102.6 kg    Examination: General: Acute ill appearing male, laying in bed, in 2 point wrist restraints, sedated on precedex, on nasal cannula, currently protecting his airway, in NAD HENT: Atraumatic, normocephalic, neck supple, no JVD Lungs: Clear diminished breath sounds with mild expiratory wheezing throughout, even, nonlabored, normal effort Cardiovascular:  RRR, s1s2, no M/R/G Abdomen: Abdominal exam is limited given mental status: Distended, slightly taught, no tenderness to palpation, no guarding or rebound tenderness, BS + x4 Extremities: Normal bulk and tone, no deformities, no edema, no cyanosis Neuro: Heavily sedated, currently not following commands or withdrawing to pain, pupils PERRLA (sluggish 1 mm bilaterally) GU: External male catheter in place draining dark yellow urine  Resolved Hospital Problem list     Assessment & Plan:   #Severe Alcohol Withdrawal with Delirium Tremens #Suicidal Ideation PMHx: Depression -Treatment of DT's & metabolic derangements as outlined below -Provide supportive care -Promote normal sleep/wake cycle and family presence -Avoid sedating medications as able -Continue Phenobarbital taper -CIWA protocol -Continue scheduled Valium IV 2.5 mg q6h -Continue high dose Thiamine for total of 3 days, then resume 100 mg daily -Continue Folic acid, MVI -Continue IVC and suicide sitter/precautions -Psych consult for inpatient admission once through DT's  #Hypokalemia #Hyponatremia, likely secondary to beer potomania #Hypomagnesemia -Monitor I&O's / urinary  output -Follow BMP -Ensure adequate renal perfusion -Avoid nephrotoxic agents as able -Replace electrolytes as indicated ~ Pharmacy following for assistance with electrolyte replacement  #Abdominal Distention with concern for Small Bowel Obstruction -With DT's, will be difficult to place NGT to LIS ~ Nursing unable to place NGT x2 attempts ~ may need to consider IR consult for placement ~ however now reported having BM's -NPO and bowel rest -If develops N/V or worsening distention, then will need to consult IR for placement of NGT -Follow serial abdominal exams and serial KUB's -Low threshold for General Surgery consult  #Leukocytosis, likely reactive ~ IMPROVED  UA negative for UTI Chest X-ray without evidence of pneumonia -Monitor fever curve -Trend WBC's & Procalcitonin -Follow cultures as above -Holding off on ABX for now     Pt is critically ill with severe DT's and SBO.  Currently protecting his airway, but high risk for aspiration,  decompensation necessitating intubation, cardiac arrest, and death.  Prognosis is guarded.   Best Practice (right click and "Reselect all SmartList Selections" daily)   Diet/type: NPO DVT prophylaxis: LMWH GI prophylaxis: PPI Lines: N/A Foley:  N/A Code Status:  full code Last date of multidisciplinary goals of care discussion [12/5]  12/5:  Will update family when they arrive at bedside.  Labs   CBC: Recent Labs  Lab 06/17/23 0008 06/17/23 0659 06/18/23 1013 06/20/23 1216 06/21/23 0455  WBC 13.2* 11.3* 14.8* 14.8* 10.7*  HGB 13.6 14.3  13.3 12.5* 12.2*  HCT 39.3 42.0 38.8* 35.7* 35.5*  MCV 86.4 87.3 87.2 85.6 87.0  PLT 256 245 204 193 190    Basic Metabolic Panel: Recent Labs  Lab 06/17/23 0007 06/17/23 0008 06/17/23 1854 06/18/23 1013 06/20/23 0433 06/20/23 2032 06/21/23 0455 06/21/23 0457  NA  --    < > 130* 134* 132* 131* 131*  --   K  --    < > 3.7 3.5 3.0* 3.3* 3.2*  --   CL  --    < > 98 100 96* 93* 95*  --    CO2  --    < > 25 24 26 24 23   --   GLUCOSE  --    < > 142* 119* 105* 113* 112*  --   BUN  --    < > 13 13 10 11 11   --   CREATININE  --    < > 0.78 0.71 0.70 0.70 0.72  --   CALCIUM  --    < > 8.6* 8.8* 8.6* 8.5* 8.3*  --   MG 2.5*  --   --   --  1.6* 1.9  --  1.6*  PHOS  --   --   --   --  1.1* 4.0 3.5  --    < > = values in this interval not displayed.   GFR: Estimated Creatinine Clearance: 134 mL/min (by C-G formula based on SCr of 0.72 mg/dL). Recent Labs  Lab 06/17/23 0659 06/18/23 1013 06/20/23 1216 06/21/23 0455  PROCALCITON  --   --  0.18  --   WBC 11.3* 14.8* 14.8* 10.7*    Liver Function Tests: Recent Labs  Lab 06/15/23 1657 06/17/23 0008 06/20/23 0433 06/21/23 0455  AST 62* 63* 46*  --   ALT 35 33 31  --   ALKPHOS 96 92 81  --   BILITOT 1.6* 2.9* 1.2*  --   PROT 8.7* 7.2 6.3*  --   ALBUMIN 5.1* 4.2 3.2* 3.3*   No results for input(s): "LIPASE", "AMYLASE" in the last 168 hours. Recent Labs  Lab 06/17/23 0008 06/21/23 0455  AMMONIA 57* 35    ABG No results found for: "PHART", "PCO2ART", "PO2ART", "HCO3", "TCO2", "ACIDBASEDEF", "O2SAT"   Coagulation Profile: No results for input(s): "INR", "PROTIME" in the last 168 hours.  Cardiac Enzymes: No results for input(s): "CKTOTAL", "CKMB", "CKMBINDEX", "TROPONINI" in the last 168 hours.  HbA1C: No results found for: "HGBA1C"  CBG: Recent Labs  Lab 06/17/23 0932 06/20/23 1435 06/20/23 2105 06/20/23 2318 06/21/23 0427  GLUCAP 86 140* 116* 126* 106*    Review of Systems:   Unable to assess due to AMS   Past Medical History:  He,  has a past medical history of Depression, Elevated ferritin level, Elevated LFTs, Hypertension, Iron overload (02/06/2019), Laryngopharyngeal reflux (LPR), OSA on CPAP, Tremors of nervous system, and Unintentional weight loss.   Surgical History:   Past Surgical History:  Procedure Laterality Date   BREAST EXCISIONAL BIOPSY Left 1993   neg   COLONOSCOPY      MASTECTOMY PARTIAL / LUMPECTOMY       Social History:   reports that he has never smoked. He has never used smokeless tobacco. He reports that he does not currently use alcohol. He reports that he does not currently use drugs.   Family History:  His family history includes Cancer in his maternal aunt, maternal grandfather, and mother; Hypertension in his father.   Allergies  No Known Allergies   Home Medications  Prior to Admission medications   Medication Sig Start Date End Date Taking? Authorizing Provider  amitriptyline (ELAVIL) 10 MG tablet Take 10 mg by mouth at bedtime.   Yes [provider]  ibuprofen (ADVIL) 200 MG tablet Take 200 mg by mouth every 6 (six) hours as needed for fever or mild pain (pain score 1-3).   Yes [provider]  pantoprazole (PROTONIX) 40 MG tablet Take 40 mg by mouth daily.   Yes [provider]  sertraline (ZOLOFT) 50 MG tablet Take 50 mg by mouth daily.   Yes [provider]     Critical care time: 40 minutes     Harlon Ditty, AGACNP-BC [place] [facility] Prefer epic messenger for cross cover needs If after hours, please call E-link

## 2023-06-22 ENCOUNTER — Inpatient Hospital Stay: Payer: Managed Care, Other (non HMO)

## 2023-06-22 DIAGNOSIS — F10931 Alcohol use, unspecified with withdrawal delirium: Secondary | ICD-10-CM | POA: Diagnosis not present

## 2023-06-22 LAB — RENAL FUNCTION PANEL
Albumin: 3.4 g/dL — ABNORMAL LOW (ref 3.5–5.0)
Anion gap: 13 (ref 5–15)
BUN: 10 mg/dL (ref 6–20)
CO2: 24 mmol/L (ref 22–32)
Calcium: 8.4 mg/dL — ABNORMAL LOW (ref 8.9–10.3)
Chloride: 95 mmol/L — ABNORMAL LOW (ref 98–111)
Creatinine, Ser: 0.78 mg/dL (ref 0.61–1.24)
GFR, Estimated: >60 mL/min (ref >60)
Glucose, Bld: 104 mg/dL — ABNORMAL HIGH (ref 70–99)
Phosphorus: 2.7 mg/dL (ref 2.5–4.6)
Potassium: 3.7 mmol/L (ref 3.5–5.1)
Sodium: 132 mmol/L — ABNORMAL LOW (ref 135–145)

## 2023-06-22 LAB — CBC
HCT: 40 % (ref 39.0–52.0)
Hemoglobin: 13.5 g/dL (ref 13.0–17.0)
MCH: 29.4 pg (ref 26.0–34.0)
MCHC: 33.8 g/dL (ref 30.0–36.0)
MCV: 87.1 fL (ref 80.0–100.0)
Platelets: 203 10*3/uL (ref 150–400)
RBC: 4.59 MIL/uL (ref 4.22–5.81)
RDW: 13.9 % (ref 11.5–15.5)
WBC: 10.4 10*3/uL (ref 4.0–10.5)
nRBC: 0 % (ref 0.0–0.2)

## 2023-06-22 LAB — HEPATIC FUNCTION PANEL
ALT: 26 U/L (ref 0–44)
AST: 37 U/L (ref 15–41)
Albumin: 3.3 g/dL — ABNORMAL LOW (ref 3.5–5.0)
Alkaline Phosphatase: 76 U/L (ref 38–126)
Bilirubin, Direct: 0.3 mg/dL — ABNORMAL HIGH (ref 0.0–0.2)
Indirect Bilirubin: 0.9 mg/dL (ref 0.3–0.9)
Total Bilirubin: 1.2 mg/dL — ABNORMAL HIGH (ref <1.2)
Total Protein: 6.8 g/dL (ref 6.5–8.1)

## 2023-06-22 LAB — LIPASE, BLOOD: Lipase: 35 U/L (ref 11–51)

## 2023-06-22 LAB — MAGNESIUM: Magnesium: 2.6 mg/dL — ABNORMAL HIGH (ref 1.7–2.4)

## 2023-06-22 MED ORDER — SODIUM CHLORIDE 0.9 % IV BOLUS
500.0000 mL | Freq: Once | INTRAVENOUS | Status: AC
  Administered 2023-06-22: 500 mL via INTRAVENOUS

## 2023-06-22 MED ORDER — MORPHINE SULFATE (PF) 2 MG/ML IV SOLN
2.0000 mg | INTRAVENOUS | Status: DC | PRN
  Administered 2023-06-22 – 2023-06-25 (×9): 2 mg via INTRAVENOUS
  Filled 2023-06-22 (×10): qty 1

## 2023-06-22 MED ORDER — LACTATED RINGERS IV BOLUS: 500.0000 mL | Freq: Once | INTRAVENOUS | Status: DC

## 2023-06-22 NOTE — Plan of Care (Signed)
  Problem: Education: Goal: Knowledge of General Education information will improve Description: Including pain rating scale, medication(s)/side effects and non-pharmacologic comfort measures 06/22/2023 0444 by Jimmy Picket, RN Outcome: Progressing 06/22/2023 0443 by Jimmy Picket, RN Outcome: Progressing   Problem: Health Behavior/Discharge Planning: Goal: Ability to manage health-related needs will improve 06/22/2023 0444 by Jimmy Picket, RN Outcome: Progressing 06/22/2023 0443 by Jimmy Picket, RN Outcome: Progressing   Problem: Clinical Measurements: Goal: Ability to maintain clinical measurements within normal limits will improve 06/22/2023 0444 by Jimmy Picket, RN Outcome: Progressing 06/22/2023 0443 by Jimmy Picket, RN Outcome: Progressing Goal: Will remain free from infection 06/22/2023 0444 by Jimmy Picket, RN Outcome: Progressing 06/22/2023 0443 by Jimmy Picket, RN Outcome: Progressing Goal: Diagnostic test results will improve 06/22/2023 0444 by Jimmy Picket, RN Outcome: Progressing 06/22/2023 0443 by Jimmy Picket, RN Outcome: Progressing Goal: Respiratory complications will improve 06/22/2023 0444 by Jimmy Picket, RN Outcome: Progressing 06/22/2023 0443 by Jimmy Picket, RN Outcome: Progressing Goal: Cardiovascular complication will be avoided 06/22/2023 0444 by Jimmy Picket, RN Outcome: Progressing 06/22/2023 0443 by Jimmy Picket, RN Outcome: Progressing   Problem: Activity: Goal: Risk for activity intolerance will decrease 06/22/2023 0444 by Jimmy Picket, RN Outcome: Progressing 06/22/2023 0443 by Jimmy Picket, RN Outcome: Progressing   Problem: Nutrition: Goal: Adequate nutrition will be maintained 06/22/2023 0444 by Jimmy Picket, RN Outcome: Progressing 06/22/2023 0443 by Jimmy Picket, RN Outcome: Progressing   Problem: Coping: Goal: Level of anxiety will  decrease 06/22/2023 0444 by Jimmy Picket, RN Outcome: Progressing 06/22/2023 0443 by Jimmy Picket, RN Outcome: Progressing   Problem: Elimination: Goal: Will not experience complications related to bowel motility 06/22/2023 0444 by Jimmy Picket, RN Outcome: Progressing 06/22/2023 0443 by Jimmy Picket, RN Outcome: Progressing Goal: Will not experience complications related to urinary retention 06/22/2023 0444 by Jimmy Picket, RN Outcome: Progressing 06/22/2023 0443 by Jimmy Picket, RN Outcome: Progressing   Problem: Pain Management: Goal: General experience of comfort will improve 06/22/2023 0444 by Jimmy Picket, RN Outcome: Progressing 06/22/2023 0443 by Jimmy Picket, RN Outcome: Progressing   Problem: Safety: Goal: Ability to remain free from injury will improve 06/22/2023 0444 by Jimmy Picket, RN Outcome: Progressing 06/22/2023 0443 by Jimmy Picket, RN Outcome: Progressing   Problem: Skin Integrity: Goal: Risk for impaired skin integrity will decrease 06/22/2023 0444 by Jimmy Picket, RN Outcome: Progressing 06/22/2023 0443 by Jimmy Picket, RN Outcome: Progressing

## 2023-06-22 NOTE — Progress Notes (Signed)
NAME:  Dray Lerew, MRN:  [id number], DOB:  [birth year], LOS: 5 ADMISSION DATE:  06/15/2023, CONSULTATION DATE:  06/20/2023 REFERRING MD:  Dr. Renae Gloss, CHIEF COMPLAINT:  Severe Alcohol Withdrawal   Brief Pt Description / Synopsis:  47 y.o. male who was to be admitted to behavioral health for suicidal ideation.  While in ED boarding, course complicated by development of severe alcohol withdrawals and Delirium Tremens.  History of Present Illness:  Ryan Miranda is a 47 y.o. male with medical history significant for essential hypertension, depression, OSA on CPAP and alcohol abuse, presented to the emergency room with acute onset of suicidal ideation and on 11/29 which have been recurrent lately and got worse over the last 3 days.  He reported that he loaded his shotgun earlier during the day with a plan to kill himself but was stopped by son.    He was barding in the ED while waiting for behavioral health admission.  He was to be transferred to [facility], however he developed alcohol withdrawal with hallucination and delirium which required medical admission under [facility] service.  ED Course: Initial Vital Signs:  BP 171/131, tachycardia at 140,  Significant Labs: hyponatremia 129 and potassium of 3.1 and chloride 92 calcium 8.7 with total bili of 2.9. CBC showed leukocytosis of 13.2 compared to 15.6 at couple days ago. Tylenol level was less than 10 a couple days ago and salicylate level less than 7. Alcohol level then was 365  Imaging CT Head>>IMPRESSION: No acute intracranial abnormality. Medications Administered:  2L NS bolus, IM phenobarbital, Zyprexa, 2 mg IV Ativan x3 does, 5 mg Haldol, 10 mg Hydralazine, Valium 10 mg x2 doses, 5 mg Haldol   He was admitted by the Hospitalist service for further workup and treatment. Please see "Significant Hospital Events" for further workup and treatment.   Pertinent  Medical History   Past Medical  History:  Diagnosis Date   Depression    Elevated ferritin level    Elevated LFTs    Hypertension    Iron overload 02/06/2019   Laryngopharyngeal reflux (LPR)    OSA on CPAP    Tremors of nervous system    Unintentional weight loss     Micro Data:  12/1: HIV screen>>negative  Antimicrobials:   Anti-infectives (From admission, onward)    None       Significant Hospital Events: Including procedures, antibiotic start and stop dates in addition to other pertinent events   11/29: Presented wit suicidal ideation.  Placed under IVC, To be admitted to behavioral health. 11/30: Was to be transferred to [facility] behavioral health hospital, however CIWA score too high in setting of developing alcohol withdrawal.  Hospitilaist asked to admit for medical management of withdrawals. 12/1: Vital stable, potassium with further decline to 2.7 which is being repleted, magnesium 2.5, improving leukocytosis at 11.3.  CIWA score of 4. 12/2: Vital stable, overnight became very agitated and combative and started on Precedex infusion.  Currently sedated. 12/3: Patient again became very agitated and combative in the evening, started on phenobarbital. 12/4: Remains on Precedex. Mental status fluctuates b/w sedated and agitation. PCCM consulted.  Abdomen distended, KUB concerning for possible SBO. 12/5: Nursing unsuccessful at placing NGT overnight x2, since reported multiple BM's overnight.  Remains on Precedex, weaning down.  Leukocytosis improving. 12/6 DT slowly improving, remains on precedex  Interim History / Subjective:  +ABD pain [place] abd pending +DT's-->On precedex Risk for aspiration      Objective  Blood pressure 114/83, pulse 95, temperature 99.8 F (37.7 C), temperature source Oral, resp. rate (!) 25, height 5\' 10"  (1.778 m), weight 103.8 kg, SpO2 94%.        Intake/Output Summary (Last 24 hours) at 06/22/2023 0724 Last data filed at 06/22/2023 0700 Gross per 24 hour  Intake 1736.84  ml  Output 750 ml  Net 986.84 ml   Filed Weights   06/17/23 0929 06/22/23 0500  Weight: 102.6 kg 103.8 kg      Review of Systems: confused Other:  All other systems negative   Physical Examination:   General Appearance: minimal distress  EYES PERRLA, EOM intact.   NECK Supple, No JVD Pulmonary: normal breath sounds, No wheezing.  CardiovascularNormal S1,S2.  No m/r/g.   Abdomen: Benign, Soft, +tender +distention Neurology UE/LE 5/5 strength, no focal deficits Ext pulses intact, cap refill intact ALL OTHER ROS ARE NEGATIVE    Assessment & Plan:   Admitted for Severe Alcohol Withdrawal with Delirium Tremens Suicidal Ideation PMHx: Depression  SEVERE ALCOHOL WITHDRAWAL -High risk for intubation  -high risk for aspiration -Therapy with Thiamine and MVI -CIWA Protocol -Precedex as needed -Promote normal sleep/wake cycle and family presence -Avoid sedating medications as able -Continue Phenobarbital taper -Continue scheduled Valium IV 2.5 mg q6h -Continue Folic acid, MVI -Continue IVC and suicide sitter/precautions -Psych consult for inpatient admission once through DT's  ELECTROLYTES -follow labs as needed -replace as needed -pharmacy consultation and following Hypokalemia Hyponatremia, likely secondary to beer potomania Hypomagnesemia   Abdominal Distention with concern for Small Bowel Obstruction -With DT's, will be difficult to place NGT to LIS ~ Nursing unable to place NGT x2 attempts ~ may need to consider IR consult for placement ~ however now reported having BM's -Follow serial abdominal exams and serial KUB's -Low threshold for General Surgery consult   NEUROLOGY ACUTE METABOLIC ENCEPHALOPATHY DUE TO SEVERE DT's   Pt is critically ill with severe DT's and SBO.  Currently protecting his airway, but high risk for aspiration,  decompensation necessitating intubation, cardiac arrest, and death.  Prognosis is guarded.   Best Practice (right click  and "Reselect all SmartList Selections" daily)   Diet/type: NPO DVT prophylaxis: LMWH GI prophylaxis: PPI Lines: N/A Foley:  N/A Code Status:  full code Last date of multidisciplinary goals of care discussion [12/5]  12/5:  Will update family when they arrive at bedside.  Labs   CBC: Recent Labs  Lab 06/17/23 0659 06/18/23 1013 06/20/23 1216 06/21/23 0455 06/22/23 0358  WBC 11.3* 14.8* 14.8* 10.7* 10.4  HGB 14.3 13.3 12.5* 12.2* 13.5  HCT 42.0 38.8* 35.7* 35.5* 40.0  MCV 87.3 87.2 85.6 87.0 87.1  PLT 245 204 193 190 203    Basic Metabolic Panel: Recent Labs  Lab 06/18/23 1013 06/20/23 0433 06/20/23 2032 06/21/23 0455 06/21/23 0457 06/21/23 1946 06/22/23 0358  NA 134* 132* 131* 131*  --   --  132*  K 3.5 3.0* 3.3* 3.2*  --  3.4* 3.7  CL 100 96* 93* 95*  --   --  95*  CO2 24 26 24 23   --   --  24  GLUCOSE 119* 105* 113* 112*  --   --  104*  BUN 13 10 11 11   --   --  10  CREATININE 0.71 0.70 0.70 0.72  --   --  0.78  CALCIUM 8.8* 8.6* 8.5* 8.3*  --   --  8.4*  MG  --  1.6* 1.9  --  1.6*  2.5* 2.6*  PHOS  --  1.1* 4.0 3.5  --  3.4 2.7   GFR: Estimated Creatinine Clearance: 134.8 mL/min (by C-G formula based on SCr of 0.78 mg/dL). Recent Labs  Lab 06/18/23 1013 06/20/23 1216 06/21/23 0455 06/22/23 0358  PROCALCITON  --  0.18  --   --   WBC 14.8* 14.8* 10.7* 10.4    Liver Function Tests: Recent Labs  Lab 06/15/23 1657 06/17/23 0008 06/20/23 0433 06/21/23 0455 06/22/23 0358  AST 62* 63* 46* 50*  --   ALT 35 33 31 30  --   ALKPHOS 96 92 81 72  --   BILITOT 1.6* 2.9* 1.2* 1.5*  --   PROT 8.7* 7.2 6.3* 6.4*  --   ALBUMIN 5.1* 4.2 3.2* 3.3*  3.3* 3.4*   No results for input(s): "LIPASE", "AMYLASE" in the last 168 hours. Recent Labs  Lab 06/17/23 0008 06/21/23 0455  AMMONIA 57* 35     DVT/GI PRX  assessed I Assessed the need for Labs I Assessed the need for Foley I Assessed the need for Central Venous Line Family Discussion when  available I Assessed the need for Mobilization I made an Assessment of medications to be adjusted accordingly Safety Risk assessment completed  CASE DISCUSSED IN MULTIDISCIPLINARY ROUNDS WITH ICU TEAM     Critical Care Time devoted to patient care services described in this note is 45 minutes.  Critical care was necessary to treat /prevent imminent and life-threatening deterioration.   Lucie Leather, M.D.  [facility]  Medical Director [facility] Medical Director [facility] Cardio-Pulmonary Department

## 2023-06-22 NOTE — Plan of Care (Signed)
  Problem: Nutrition: Goal: Adequate nutrition will be maintained Outcome: Not Progressing    NG tube placed this shift

## 2023-06-22 NOTE — Progress Notes (Signed)
CHIEF COMPLAINT:   Abd distention   Severe DT's +abd pain       REVIEW OF SYSTEMS   PATIENT IS UNABLE TO PROVIDE COMPLETE REVIEW OF SYSTEMS DUE TO SEVERE CRITICAL ILLNESS    PHYSICAL EXAMINATION: GENERAL:critically ill appearing GASTROINTESTINAL: +distended. RUQ pain on palpation       VITALS:  temperature is 97.9 F (36.6 C). His blood pressure is 142/81 (abnormal) and his pulse is 97. His respiration is 30 (abnormal) and oxygen saturation is 100%.    I personally reviewed Labs under Results section.   Radiology Reports  Imaging Results (Last 48 hours)  CT ANGIO HEAD NECK W WO CM   Result Date: 06/22/2023 CLINICAL DATA:  Provided history: Stroke, hemorrhagic. Evaluate for vertebrobasilar stenosis. EXAM: CT ANGIOGRAPHY HEAD AND NECK WITH AND WITHOUT CONTRAST TECHNIQUE: Multidetector CT imaging of the head and neck was performed using the standard protocol during bolus administration of intravenous contrast. Multiplanar CT image reconstructions and MIPs were obtained to evaluate the vascular anatomy. Carotid stenosis measurements (when applicable) are obtained utilizing NASCET criteria, using the distal internal carotid diameter as the denominator. RADIATION DOSE REDUCTION: This exam was performed according to the departmental dose-optimization program which includes automated exposure control, adjustment of the mA and/or kV according to patient size and/or use of iterative reconstruction technique. CONTRAST:  75mL OMNIPAQUE IOHEXOL 350 MG/ML SOLN COMPARISON:  Brain MRI 06/22/2023. FINDINGS: CT HEAD FINDINGS Brain: Generalized cerebral and cerebellar atrophy. Prominence of the ventricles and sulci appears commensurate. Acute parenchymal hemorrhage within the right occipital lobe with surrounding edema, grossly unchanged from the brain MRI and non-contrast head CT examinations performed earlier today. Chronic cortical/subcortical left MCA territory infarcts within the left frontal  lobe/insula and left parietal lobe. Chronic lacunar infarcts again within/bout the bilateral basal ganglia. Background cerebral white matter and pontine chronic small vessel disease, overall better delineated on the brain MRI performed earlier today. Small chronic infarcts within the left cerebellar hemisphere. No midline shift. Vascular: No hyperdense vessel.  Atherosclerotic calcifications. Skull: No acute calvarial fracture or aggressive osseous lesion. Prior right mastoidectomy. Sinuses/Orbits: No orbital mass or acute orbital finding. No significant paranasal sinus disease. Review of the MIP images confirms the above findings CTA NECK FINDINGS Aortic arch: Standard aortic branching. Atherosclerotic plaque within the visualized thoracic aorta and proximal major branch vessels of the neck. No innominate or proximal right subclavian artery stenosis. Severe, near occlusive (greater than 95%) atherosclerotic stenosis of the proximal left subclavian artery. Right carotid system: CCA and ICA patent within the neck without measurable stenosis. Mild atherosclerotic plaque scattered within the CCA and about the carotid bifurcation. Left carotid system: CCA and ICA patent within the neck. Atherosclerotic plaque scattered throughout the CCA, about the carotid bifurcation and within the proximal ICA. Most notably, atherosclerotic plaque within the proximal ICA results in less than 50% stenosis Vertebral arteries: The vertebral arteries are patent within the neck. Moderate/severe atherosclerotic stenosis at the right vertebral artery origin. Apparent moderate stenosis within the left vertebral artery V1 segment (however, this could potentially be artifactual and related to beam hardening artifact at this level). Moderate stenosis of the left vertebral artery V2 segment at the C4-C5 level. Skeleton: Prior ACDF at the C4-C7 levels. Hardware remains at C4-C5 and C6-C7. Superimposed cervical spondylosis. No acute fracture or  aggressive osseous lesion. Other neck: No neck mass or cervical lymphadenopathy. Upper chest: No consolidation within the imaged lung apices. Review of the MIP images confirms the above findings CTA HEAD FINDINGS Anterior  circulation: The intracranial internal carotid arteries are patent. Atherosclerotic plaque within both vessels with no more than mild stenosis. The M1 middle cerebral arteries are patent. Mild stenosis within the right middle cerebral artery proximal M1 segment. Atherosclerotic irregularity of the M2 and more distal MCA vessels, bilaterally. No M2 proximal branch occlusion or high-grade proximal stenosis. The anterior cerebral arteries are patent. No intracranial aneurysm is identified. Posterior circulation: The intracranial vertebral arteries are patent. 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 The posterior cerebral arteries are patent. Atherosclerotic irregularity of both vessels. Most notably, there is a severe stenosis within the left PCA P2 segment. Posterior communicating arteries are present bilaterally. Venous sinuses: Evaluation for dural venous sinus thrombosis is limited by contrast timing. Anatomic variants: As described. Review of the MIP images confirms the above findings. CTA head impression #2 will be called to the ordering clinician or representative by the Radiologist Assistant, and communication documented in the PACS or [employer]. IMPRESSION: Non-contrast head CT: 1. Acute parenchymal hemorrhage within the right occipital lobe with surrounding edema, unchanged from the brain MRI and head CT examinations performed earlier today. 2. Background parenchymal atrophy, chronic small vessel ischemic disease and chronic infarcts as described. CTA neck: 1. The common carotid and internal carotid arteries are patent within the neck without hemodynamically significant stenosis (50% or greater). Atherosclerotic plaque bilaterally, as described. 2. The  vertebral arteries are patent within the neck. Moderate/severe atherosclerotic narrowing at the right vertebral artery origin. Apparent moderate stenosis within the left vertebral artery V1 segment (however, this could potentially be artifactual and related to beam hardening artifact at this level). Moderate stenosis within the left vertebral artery V2 segment. 3. 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 4. Aortic Atherosclerosis (ICD10-I70.0). CTA head: 1. Intracranial atherosclerotic disease with multifocal stenoses, most notably as follows. 2. 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 3. Severe stenosis within the left posterior cerebral artery P2 segment. Electronically Signed   By: Jackey Loge D.O.   On: 06/22/2023 09:03    MR BRAIN WO CONTRAST   Addendum Date: 06/22/2023   ADDENDUM REPORT: 06/22/2023 05:52 ADDENDUM: Study discussed by telephone with Dr. Dolores Frame in the ED on 06/22/2023 at 0547 hours. Electronically Signed   By: Odessa Fleming M.D.   On: 06/22/2023 05:52    Result Date: 06/22/2023 CLINICAL DATA:  47 year old male with altered mental status. Right occipital lobe hemorrhage on head CT this morning. EXAM: MRI HEAD WITHOUT CONTRAST TECHNIQUE: Multiplanar, multiecho pulse sequences of the brain and surrounding structures were obtained without intravenous contrast. COMPARISON:  Head CT 0219 hours today.  Prior brain MRI 10/13/2019. FINDINGS: Brain: Intra-axial hemorrhage within the medial right occipital lobe is T2 and T1 signal heterogeneous, mostly T1 isointense (sagittal image 11). The blood products in compass roughly 19 x 23 by 35 mm on MRI (AP by transverse by CC) for an estimated volume of 7-8 mL. Surrounding T2 and FLAIR hyperintense vasogenic edema. No intraventricular extension. No convincing extra-axial extension. DWI susceptibility in the area of hemorrhage with no larger area of abnormal diffusion. No other  restricted diffusion. No midline shift or significant intracranial mass effect. Cervicomedullary junction and pituitary are within normal limits. Ventricular enlargement is chronic but mildly increased since 2021 and favored to be ex vacuo in nature. On SWI chronic microhemorrhages in the bilateral cerebellum, along with increased small chronic cerebellar infarcts since 2021. Posterior right temporal lobe, and both frontal lobes are  new, for about a half dozen areas of chronic cerebral blood products overall. Superimposed chronic left MCA territory infarction and encephalomalacia which is evolved from acute findings on the 2021 MRI. Additional mild scattered cerebral but moderate pontine chronic T2 and FLAIR heterogeneity. Vascular: Partial loss of vertebrobasilar flow voids is new since 2021 and suggests poor flow in the posterior circulation such as on series 15, image 80. Anterior circulation flow voids appear stable. Skull and upper cervical spine: Negative. Visualized bone marrow signal is within normal limits. Sinuses/Orbits: Stable, negative. Other: Chronic right mastoidectomy. IMPRESSION: 1. Evidence of poor flow in the vertebrobasilar arteries, new from a 2021 MRI. CTA would best evaluate further. And progressed small chronic cerebellar infarcts and microhemorrhages since that time. 2. Right occipital lobe intra-axial hemorrhage redemonstrated, estimated 7-8 mL by MRI. Regional vasogenic edema with no significant mass effect, no IVH or extra-axial extension. 3. Progressed chronic Left MCA territory ischemia and small number of bilateral cerebral microhemorrhages also since 2021. The SWI appearance does not rise to the level of amyloid angiopathy at this time. Electronically Signed: By: Odessa Fleming M.D. On: 06/22/2023 05:44    DG Abd 1 View   Result Date: 06/22/2023 CLINICAL DATA:  MRI clearance. EXAM: ABDOMEN - 1 VIEW COMPARISON:  06/22/2023 FINDINGS: One-view abdomen shows nonspecific bowel gas pattern. No  unexpected radiopaque soft tissue foreign body. Telemetry leads overlie the chest. IMPRESSION: No unexpected radiopaque soft tissue foreign body. Electronically Signed   By: Kennith Center M.D.   On: 06/22/2023 05:14    CT Head Wo Contrast   Result Date: 06/22/2023 CLINICAL DATA:  Altered mental status EXAM: CT HEAD WITHOUT CONTRAST TECHNIQUE: Contiguous axial images were obtained from the base of the skull through the vertex without intravenous contrast. RADIATION DOSE REDUCTION: This exam was performed according to the departmental dose-optimization program which includes automated exposure control, adjustment of the mA and/or kV according to patient size and/or use of iterative reconstruction technique. COMPARISON:  None Available. FINDINGS: Brain: There is acute intraparenchymal hemorrhage within the right occipital lobe, measuring 1.8 x 1.3 cm. Mild surrounding edema. No midline shift or other mass effect. There is generalized volume loss. Old left MCA territory infarct. Vascular: There is atherosclerotic calcification of both internal carotid arteries at the skull base. Skull: Remote right mastoidectomy Sinuses/Orbits: No acute finding. Other: None. IMPRESSION: 1. Acute intraparenchymal hemorrhage within the right occipital lobe, measuring 1.8 x 1.3 cm. Mild surrounding edema. No midline shift or other mass effect. 2. Old left MCA territory infarct. Critical Value/emergent results were called by telephone at the time of interpretation on 06/22/2023 at 2:51 am to provider JADE SUNG , who verbally acknowledged these results. Electronically Signed   By: Deatra Robinson M.D.   On: 06/22/2023 02:51    DG Chest Port 1 View   Result Date: 06/22/2023 CLINICAL DATA:  Altered mental status. EXAM: PORTABLE CHEST 1 VIEW COMPARISON:  December 20, 2022 FINDINGS: Limited study secondary to patient rotation. The heart size and mediastinal contours are within normal limits. Low lung volumes are noted with very mild atelectasis  and/or infiltrate seen along the periphery of the right lung base. No pleural effusion or pneumothorax is identified. Radiopaque fusion plates and screws are seen overlying the cervical spine with multilevel degenerative changes noted throughout the thoracic spine. IMPRESSION: Low lung volumes with very mild lateral right basilar atelectasis and/or infiltrate. Electronically Signed   By: Aram Candela M.D.   On: 06/22/2023 01:45  Assessment/Plan:  SBO/ILEUS Check LIPASE and CT ABD PELVIS ORDERED US AB PENDING   Additional  Critical Care Time devoted to patient care services described in this note is 25 mins minutes.  Critical care was necessary to treat /prevent imminent and life-threatening deterioration.     Lucie Leather, M.D.  [facility]  Medical Director [facility] Medical Director [facility] Cardio-Pulmonary Department

## 2023-06-22 NOTE — Progress Notes (Addendum)
Initial Nutrition Assessment  DOCUMENTATION CODES:   Obesity unspecified  INTERVENTION:   Recommend TPN if unable to initiate tube feeds or an oral diet by 12/7  If tube feeds initiated, recommend:  Osmolite 1.5@60ml /hr- Initiate at 33ml/hr, once tolerating, increase by 10ml/hr q 8 hours until goal rate is reached.   ProSource TF 20- Give 60ml daily via tube, each supplement provides 80kcal and 20g of protein.   Free water flushes 30ml q4 hours to maintain tube patency   Regimen provides 2240kcal/day, 110g/day protein and 1265ml/day of free water   Daily weights   NUTRITION DIAGNOSIS:   Inadequate oral intake related to lethargy/confusion, altered GI function as evidenced by NPO status.  GOAL:   Patient will meet greater than or equal to 90% of their needs  MONITOR:   Labs, Weight trends, I & O's, Skin  REASON FOR ASSESSMENT:   NPO/Clear Liquid Diet    ASSESSMENT:   47 y/o male with h/o depression, SI, GERD, OSA, etoh abuse, cirrhosis, HTN, breast mass s/p mastectomy (90s), asthma and tremors who is admitted from [facility] with severe alcohol withdrawal with delirium tremens and abdominal distension.  -Pt s/p fluoroscopy guided NGT placement today (gastric)  Visited pt's room today. Pt with AMS and is unable to provide any history. Pt noted to be eating some meals and snacks while in behavior health. Pt eating ~25% of his clear liquid diet yesterday but is unable to take anything by mouth today r/t his AMS. Pt noted to have abdominal distension. Serial KUBs reporting SBO vs ileus. Pt is having bowel function. No emesis noted. Pt s/p CT scan today; results pending. NGT unable to be placed by nursing after several attempts; NGT was placed in fluoroscopy today. RN reports pt has had ~1.0L output from his NGT since placement. Pt has now been without adequate nutrition for ~5 days. Will plan on tube feeds vs TPN pending CT scan results. Lipase was within normal limits. Pt is at high  refeed risk. Pt receiving IV thiamine. Per chart, pt appears weight stable at baseline.    Medications reviewed and include: lovenox, MVI, protonix, thiamine  Labs reviewed: Na 132(L), K 3.7 wnl, P 2.7 wnl, Mg 2.6(H), tbili 1.2(H) Ammonia- 35 wnl- 12/5 Lipase- 35 wnl Cbgs- 121, 106 x 24 hrs   NUTRITION - FOCUSED PHYSICAL EXAM:  Flowsheet Row Most Recent Value  Orbital Region No depletion  Upper Arm Region No depletion  Thoracic and Lumbar Region No depletion  Buccal Region No depletion  Temple Region No depletion  Clavicle Bone Region No depletion  Clavicle and Acromion Bone Region No depletion  Scapular Bone Region No depletion  Dorsal Hand No depletion  Patellar Region No depletion  Anterior Thigh Region No depletion  Posterior Calf Region No depletion  Edema (RD Assessment) None  Hair Reviewed  Eyes Reviewed  Mouth Reviewed  Skin Reviewed  Nails Reviewed   Diet Order:   Diet Order             Diet clear liquid Room service appropriate? Yes; Fluid consistency: Thin  Diet effective now                  EDUCATION NEEDS:   No education needs have been identified at this time  Skin:  Skin Assessment: Reviewed RN Assessment  Last BM:  12/6- type 6  Height:   Ht Readings from Last 1 Encounters:  06/18/23 5\' 10"  (1.778 m)    Weight:   Wt Readings  from Last 1 Encounters:  06/22/23 103.8 kg    Ideal Body Weight:  75 kg  BMI:  Body mass index is 32.83 kg/m.  Estimated Nutritional Needs:   Kcal:  2100-2400kcal/day  Protein:  105-120g/day  Fluid:  2.3-2.6L/day  Betsey Holiday MS, RD, LDN Please refer to [facility] for RD and/or RD on-call/weekend/after hours pager

## 2023-06-22 NOTE — Procedures (Signed)
Successful image guided placement of NG tube via left nostril.   Tip lies within the gastric body, upon passage through the GE junction there was immediate return of clear, dark green bilious appearing fluid. ~300 cc fluid was removed before the NG was clamped in order for patient to undergo CT scan.  Tube is ready for immediate use.  Images available for review under imaging section of Epic.  Lynnette Caffey, PA-C

## 2023-06-23 ENCOUNTER — Other Ambulatory Visit: Payer: Self-pay

## 2023-06-23 DIAGNOSIS — F10931 Alcohol use, unspecified with withdrawal delirium: Secondary | ICD-10-CM | POA: Diagnosis not present

## 2023-06-23 LAB — BASIC METABOLIC PANEL
Anion gap: 11 (ref 5–15)
BUN: 8 mg/dL (ref 6–20)
CO2: 28 mmol/L (ref 22–32)
Calcium: 8.4 mg/dL — ABNORMAL LOW (ref 8.9–10.3)
Chloride: 97 mmol/L — ABNORMAL LOW (ref 98–111)
Creatinine, Ser: 0.76 mg/dL (ref 0.61–1.24)
GFR, Estimated: >60 mL/min (ref >60)
Glucose, Bld: 104 mg/dL — ABNORMAL HIGH (ref 70–99)
Potassium: 3.1 mmol/L — ABNORMAL LOW (ref 3.5–5.1)
Sodium: 136 mmol/L (ref 135–145)

## 2023-06-23 LAB — RENAL FUNCTION PANEL
Albumin: 3 g/dL — ABNORMAL LOW (ref 3.5–5.0)
Anion gap: 8 (ref 5–15)
BUN: 8 mg/dL (ref 6–20)
CO2: 28 mmol/L (ref 22–32)
Calcium: 8 mg/dL — ABNORMAL LOW (ref 8.9–10.3)
Chloride: 100 mmol/L (ref 98–111)
Creatinine, Ser: 0.76 mg/dL (ref 0.61–1.24)
GFR, Estimated: >60 mL/min (ref >60)
Glucose, Bld: 120 mg/dL — ABNORMAL HIGH (ref 70–99)
Phosphorus: 2 mg/dL — ABNORMAL LOW (ref 2.5–4.6)
Potassium: 2.9 mmol/L — ABNORMAL LOW (ref 3.5–5.1)
Sodium: 136 mmol/L (ref 135–145)

## 2023-06-23 LAB — CBC
HCT: 32.5 % — ABNORMAL LOW (ref 39.0–52.0)
Hemoglobin: 11.3 g/dL — ABNORMAL LOW (ref 13.0–17.0)
MCH: 30 pg (ref 26.0–34.0)
MCHC: 34.8 g/dL (ref 30.0–36.0)
MCV: 86.2 fL (ref 80.0–100.0)
Platelets: 277 10*3/uL (ref 150–400)
RBC: 3.77 MIL/uL — ABNORMAL LOW (ref 4.22–5.81)
RDW: 13.8 % (ref 11.5–15.5)
WBC: 11.4 10*3/uL — ABNORMAL HIGH (ref 4.0–10.5)
nRBC: 0 % (ref 0.0–0.2)

## 2023-06-23 LAB — MAGNESIUM
Magnesium: 1.9 mg/dL (ref 1.7–2.4)
Magnesium: 1.9 mg/dL (ref 1.7–2.4)

## 2023-06-23 LAB — PHOSPHORUS: Phosphorus: 3.4 mg/dL (ref 2.5–4.6)

## 2023-06-23 MED ORDER — MAGNESIUM SULFATE 2 GM/50ML IV SOLN
2.0000 g | Freq: Once | INTRAVENOUS | Status: AC
  Administered 2023-06-23: 2 g via INTRAVENOUS
  Filled 2023-06-23: qty 50

## 2023-06-23 MED ORDER — SODIUM CHLORIDE 0.9 % IV BOLUS
500.0000 mL | Freq: Once | INTRAVENOUS | Status: AC
  Administered 2023-06-23: 500 mL via INTRAVENOUS

## 2023-06-23 MED ORDER — SODIUM CHLORIDE 0.9% FLUSH
10.0000 mL | Freq: Two times a day (BID) | INTRAVENOUS | Status: DC
Start: 2023-06-23 — End: 2023-06-30
  Administered 2023-06-23 – 2023-06-27 (×8): 10 mL
  Administered 2023-06-28: 30 mL
  Administered 2023-06-28 – 2023-06-30 (×4): 10 mL

## 2023-06-23 MED ORDER — LORAZEPAM 2 MG/ML IJ SOLN
2.0000 mg | Freq: Once | INTRAMUSCULAR | Status: AC
  Administered 2023-06-23: 2 mg via INTRAVENOUS
  Filled 2023-06-23: qty 1

## 2023-06-23 MED ORDER — POTASSIUM PHOSPHATES 15 MMOLE/5ML IV SOLN
30.0000 mmol | Freq: Once | INTRAVENOUS | Status: AC
  Administered 2023-06-23: 30 mmol via INTRAVENOUS
  Filled 2023-06-23: qty 10

## 2023-06-23 MED ORDER — SODIUM CHLORIDE 0.9% FLUSH: 10.0000 mL | INTRAVENOUS | Status: DC | PRN

## 2023-06-23 MED ORDER — POTASSIUM CHLORIDE 10 MEQ/50ML IV SOLN
10.0000 meq | INTRAVENOUS | Status: AC
  Administered 2023-06-23 – 2023-06-24 (×6): 10 meq via INTRAVENOUS
  Filled 2023-06-23 (×6): qty 50

## 2023-06-23 NOTE — Consult Note (Signed)
The psych team attempted to round on him, sterile procedure in place.  Precedex continues and psych will evaluate when he is alert and verbal  Nanine Means, PMHNP

## 2023-06-23 NOTE — Plan of Care (Signed)
  Problem: Clinical Measurements: Goal: Ability to maintain clinical measurements within normal limits will improve Outcome: Progressing Goal: Will remain free from infection Outcome: Progressing Goal: Diagnostic test results will improve Outcome: Progressing Goal: Respiratory complications will improve Outcome: Progressing Goal: Cardiovascular complication will be avoided Outcome: Progressing   Problem: Activity: Goal: Risk for activity intolerance will decrease Outcome: Progressing   Problem: Elimination: Goal: Will not experience complications related to bowel motility Outcome: Progressing Goal: Will not experience complications related to urinary retention Outcome: Progressing   Problem: Pain Management: Goal: General experience of comfort will improve Outcome: Progressing   Problem: Safety: Goal: Ability to remain free from injury will improve Outcome: Progressing   Problem: Skin Integrity: Goal: Risk for impaired skin integrity will decrease Outcome: Progressing   Problem: Education: Goal: Knowledge of General Education information will improve Description: Including pain rating scale, medication(s)/side effects and non-pharmacologic comfort measures Outcome: Not Progressing   Problem: Health Behavior/Discharge Planning: Goal: Ability to manage health-related needs will improve Outcome: Not Progressing   Problem: Nutrition: Goal: Adequate nutrition will be maintained Outcome: Not Progressing   Problem: Coping: Goal: Level of anxiety will decrease Outcome: Not Progressing

## 2023-06-23 NOTE — Progress Notes (Signed)
Peripherally Inserted Central Catheter Placement  The IV Nurse has discussed with the patient and/or persons authorized to consent for the patient, the purpose of this procedure and the potential benefits and risks involved with this procedure.  The benefits include less needle sticks, lab draws from the catheter, and the patient may be discharged home with the catheter. Risks include, but not limited to, infection, bleeding, blood clot (thrombus formation), and puncture of an artery; nerve damage and irregular heartbeat and possibility to perform a PICC exchange if needed/ordered by physician.  Alternatives to this procedure were also discussed.  Bard Power PICC patient education guide, fact sheet on infection prevention and patient information card has been provided to patient /or left at bedside.  Ex- wife at bedside states she is HCPOA.  PICC Placement Documentation  PICC Triple Lumen 06/23/23 Right Basilic 37 cm 1 cm (Active)  Indication for Insertion or Continuance of Line Vasoactive infusions;Administration of hyperosmolar/irritating solutions (i.e. TPN, Vancomycin, etc.) 06/23/23 1131  Exposed Catheter (cm) 1 cm 06/23/23 1131  Site Assessment Clean, Dry, Intact 06/23/23 1131  Lumen #1 Status Flushed;Saline locked;Blood return noted 06/23/23 1131  Lumen #2 Status Saline locked;Flushed;Blood return noted 06/23/23 1131  Lumen #3 Status Flushed;Saline locked;Blood return noted 06/23/23 1131  Dressing Type Transparent;Securing device 06/23/23 1131  Dressing Status Antimicrobial disc in place;Clean, Dry, Intact 06/23/23 1131  Line Care Connections checked and tightened 06/23/23 1131  Line Adjustment (NICU/IV Team Only) No 06/23/23 1131  Dressing Intervention New dressing 06/23/23 1131  Dressing Change Due 06/30/23 06/23/23 1131       Elliot Dally 06/23/2023, 11:31 AM

## 2023-06-23 NOTE — IPAL (Signed)
  Interdisciplinary Goals of Care Family Meeting   Date carried out: 06/23/2023  Location of the meeting: Bedside  Member's involved: Physician, Bedside Registered Nurse, and Family Member or next of kin      GOALS OF CARE DISCUSSION  The Clinical status was relayed to family in detail- Ex wife at Bedside  Updated and notified of patients medical condition-  Explained to family course of therapy and the modalities   Patient with Progressive multiorgan failure with a very high probablity of a very minimal chance of meaningful recovery despite all aggressive and optimal medical therapy.  PATIENT REMAINS FULL CODE  Family understands the situation. Severe DT's ABD pain Severe liver disease +bowel obstruction Severe PSYCH issues  Family are satisfied with Plan of action and management. All questions answered  Additional CC time 35 mins   [place] Santiago Glad, M.D.  [facility]  Medical Director [facility] Medical Director [facility] Cardio-Pulmonary Department

## 2023-06-23 NOTE — Progress Notes (Signed)
NAME:  Ryan Miranda, MRN:  [id number], DOB:  01/11/1976, LOS: 6 ADMISSION DATE:  06/15/2023, CONSULTATION DATE:  06/20/2023 REFERRING MD:  Dr. Renae Gloss, CHIEF COMPLAINT:  Severe Alcohol Withdrawal   Brief Pt Description / Synopsis:  47 y.o. male who was to be admitted to behavioral health for suicidal ideation.  While in ED boarding, course complicated by development of severe alcohol withdrawals and Delirium Tremens.  History of Present Illness:  Ryan Miranda is a 47 y.o. male with medical history significant for essential hypertension, depression, OSA on CPAP and alcohol abuse, presented to the emergency room with acute onset of suicidal ideation and on 11/29 which have been recurrent lately and got worse over the last 3 days.  He reported that he loaded his shotgun earlier during the day with a plan to kill himself but was stopped by son.    He was barding in the ED while waiting for behavioral health admission.  He was to be transferred to [facility], however he developed alcohol withdrawal with hallucination and delirium which required medical admission under [facility] service.  ED Course: Initial Vital Signs:  BP 171/131, tachycardia at 140,  Significant Labs: hyponatremia 129 and potassium of 3.1 and chloride 92 calcium 8.7 with total bili of 2.9. CBC showed leukocytosis of 13.2 compared to 15.6 at couple days ago. Tylenol level was less than 10 a couple days ago and salicylate level less than 7. Alcohol level then was 365  Imaging CT Head>>IMPRESSION: No acute intracranial abnormality. Medications Administered:  2L NS bolus, IM phenobarbital, Zyprexa, 2 mg IV Ativan x3 does, 5 mg Haldol, 10 mg Hydralazine, Valium 10 mg x2 doses, 5 mg Haldol   He was admitted by the Hospitalist service for further workup and treatment. Please see "Significant Hospital Events" for further workup and treatment.   Pertinent  Medical History   Past Medical  History:  Diagnosis Date   Depression    Elevated ferritin level    Elevated LFTs    Hypertension    Iron overload 02/06/2019   Laryngopharyngeal reflux (LPR)    OSA on CPAP    Tremors of nervous system    Unintentional weight loss     Micro Data:  12/1: HIV screen>>negative     Significant Hospital Events: Including procedures, antibiotic start and stop dates in addition to other pertinent events   11/29: Presented wit suicidal ideation.  Placed under IVC, To be admitted to behavioral health. 11/30: Was to be transferred to [facility] behavioral health hospital, however CIWA score too high in setting of developing alcohol withdrawal.  Hospitilaist asked to admit for medical management of withdrawals. 12/1: Vital stable, potassium with further decline to 2.7 which is being repleted, magnesium 2.5, improving leukocytosis at 11.3.  CIWA score of 4. 12/2: Vital stable, overnight became very agitated and combative and started on Precedex infusion.  Currently sedated. 12/3: Patient again became very agitated and combative in the evening, started on phenobarbital. 12/4: Remains on Precedex. Mental status fluctuates b/w sedated and agitation. PCCM consulted.  Abdomen distended, KUB concerning for possible SBO. 12/5: Nursing unsuccessful at placing NGT overnight x2, since reported multiple BM's overnight.  Remains on Precedex, weaning down.  Leukocytosis improving. 12/6 DT slowly improving, remains on precedex 12/7 worsening DT's, US shows Liver disease, CT adb +Ileus  Interim History / Subjective:  Severe DT's High risk for aspiration May need to consider PICC lines      Objective   Blood  pressure 95/62, pulse 74, temperature 99.6 F (37.6 C), temperature source Oral, resp. rate 12, height 5\' 10"  (1.778 m), weight 102.6 kg, SpO2 97%.        Intake/Output Summary (Last 24 hours) at 06/23/2023 0703 Last data filed at 06/23/2023 0700 Gross per 24 hour  Intake 1047.21 ml  Output 2050 ml   Net -1002.79 ml   Filed Weights   06/17/23 0929 06/22/23 0500 06/23/23 0500  Weight: 102.6 kg 103.8 kg 102.6 kg      REVIEW OF SYSTEMS  PATIENT IS UNABLE TO PROVIDE COMPLETE REVIEW OF SYSTEMS DUE TO SEVERE CRITICAL ILLNESS   PHYSICAL EXAMINATION:  GENERAL:critically ill appearing EYES: Pupils equal, round, reactive to light.  No scleral icterus.  MOUTH: Moist mucosal membrane. NECK: Supple.  PULMONARY: Lungs clear to auscultation, +rhonchi, CARDIOVASCULAR: S1 and S2.  Regular rate and rhythm GASTROINTESTINAL: Soft, nontender, +distended. NEG bowel sounds.  MUSCULOSKELETAL: edema.  NEUROLOGIC: obtunded,sedated SKIN:normal, warm to touch, Capillary refill delayed  Pulses present bilaterally   Assessment & Plan:   47 yo obese white male Admitted for Severe Alcohol Withdrawal with Delirium Tremens Suicidal Ideation complicated by severe liver disease and acute bowel obstruction/ileus PMHx: Depression   SEVERE ALCOHOL WITHDRAWAL -High risk for intubation  -high risk for aspiration -Therapy with Thiamine and MVI -CIWA Protocol -Precedex as needed -Promote normal sleep/wake cycle and family presence -Avoid sedating medications as able -Phenobarbital taper -scheduled Valium  -Continue Folic acid, MVI  ELECTROLYTES Hypokalemia Hyponatremia, likely secondary to beer potomania Hypomagnesemia Replace as needed  Abdominal Distention with concern for Small Bowel Obstruction/Ileus NGT to suction -Follow serial abdominal exams and serial KUB's   NEUROLOGY ACUTE METABOLIC ENCEPHALOPATHY  SEVERE DT's    Pt is critically ill with severe DT's and SBO.  Currently protecting his airway, but high risk for aspiration,  decompensation necessitating intubation, cardiac arrest, and death.  Prognosis is guarded.   Best Practice (right click and "Reselect all SmartList Selections" daily)   Diet/type: NPO DVT prophylaxis: LMWH GI prophylaxis: PPI Lines: N/A Foley:   N/A Code Status:  full code Last date of multidisciplinary goals of care discussion [12/5]  12/5:  Will update family when they arrive at bedside.  Labs   CBC: Recent Labs  Lab 06/18/23 1013 06/20/23 1216 06/21/23 0455 06/22/23 0358 06/23/23 0441  WBC 14.8* 14.8* 10.7* 10.4 11.4*  HGB 13.3 12.5* 12.2* 13.5 11.3*  HCT 38.8* 35.7* 35.5* 40.0 32.5*  MCV 87.2 85.6 87.0 87.1 86.2  PLT 204 193 190 203 277    Basic Metabolic Panel: Recent Labs  Lab 06/20/23 0433 06/20/23 2032 06/21/23 0455 06/21/23 0457 06/21/23 1946 06/22/23 0358 06/23/23 0441  NA 132* 131* 131*  --   --  132* 136  K 3.0* 3.3* 3.2*  --  3.4* 3.7 2.9*  CL 96* 93* 95*  --   --  95* 100  CO2 26 24 23   --   --  24 28  GLUCOSE 105* 113* 112*  --   --  104* 120*  BUN 10 11 11   --   --  10 8  CREATININE 0.70 0.70 0.72  --   --  0.78 0.76  CALCIUM 8.6* 8.5* 8.3*  --   --  8.4* 8.0*  MG 1.6* 1.9  --  1.6* 2.5* 2.6* 1.9  PHOS 1.1* 4.0 3.5  --  3.4 2.7 2.0*   GFR: Estimated Creatinine Clearance: 134 mL/min (by C-G formula based on SCr of 0.76 mg/dL). Recent Labs  Lab 06/20/23 1216 06/21/23 0455 06/22/23 0358 06/23/23 0441  PROCALCITON 0.18  --   --   --   WBC 14.8* 10.7* 10.4 11.4*    Liver Function Tests: Recent Labs  Lab 06/17/23 0008 06/20/23 0433 06/21/23 0455 06/22/23 0358 06/23/23 0441  AST 63* 46* 50* 37  --   ALT 33 31 30 26   --   ALKPHOS 92 81 72 76  --   BILITOT 2.9* 1.2* 1.5* 1.2*  --   PROT 7.2 6.3* 6.4* 6.8  --   ALBUMIN 4.2 3.2* 3.3*  3.3* 3.3*  3.4* 3.0*   Recent Labs  Lab 06/22/23 0358  LIPASE 35   Recent Labs  Lab 06/17/23 0008 06/21/23 0455  AMMONIA 57* 35      DVT/GI PRX  assessed I Assessed the need for Labs I Assessed the need for Foley I Assessed the need for Central Venous Line Family Discussion when available I Assessed the need for Mobilization I made an Assessment of medications to be adjusted accordingly Safety Risk assessment completed  CASE  DISCUSSED IN MULTIDISCIPLINARY ROUNDS WITH ICU TEAM     Critical Care Time devoted to patient care services described in this note is 55 minutes.  Critical care was necessary to treat /prevent imminent and life-threatening deterioration. Overall, patient is critically ill, prognosis is guarded.  Patient with Multiorgan failure and at high risk for cardiac arrest and death.    Lucie Leather, M.D.  [facility]  Medical Director [facility] Medical Director [facility] Cardio-Pulmonary Department

## 2023-06-23 NOTE — Plan of Care (Signed)
  Problem: Health Behavior/Discharge Planning: Goal: Ability to manage health-related needs will improve Outcome: Not Progressing   Problem: Nutrition: Goal: Adequate nutrition will be maintained Outcome: Not Progressing   

## 2023-06-23 NOTE — Progress Notes (Signed)
PHARMACY CONSULT NOTE - FOLLOW UP  Pharmacy Consult for Electrolyte Monitoring and Replacement   Recent Labs: Potassium (mmol/L)  Date Value  06/23/2023 2.9 (L)   Magnesium (mg/dL)  Date Value  91/47/8295 1.9   Calcium (mg/dL)  Date Value  62/13/0865 8.0 (L)   Albumin (g/dL)  Date Value  78/46/9629 3.0 (L)   Phosphorus (mg/dL)  Date Value  52/84/1324 2.0 (L)   Sodium (mmol/L)  Date Value  06/23/2023 136     Assessment: 47 y/o male with h/o depression, SI, GERD, OSA, etoh abuse, cirrhosis, HTN, breast mass s/p mastectomy (90s), asthma and tremors who is admitted from [facility] with severe alcohol withdrawal with delirium tremens and abdominal distension. Pharmacy is asked to follow and replace electrolytes  Goal of Therapy:  Electrolytes WNL  Plan:  ---30 mmol IV potassium phosphate x 1 (contains 44 mEq IV potassium) ---recheck electrolytes in am  Lowella Bandy ,PharmD Clinical Pharmacist 06/23/2023 7:02 AM

## 2023-06-24 ENCOUNTER — Inpatient Hospital Stay: Payer: Managed Care, Other (non HMO)

## 2023-06-24 DIAGNOSIS — F10931 Alcohol use, unspecified with withdrawal delirium: Secondary | ICD-10-CM | POA: Diagnosis not present

## 2023-06-24 LAB — PROCALCITONIN: Procalcitonin: 0.43 ng/mL

## 2023-06-24 LAB — RENAL FUNCTION PANEL
Albumin: 2.9 g/dL — ABNORMAL LOW (ref 3.5–5.0)
Anion gap: 13 (ref 5–15)
BUN: 7 mg/dL (ref 6–20)
CO2: 25 mmol/L (ref 22–32)
Calcium: 8.2 mg/dL — ABNORMAL LOW (ref 8.9–10.3)
Chloride: 96 mmol/L — ABNORMAL LOW (ref 98–111)
Creatinine, Ser: 0.83 mg/dL (ref 0.61–1.24)
GFR, Estimated: >60 mL/min (ref >60)
Glucose, Bld: 92 mg/dL (ref 70–99)
Phosphorus: 2.7 mg/dL (ref 2.5–4.6)
Potassium: 3.8 mmol/L (ref 3.5–5.1)
Sodium: 134 mmol/L — ABNORMAL LOW (ref 135–145)

## 2023-06-24 LAB — URINALYSIS, COMPLETE (UACMP) WITH MICROSCOPIC
Bacteria, UA: NONE SEEN
Glucose, UA: NEGATIVE mg/dL
Ketones, ur: 15 mg/dL — AB
Leukocytes,Ua: NEGATIVE
Nitrite: NEGATIVE
Protein, ur: 30 mg/dL — AB
Specific Gravity, Urine: >1.03 — ABNORMAL HIGH (ref 1.005–1.030)
pH: 5.5 (ref 5.0–8.0)

## 2023-06-24 LAB — CBC
HCT: 34.7 % — ABNORMAL LOW (ref 39.0–52.0)
Hemoglobin: 11.8 g/dL — ABNORMAL LOW (ref 13.0–17.0)
MCH: 29.7 pg (ref 26.0–34.0)
MCHC: 34 g/dL (ref 30.0–36.0)
MCV: 87.4 fL (ref 80.0–100.0)
Platelets: 350 10*3/uL (ref 150–400)
RBC: 3.97 MIL/uL — ABNORMAL LOW (ref 4.22–5.81)
RDW: 14.1 % (ref 11.5–15.5)
WBC: 14.6 10*3/uL — ABNORMAL HIGH (ref 4.0–10.5)
nRBC: 0 % (ref 0.0–0.2)

## 2023-06-24 LAB — MAGNESIUM: Magnesium: 2.4 mg/dL (ref 1.7–2.4)

## 2023-06-24 MED ORDER — PIPERACILLIN-TAZOBACTAM 3.375 G IVPB
3.3750 g | Freq: Three times a day (TID) | INTRAVENOUS | Status: DC
  Administered 2023-06-24 – 2023-06-27 (×11): 3.375 g via INTRAVENOUS
  Filled 2023-06-24 (×11): qty 50

## 2023-06-24 MED ORDER — TRACE MINERALS CU-MN-SE-ZN 300-55-60-3000 MCG/ML IV SOLN
INTRAVENOUS | Status: DC
  Filled 2023-06-24: qty 100

## 2023-06-24 MED ORDER — INSULIN ASPART 100 UNIT/ML IJ SOLN: 0.0000 [IU] | Freq: Four times a day (QID) | INTRAMUSCULAR | Status: DC

## 2023-06-24 MED ORDER — TRACE MINERALS CU-MN-SE-ZN 300-55-60-3000 MCG/ML IV SOLN
INTRAVENOUS | Status: AC
  Filled 2023-06-24: qty 1000

## 2023-06-24 MED ORDER — ACETAMINOPHEN 10 MG/ML IV SOLN
1000.0000 mg | Freq: Once | INTRAVENOUS | Status: AC
  Administered 2023-06-24: 1000 mg via INTRAVENOUS
  Filled 2023-06-24: qty 100

## 2023-06-24 NOTE — Plan of Care (Signed)
  Problem: Education: Goal: Knowledge of General Education information will improve Description: Including pain rating scale, medication(s)/side effects and non-pharmacologic comfort measures Outcome: Progressing   Problem: Health Behavior/Discharge Planning: Goal: Ability to manage health-related needs will improve Outcome: Progressing   Problem: Clinical Measurements: Goal: Ability to maintain clinical measurements within normal limits will improve Outcome: Progressing Goal: Will remain free from infection Outcome: Progressing Goal: Diagnostic test results will improve Outcome: Progressing Goal: Respiratory complications will improve Outcome: Progressing Goal: Cardiovascular complication will be avoided Outcome: Progressing   Problem: Activity: Goal: Risk for activity intolerance will decrease Outcome: Progressing   Problem: Nutrition: Goal: Adequate nutrition will be maintained Outcome: Progressing   Problem: Coping: Goal: Level of anxiety will decrease Outcome: Progressing   Problem: Elimination: Goal: Will not experience complications related to bowel motility Outcome: Not Progressing Goal: Will not experience complications related to urinary retention Outcome: Progressing   Problem: Pain Management: Goal: General experience of comfort will improve Outcome: Progressing   Problem: Safety: Goal: Ability to remain free from injury will improve Outcome: Progressing   Problem: Skin Integrity: Goal: Risk for impaired skin integrity will decrease Outcome: Progressing

## 2023-06-24 NOTE — Progress Notes (Signed)
NAME:  Ryan Miranda, MRN:  [id number], DOB:  1975-08-06, LOS: 7 ADMISSION DATE:  06/15/2023, CONSULTATION DATE:  06/20/2023 REFERRING MD:  Dr. Renae Gloss, CHIEF COMPLAINT:  Severe Alcohol Withdrawal   Brief Pt Description / Synopsis:  47 y.o. male who was to be admitted to behavioral health for suicidal ideation.  While in ED boarding, course complicated by development of severe alcohol withdrawals and Delirium Tremens.  History of Present Illness:  Ryan Miranda is a 47 y.o. male with medical history significant for essential hypertension, depression, OSA on CPAP and alcohol abuse, presented to the emergency room with acute onset of suicidal ideation and on 11/29 which have been recurrent lately and got worse over the last 3 days.  He reported that he loaded his shotgun earlier during the day with a plan to kill himself but was stopped by son.    He was barding in the ED while waiting for behavioral health admission.  He was to be transferred to [facility], however he developed alcohol withdrawal with hallucination and delirium which required medical admission under [facility] service.  ED Course: Initial Vital Signs:  BP 171/131, tachycardia at 140,  Significant Labs: hyponatremia 129 and potassium of 3.1 and chloride 92 calcium 8.7 with total bili of 2.9. CBC showed leukocytosis of 13.2 compared to 15.6 at couple days ago. Tylenol level was less than 10 a couple days ago and salicylate level less than 7. Alcohol level then was 365  Imaging CT Head>>IMPRESSION: No acute intracranial abnormality. Medications Administered:  2L NS bolus, IM phenobarbital, Zyprexa, 2 mg IV Ativan x3 does, 5 mg Haldol, 10 mg Hydralazine, Valium 10 mg x2 doses, 5 mg Haldol   He was admitted by the Hospitalist service for further workup and treatment. Please see "Significant Hospital Events" for further workup and treatment.   Pertinent  Medical History   Past Medical  History:  Diagnosis Date   Depression    Elevated ferritin level    Elevated LFTs    Hypertension    Iron overload 02/06/2019   Laryngopharyngeal reflux (LPR)    OSA on CPAP    Tremors of nervous system    Unintentional weight loss     Micro Data:  12/1: HIV screen>>negative     Significant Hospital Events: Including procedures, antibiotic start and stop dates in addition to other pertinent events   11/29: Presented wit suicidal ideation.  Placed under IVC, To be admitted to behavioral health. 11/30: Was to be transferred to [facility] behavioral health hospital, however CIWA score too high in setting of developing alcohol withdrawal.  Hospitilaist asked to admit for medical management of withdrawals. 12/1: Vital stable, potassium with further decline to 2.7 which is being repleted, magnesium 2.5, improving leukocytosis at 11.3.  CIWA score of 4. 12/2: Vital stable, overnight became very agitated and combative and started on Precedex infusion.  Currently sedated. 12/3: Patient again became very agitated and combative in the evening, started on phenobarbital. 12/4: Remains on Precedex. Mental status fluctuates b/w sedated and agitation. PCCM consulted.  Abdomen distended, KUB concerning for possible SBO. 12/5: Nursing unsuccessful at placing NGT overnight x2, since reported multiple BM's overnight.  Remains on Precedex, weaning down.  Leukocytosis improving. 12/6 DT slowly improving, remains on precedex 12/7 worsening DT's, US shows Liver disease, CT adb +Ileus  Interim History / Subjective:  Severe DT's High risk for aspiration +PICC LINE PLACED +fevers +cough Therapy for aspiration pneumonia Patient is thirsty   Objective  Blood pressure 104/74, pulse (!) 110, temperature 99.8 F (37.7 C), temperature source Axillary, resp. rate 16, height 5\' 10"  (1.778 m), weight 99.7 kg, SpO2 95%.        Intake/Output Summary (Last 24 hours) at 06/24/2023 0756 Last data filed at 06/24/2023  0600 Gross per 24 hour  Intake 1456.76 ml  Output 1300 ml  Net 156.76 ml   Filed Weights   06/22/23 0500 06/23/23 0500 06/24/23 0600  Weight: 103.8 kg 102.6 kg 99.7 kg       REVIEW OF SYSTEMS More awake this AM  PHYSICAL EXAMINATION:  GENERAL:critically ill appearing EYES: Pupils equal, round, reactive to light.  No scleral icterus.  MOUTH: Moist mucosal membrane.  NECK: Supple.  PULMONARY: Lungs clear to auscultation, +rhonchi CARDIOVASCULAR: S1 and S2.  Regular rate and rhythm GASTROINTESTINAL: Soft, nontender, -distended. Positive bowel sounds.  MUSCULOSKELETAL: edema.  NEUROLOGIC: more alert and awake SKIN:normal, warm to touch, Capillary refill delayed  Pulses present bilaterally   Assessment & Plan:   47 yo obese white male Admitted for Severe Alcohol Withdrawal with Delirium Tremens Suicidal Ideation complicated by severe liver disease and acute bowel obstruction/ileus PMHx: Depression  SEVERE ALCOHOL WITHDRAWAL -High risk for intubation  -high risk for aspiration -Therapy with Thiamine and MVI -CIWA Protocol -Precedex as needed -Promote normal sleep/wake cycle and family presence -Avoid sedating medications as able -Phenobarbital taper -scheduled Valium  -Continue Folic acid, MVI  ELECTROLYTES -follow labs as needed -replace as needed -pharmacy consultation and following Hypokalemia Hyponatremia, likely secondary to beer potomania Hypomagnesemia   Abdominal Distention with concern for Small Bowel Obstruction/Ileus NGT to suction -Follow serial abdominal exams and serial KUB's Try clear liquids with water today   NEUROLOGY ACUTE METABOLIC ENCEPHALOPATHY  SEVERE DT's Seems to be slowly improving     Best Practice (right click and "Reselect all SmartList Selections" daily)   Diet/type: NPO DVT prophylaxis: LMWH GI prophylaxis: PPI Lines: N/A Foley:  N/A Code Status:  full code Last date of multidisciplinary goals of care discussion  [12/5]  12/5:  Will update family when they arrive at bedside.  Labs   CBC: Recent Labs  Lab 06/20/23 1216 06/21/23 0455 06/22/23 0358 06/23/23 0441 06/24/23 0538  WBC 14.8* 10.7* 10.4 11.4* 14.6*  HGB 12.5* 12.2* 13.5 11.3* 11.8*  HCT 35.7* 35.5* 40.0 32.5* 34.7*  MCV 85.6 87.0 87.1 86.2 87.4  PLT 193 190 203 277 350    Basic Metabolic Panel: Recent Labs  Lab 06/21/23 0455 06/21/23 0457 06/21/23 1946 06/22/23 0358 06/23/23 0441 06/23/23 2127 06/24/23 0532 06/24/23 0538  NA 131*  --   --  132* 136 136 134*  --   K 3.2*  --  3.4* 3.7 2.9* 3.1* 3.8  --   CL 95*  --   --  95* 100 97* 96*  --   CO2 23  --   --  24 28 28 25   --   GLUCOSE 112*  --   --  104* 120* 104* 92  --   BUN 11  --   --  10 8 8 7   --   CREATININE 0.72  --   --  0.78 0.76 0.76 0.83  --   CALCIUM 8.3*  --   --  8.4* 8.0* 8.4* 8.2*  --   MG  --    < > 2.5* 2.6* 1.9 1.9  --  2.4  PHOS 3.5  --  3.4 2.7 2.0* 3.4 2.7  --    < > =  values in this interval not displayed.   GFR: Estimated Creatinine Clearance: 127.5 mL/min (by C-G formula based on SCr of 0.83 mg/dL). Recent Labs  Lab 06/20/23 1216 06/21/23 0455 06/22/23 0358 06/23/23 0441 06/24/23 0538  PROCALCITON 0.18  --   --   --   --   WBC 14.8* 10.7* 10.4 11.4* 14.6*    Liver Function Tests: Recent Labs  Lab 06/20/23 0433 06/21/23 0455 06/22/23 0358 06/23/23 0441 06/24/23 0532  AST 46* 50* 37  --   --   ALT 31 30 26   --   --   ALKPHOS 81 72 76  --   --   BILITOT 1.2* 1.5* 1.2*  --   --   PROT 6.3* 6.4* 6.8  --   --   ALBUMIN 3.2* 3.3*  3.3* 3.3*  3.4* 3.0* 2.9*   Recent Labs  Lab 06/22/23 0358  LIPASE 35   Recent Labs  Lab 06/21/23 0455  AMMONIA 35       DVT/GI PRX  assessed I Assessed the need for Labs I Assessed the need for Foley I Assessed the need for Central Venous Line Family Discussion when available I Assessed the need for Mobilization I made an Assessment of medications to be adjusted  accordingly Safety Risk assessment completed  CASE DISCUSSED IN MULTIDISCIPLINARY ROUNDS WITH ICU TEAM     Critical Care Time devoted to patient care services described in this note is 55 minutes.  Critical care was necessary to treat /prevent imminent and life-threatening deterioration.    Lucie Leather, M.D.  [facility]  Medical Director [facility] Medical Director [facility] Cardio-Pulmonary Department

## 2023-06-24 NOTE — Evaluation (Signed)
Physical Therapy Evaluation Patient Details Name: Ryan Miranda MRN: [id number] DOB: [birth year] Today's Date: 06/24/2023  History of Present Illness  47 y.o. male who was to be admitted to behavioral health for suicidal ideation.  While in ED boarding, course complicated by development of severe alcohol withdrawals and Delirium Tremens.  Clinical Impression  Pt awake and willing to work with PT.  NG tube and O2 in place, resting HR in the 120s. Pt showed good effort and willingness to participate but had elevated HR to ~140 with minimal bed mobility tasks and up to ~150 with standing activity. He was able to manage a few small marching steps and then shuffle/turn to get to recliner but HR remained 140s t/o the effort and even ~5 minutes after sitting remained >140, RN notified and monitoring.  Pt does endorse feeling very weak, but feels that he will be able to do more as he continues to improve medically.  Pt will benefit from PT to address functional limitations.        If plan is discharge home, recommend the following: Assist for transportation;Help with stairs or ramp for entrance;Assistance with cooking/housework;A little help with walking and/or transfers;A little help with bathing/dressing/bathroom   Can travel by private vehicle        Equipment Recommendations Rolling walker (2 wheels) (per progress)  Recommendations for Other Services       Functional Status Assessment Patient has had a recent decline in their functional status and demonstrates the ability to make significant improvements in function in a reasonable and predictable amount of time.     Precautions / Restrictions Precautions Precautions: Fall Restrictions Weight Bearing Restrictions: No      Mobility  Bed Mobility Overal bed mobility: Modified Independent             General bed mobility comments: assist only for line/lead management    Transfers Overall transfer level: Needs  assistance Equipment used: Rolling walker (2 wheels) Transfers: Sit to/from Stand Sit to Stand: Contact guard assist           General transfer comment: Pt with heavy UE use and slow to rise effort, but did not need direct assist to attain standing    Ambulation/Gait               General Gait Details: deferred ambulation as HR rose to the 140s with minimal activity, did a few turning shuffle steps to recliner with HR getting to nearly 150.  Stairs            Wheelchair Mobility     Tilt Bed    Modified Rankin (Stroke Patients Only)       Balance Overall balance assessment: Needs assistance Sitting-balance support: Bilateral upper extremity supported Sitting balance-Leahy Scale: Normal     Standing balance support: Bilateral upper extremity supported Standing balance-Leahy Scale: Fair Standing balance comment: no overt knee buckling, but clearly weak and needing to rely on RW to maintain balance                             Pertinent Vitals/Pain Pain Assessment Pain Assessment: 0-10 Pain Score: 7  Pain Location: reports chronic pain in ribs from coughing, liver from drinking and R shoulder from a lot of sports    Home Living Family/patient expects to be discharged to:: Private residence Living Arrangements: Alone Available Help at Discharge:  (minimal assist available) Type of Home: Apartment Home Access: Stairs  to enter Entrance Stairs-Rails:  (yes) Entrance Stairs-Number of Steps: flight     Home Equipment: None      Prior Function Prior Level of Function : Independent/Modified Independent;Working/employed;Driving             Mobility Comments: job does include prolonged standing/walking sometimes ADLs Comments: independent     Extremity/Trunk Assessment   Upper Extremity Assessment Upper Extremity Assessment: Generalized weakness;Overall [facility] for tasks assessed    Lower Extremity Assessment Lower Extremity Assessment:  Generalized weakness;Overall WFL for tasks assessed       Communication   Communication Communication: No apparent difficulties  Cognition Arousal: Alert Behavior During Therapy: WFL for tasks assessed/performed Overall Cognitive Status: Within Functional Limits for tasks assessed                                          General Comments      Exercises     Assessment/Plan    PT Assessment Patient needs continued PT services  PT Problem List Decreased strength;Decreased activity tolerance;Decreased balance;Decreased safety awareness;Decreased knowledge of use of DME;Cardiopulmonary status limiting activity;Pain       PT Treatment Interventions DME instruction;Gait training;Stair training;Functional mobility training;Therapeutic activities;Therapeutic exercise;Balance training;Patient/family education    PT Goals (Current goals can be found in the Care Plan section)  Acute Rehab PT Goals Patient Stated Goal: go home PT Goal Formulation: With patient Time For Goal Achievement: 07/07/23 Potential to Achieve Goals: Good    Frequency Min 1X/week     Co-evaluation               AM-PAC PT "6 Clicks" Mobility  Outcome Measure Help needed turning from your back to your side while in a flat bed without using bedrails?: None Help needed moving from lying on your back to sitting on the side of a flat bed without using bedrails?: None Help needed moving to and from a bed to a chair (including a wheelchair)?: A Little Help needed standing up from a chair using your arms (e.g., wheelchair or bedside chair)?: A Little Help needed to walk in hospital room?: A Lot Help needed climbing 3-5 steps with a railing? : A Lot 6 Click Score: 18    End of Session Equipment Utilized During Treatment: Gait belt;Oxygen (2L) Activity Tolerance: Patient limited by fatigue Patient left: in chair;with call bell/phone within reach;with nursing/sitter in room Nurse  Communication: Mobility status (HR 140s t/o activity, and at rest) PT Visit Diagnosis: Muscle weakness (generalized) (M62.81);Difficulty in walking, not elsewhere classified (R26.2);Pain Pain - Right/Left: Right Pain - part of body: Shoulder    Time: 4098-1191 PT Time Calculation (min) (ACUTE ONLY): 28 min   Charges:   PT Evaluation $PT Eval Low Complexity: 1 Low PT Treatments $Therapeutic Activity: 8-22 mins PT General Charges $$ ACUTE PT VISIT: 1 Visit         Malachi Pro, DPT 06/24/2023, 4:46 PM

## 2023-06-24 NOTE — Plan of Care (Signed)

## 2023-06-24 NOTE — Progress Notes (Signed)
PHARMACY NOTE:  ANTIMICROBIAL RENAL DOSAGE ADJUSTMENT  Current antimicrobial regimen includes a mismatch between antimicrobial dosage and estimated renal function.  As per policy approved by the Pharmacy & Therapeutics and Medical Executive Committees, the antimicrobial dosage will be adjusted accordingly.  Current antimicrobial dosage:  Zosyn 4.5 gm IV Q6H   Indication:   Renal Function:  Estimated Creatinine Clearance: 134 mL/min (by C-G formula based on SCr of 0.76 mg/dL). []      On intermittent HD, scheduled: []      On CRRT    Antimicrobial dosage has been changed to:  Zosyn 3.375 gm IV Q8H EI   Additional comments:   Thank you for allowing pharmacy to be a part of this patient's care.  Myrle Wanek D, [facility] 06/24/2023 5:05 AM

## 2023-06-24 NOTE — Progress Notes (Signed)
Patient A/O x4. Pleasant, with intermittent confusion. Stable pressures, on 2L [place].Precedex weaned down, patient tolerating. Patient HR 120s. Patient HR 140s with exertion, slow to decrease, sustaining after PT in 130s. MD aware. Patient in chair. Patient complaining of discomfort at left nare with NG. Tape exchanged for bridle, Tolerated well.  Spouse at bedside, updated.

## 2023-06-24 NOTE — Plan of Care (Signed)
  Problem: Clinical Measurements: Goal: Ability to maintain clinical measurements within normal limits will improve Outcome: Progressing Goal: Will remain free from infection Outcome: Progressing Goal: Diagnostic test results will improve Outcome: Progressing Goal: Respiratory complications will improve Outcome: Progressing Goal: Cardiovascular complication will be avoided Outcome: Progressing   Problem: Activity: Goal: Risk for activity intolerance will decrease Outcome: Progressing   Problem: Elimination: Goal: Will not experience complications related to bowel motility Outcome: Progressing Goal: Will not experience complications related to urinary retention Outcome: Progressing   Problem: Pain Management: Goal: General experience of comfort will improve Outcome: Progressing   Problem: Safety: Goal: Ability to remain free from injury will improve Outcome: Progressing   Problem: Skin Integrity: Goal: Risk for impaired skin integrity will decrease Outcome: Progressing   Problem: Education: Goal: Knowledge of General Education information will improve Description: Including pain rating scale, medication(s)/side effects and non-pharmacologic comfort measures Outcome: Not Progressing   Problem: Health Behavior/Discharge Planning: Goal: Ability to manage health-related needs will improve Outcome: Not Progressing   Problem: Nutrition: Goal: Adequate nutrition will be maintained Outcome: Not Progressing   Problem: Coping: Goal: Level of anxiety will decrease Outcome: Not Progressing

## 2023-06-24 NOTE — Progress Notes (Signed)
PHARMACY - TOTAL PARENTERAL NUTRITION CONSULT NOTE   Indication: without adequate nutrition   Patient Measurements: Height: 5\' 10"  (177.8 cm) Weight: 99.7 kg (219 lb 12.8 oz) IBW/kg (Calculated) : 73   Body mass index is 31.54 kg/m.  Assessment:  47 y/o male with h/o depression, SI, GERD, OSA, etoh abuse, cirrhosis, HTN, breast mass s/p mastectomy (90s), asthma and tremors who is admitted from [facility] with severe alcohol withdrawal with delirium tremens and abdominal distension.   Glucose / Insulin: not requiring SSI Electrolytes: mild hyponatremia Renal: SCr<1, stable Hepatic: LFTs wnl, bilirubin elevated at baseline Intake / Output net (+) 6L GI Imaging: 12/06 CT Abd Dilated loops of small bowel and colon, favoring adynamic ileus over small bowel obstruction. GI Surgeries / Procedures: none recent  Central access: 06/23/23 TPN start date: 06/24/23  RD Assessment: Estimated Needs Total Energy Estimated Needs: 2100-2400kcal/day Total Protein Estimated Needs: 105-120g/day Total Fluid Estimated Needs: 2.3-2.6L/day  Current Nutrition:  Clear liquids  Plan:  ---Start E8/10 TPN at 57mL/hr at 1800 ---Electrolytes in TPN: Na 78mEq/L, K 35mEq/L, Ca 4.45mEq/L, Mg 31mEq/L, and Phos 75mmol/L. Cl:Ac 0.49 ---Add standard MVI and trace elements to TPN ---Initiate Sensitive q6h SSI and adjust as needed  ---Monitor TPN labs on Mon/Thurs, daily until stable  Lowella Bandy 06/24/2023,7:21 AM

## 2023-06-25 DIAGNOSIS — R5081 Fever presenting with conditions classified elsewhere: Secondary | ICD-10-CM

## 2023-06-25 DIAGNOSIS — R45851 Suicidal ideations: Secondary | ICD-10-CM | POA: Diagnosis not present

## 2023-06-25 DIAGNOSIS — F10931 Alcohol use, unspecified with withdrawal delirium: Secondary | ICD-10-CM | POA: Diagnosis not present

## 2023-06-25 DIAGNOSIS — E876 Hypokalemia: Secondary | ICD-10-CM | POA: Diagnosis not present

## 2023-06-25 LAB — RENAL FUNCTION PANEL
Albumin: 3.2 g/dL — ABNORMAL LOW (ref 3.5–5.0)
Anion gap: 12 (ref 5–15)
BUN: 6 mg/dL (ref 6–20)
CO2: 28 mmol/L (ref 22–32)
Calcium: 8.5 mg/dL — ABNORMAL LOW (ref 8.9–10.3)
Chloride: 95 mmol/L — ABNORMAL LOW (ref 98–111)
Creatinine, Ser: 0.72 mg/dL (ref 0.61–1.24)
GFR, Estimated: >60 mL/min
Glucose, Bld: 111 mg/dL — ABNORMAL HIGH (ref 70–99)
Phosphorus: 3.1 mg/dL (ref 2.5–4.6)
Potassium: 2.8 mmol/L — ABNORMAL LOW (ref 3.5–5.1)
Sodium: 135 mmol/L (ref 135–145)

## 2023-06-25 LAB — CBC
HCT: 37.2 % — ABNORMAL LOW (ref 39.0–52.0)
Hemoglobin: 12.7 g/dL — ABNORMAL LOW (ref 13.0–17.0)
MCH: 29.7 pg (ref 26.0–34.0)
MCHC: 34.1 g/dL (ref 30.0–36.0)
MCV: 87.1 fL (ref 80.0–100.0)
Platelets: 413 10*3/uL — ABNORMAL HIGH (ref 150–400)
RBC: 4.27 MIL/uL (ref 4.22–5.81)
RDW: 14.1 % (ref 11.5–15.5)
WBC: 16.5 10*3/uL — ABNORMAL HIGH (ref 4.0–10.5)
nRBC: 0 % (ref 0.0–0.2)

## 2023-06-25 LAB — GASTROINTESTINAL PANEL BY PCR, STOOL (REPLACES STOOL CULTURE)

## 2023-06-25 LAB — GLUCOSE, CAPILLARY
Glucose-Capillary: 106 mg/dL — ABNORMAL HIGH (ref 70–99)
Glucose-Capillary: 135 mg/dL — ABNORMAL HIGH (ref 70–99)
Glucose-Capillary: 95 mg/dL (ref 70–99)
Glucose-Capillary: 98 mg/dL (ref 70–99)
Glucose-Capillary: 98 mg/dL (ref 70–99)

## 2023-06-25 LAB — POTASSIUM: Potassium: 3.3 mmol/L — ABNORMAL LOW (ref 3.5–5.1)

## 2023-06-25 LAB — TRIGLYCERIDES: Triglycerides: 119 mg/dL

## 2023-06-25 LAB — MAGNESIUM
Magnesium: 2.1 mg/dL (ref 1.7–2.4)
Magnesium: 2 mg/dL (ref 1.7–2.4)

## 2023-06-25 LAB — CALCIUM, IONIZED: Calcium, Ionized, Serum: 5 mg/dL (ref 4.5–5.6)

## 2023-06-25 MED ORDER — POTASSIUM CHLORIDE 20 MEQ PO PACK
40.0000 meq | PACK | Freq: Once | ORAL | Status: AC
  Administered 2023-06-25: 40 meq
  Filled 2023-06-25: qty 2

## 2023-06-25 MED ORDER — MIDAZOLAM HCL 2 MG/2ML IJ SOLN
2.0000 mg | Freq: Once | INTRAMUSCULAR | Status: AC
  Administered 2023-06-25: 2 mg via INTRAVENOUS

## 2023-06-25 MED ORDER — SERTRALINE HCL 50 MG PO TABS
50.0000 mg | ORAL_TABLET | Freq: Every day | ORAL | Status: DC
  Administered 2023-06-26 – 2023-06-27 (×2): 50 mg
  Filled 2023-06-25 (×2): qty 1

## 2023-06-25 MED ORDER — MIDAZOLAM HCL 2 MG/2ML IJ SOLN
INTRAMUSCULAR | Status: AC
  Filled 2023-06-25: qty 2

## 2023-06-25 MED ORDER — CHLORHEXIDINE GLUCONATE CLOTH 2 % EX PADS
6.0000 | MEDICATED_PAD | Freq: Every day | CUTANEOUS | Status: DC
  Administered 2023-06-27 – 2023-06-28 (×3): 6 via TOPICAL

## 2023-06-25 MED ORDER — POTASSIUM CHLORIDE 10 MEQ/50ML IV SOLN
10.0000 meq | INTRAVENOUS | Status: AC
  Administered 2023-06-25 (×6): 10 meq via INTRAVENOUS
  Filled 2023-06-25 (×7): qty 50

## 2023-06-25 MED ORDER — OLANZAPINE 10 MG IM SOLR
10.0000 mg | Freq: Once | INTRAMUSCULAR | Status: DC
  Filled 2023-06-25: qty 10

## 2023-06-25 MED ORDER — QUETIAPINE FUMARATE 25 MG PO TABS: 50.0000 mg | ORAL_TABLET | Freq: Two times a day (BID) | ORAL | Status: DC

## 2023-06-25 MED ORDER — POTASSIUM CHLORIDE 10 MEQ/100ML IV SOLN
10.0000 meq | INTRAVENOUS | Status: AC
  Administered 2023-06-25 (×2): 10 meq via INTRAVENOUS
  Filled 2023-06-25 (×2): qty 100

## 2023-06-25 MED ORDER — NOREPINEPHRINE 4 MG/250ML-% IV SOLN
INTRAVENOUS | Status: AC
  Administered 2023-06-25: 4 mg
  Filled 2023-06-25: qty 250

## 2023-06-25 MED ORDER — POTASSIUM CHLORIDE 10 MEQ/50ML IV SOLN
10.0000 meq | INTRAVENOUS | Status: DC
  Filled 2023-06-25 (×2): qty 50

## 2023-06-25 MED ORDER — QUETIAPINE FUMARATE 25 MG PO TABS
50.0000 mg | ORAL_TABLET | Freq: Once | ORAL | Status: AC
  Administered 2023-06-25: 50 mg
  Filled 2023-06-25: qty 2

## 2023-06-25 MED ORDER — POTASSIUM CHLORIDE 10 MEQ/50ML IV SOLN
10.0000 meq | Freq: Once | INTRAVENOUS | Status: DC
  Filled 2023-06-25: qty 50

## 2023-06-25 MED ORDER — TRAZODONE HCL 50 MG PO TABS
25.0000 mg | ORAL_TABLET | Freq: Every evening | ORAL | Status: DC | PRN
  Administered 2023-06-26: 25 mg
  Filled 2023-06-25: qty 1

## 2023-06-25 MED ORDER — INSULIN ASPART 100 UNIT/ML IJ SOLN
0.0000 [IU] | Freq: Three times a day (TID) | INTRAMUSCULAR | Status: DC
  Administered 2023-06-25: 1 [IU] via SUBCUTANEOUS
  Filled 2023-06-25: qty 1

## 2023-06-25 MED ORDER — SODIUM CHLORIDE 0.9 % IV BOLUS
500.0000 mL | Freq: Once | INTRAVENOUS | Status: AC
  Administered 2023-06-25: 500 mL via INTRAVENOUS

## 2023-06-25 MED ORDER — QUETIAPINE FUMARATE 25 MG PO TABS
50.0000 mg | ORAL_TABLET | Freq: Two times a day (BID) | ORAL | Status: DC
  Administered 2023-06-25 – 2023-06-27 (×4): 50 mg
  Filled 2023-06-25 (×4): qty 2

## 2023-06-25 MED ORDER — INFUVITE ADULT IV SOLN
INTRAVENOUS | Status: AC
  Filled 2023-06-25: qty 1000

## 2023-06-25 MED ORDER — FAT EMUL FISH OIL/PLANT BASED 20% (SMOFLIPID)IV EMUL
250.0000 mL | INTRAVENOUS | Status: AC
  Administered 2023-06-25 (×2): 250 mL via INTRAVENOUS
  Filled 2023-06-25: qty 250

## 2023-06-25 MED ORDER — NOREPINEPHRINE 16 MG/250ML-% IV SOLN
0.0000 ug/min | INTRAVENOUS | Status: DC
  Administered 2023-06-25: 5 ug/min via INTRAVENOUS
  Filled 2023-06-25: qty 250

## 2023-06-25 NOTE — Progress Notes (Signed)
Patient very agitated, threatening to "hit" staff members. Multiple PRNs given, re-orientation attempted by multiple staff members and security. Restarted Precedex and gave Versed PRN to help maintain patient safely. Sitter still at bedside. Will continue to monitor.

## 2023-06-25 NOTE — Consult Note (Signed)
NAME: Ryan Miranda  DOB: 14-Oct-1975  MRN: [id number]  Date/Time: 06/25/2023 9:40 PM  REQUESTING PROVIDER: Dr.Assaker Subjective:  REASON FOR CONSULT: cdiff/fever ? Ryan Miranda is a 47 y.o. male with a history of ETOH abuse, HTN, Depression presented to the ED with suicidal ideation after drinking 1/2 gallon of liquor on 11/29.   Vitals in the ED  06/15/23 17:05  BP 137/109 (H)  Temp 98 F (36.7 C)  Pulse Rate 125 !  Resp 21 !  SpO2 95 %     Latest Reference Range & Units 06/15/23 16:57  WBC 4.0 - 10.5 K/uL 15.6 (H)  Hemoglobin 13.0 - 17.0 g/dL 84.1  HCT 66.0 - 63.0 % 46.4  Platelets 150 - 400 K/uL 366  Creatinine 0.61 - 1.24 mg/dL 1.60   HE became tachycardic and hypertensive and anxious in the ED and received benzodiazepine for DTS. Also received Phenobarbital- He was hypokalemic, hyponatremic ammonia was elevated and received rx including lactulose ( 12/1 -one dose) He was admitted to the hospitalist team in Step [facility] on 06/17/23 He was followed  by psychiatrist HE was on precedex with fluctuating metal status, also had small bowel and gastric distension concerning for ileus VS SBO Over the weekend he had fever and has been started on zosyn on 06/24/23 I am asked to see hi for the fever and also to r/o cdiff     Past Medical History:  Diagnosis Date   Depression    Elevated ferritin level    Elevated LFTs    Hypertension    Iron overload 02/06/2019   Laryngopharyngeal reflux (LPR)    OSA on CPAP    Tremors of nervous system    Unintentional weight loss     Past Surgical History:  Procedure Laterality Date   BREAST EXCISIONAL BIOPSY Left 1993   neg   COLONOSCOPY     MASTECTOMY PARTIAL / LUMPECTOMY      Social History   Socioeconomic History   Marital status: Married    Spouse name: Not on file   Number of children: Not on file   Years of education: Not on file   Highest education level: Not on file  Occupational History   Not on  file  Tobacco Use   Smoking status: Never   Smokeless tobacco: Never  Substance and Sexual Activity   Alcohol use: Not Currently    Alcohol/week: 0.0 standard drinks of alcohol   Drug use: Not Currently   Sexual activity: Yes  Other Topics Concern   Not on file  Social History Narrative   Not on file   Social Determinants of Health   Financial Resource Strain: High Risk (03/27/2023)   Received from [facility]   Overall Financial Resource Strain (CARDIA)    Difficulty of Paying Living Expenses: Hard  Food Insecurity: No Food Insecurity (06/17/2023)   Hunger Vital Sign    Worried About Running Out of Food in the Last Year: Never true    Ran Out of Food in the Last Year: Never true  Recent Concern: Food Insecurity - Food Insecurity Present (03/27/2023)   Received from [facility]   Hunger Vital Sign    Worried About Running Out of Food in the Last Year: Sometimes true    Ran Out of Food in the Last Year: Sometimes true  Transportation Needs: Patient Unable To Answer (06/21/2023)   PRAPARE - Transportation    Lack of Transportation (Medical): Patient unable to answer  Lack of Transportation (Non-Medical): Patient unable to answer  Physical Activity: Patient Unable To Answer (03/27/2023)   Received from [facility]   Exercise Vital Sign    Days of Exercise per Week: Patient unable to answer    Minutes of Exercise per Session: Patient unable to answer  Stress: Stress Concern Present (03/27/2023)   Received from [facility] - Occupational Stress Questionnaire    Feeling of Stress : Very much  Social Connections: Unknown (03/27/2023)   Received from [facility]   Social Connection and Isolation Panel [NHANES]    Frequency of Communication with Friends and Family: Once a week    Frequency of Social Gatherings with Friends and Family: Not on file     Attends Religious Services: Never    Database administrator or Organizations: No    Attends Banker Meetings: Never    Marital Status: Separated  Intimate Partner Violence: Patient Unable To Answer (06/21/2023)   Humiliation, Afraid, Rape, and Kick questionnaire    Fear of Current or Ex-Partner: Patient unable to answer    Emotionally Abused: Patient unable to answer    Physically Abused: Patient unable to answer    Sexually Abused: Patient unable to answer    Family History  Problem Relation Age of Onset   Cancer Mother    Hypertension Father    Cancer Maternal Aunt    Cancer Maternal Grandfather    No Known Allergies I? Current Facility-Administered Medications  Medication Dose Route Frequency Provider Last Rate Last Admin   acetaminophen (TYLENOL) tablet 650 mg  650 mg Oral Q6H PRN Mansy, Jan A, MD   650 mg at 06/25/23 4034   Or   acetaminophen (TYLENOL) suppository 650 mg  650 mg Rectal Q6H PRN Mansy, Jan A, MD   650 mg at 06/23/23 0211   [START ON 06/26/2023] Chlorhexidine Gluconate Cloth 2 % PADS 6 each  6 each Topical Daily Harlon Ditty D, NP       dexmedetomidine (PRECEDEX) 400 MCG/100ML (4 mcg/mL) infusion  0-1.2 mcg/kg/hr Intravenous Titrated Rust-Chester, Cecelia Byars, NP   Stopped at 06/25/23 1630   enoxaparin (LOVENOX) injection 40 mg  40 mg Subcutaneous Q24H Mansy, Jan A, MD   40 mg at 06/25/23 1111   TPN (CLINIMIX-E) Adult   Intravenous Continuous TPN Tressie Ellis, RPH 42 mL/hr at 06/25/23 1900 Infusion Verify at 06/25/23 1900   And   fat emulsion 20 % (SMOFLIPID) infusion  250 mL Intravenous Continuous TPN Tressie Ellis, RPH 20.8 mL/hr at 06/25/23 1900 Infusion Verify at 06/25/23 1900   insulin aspart (novoLOG) injection 0-9 Units  0-9 Units Subcutaneous Q8H Tressie Ellis, RPH   1 Units at 06/25/23 1623   magnesium hydroxide (MILK OF MAGNESIA) suspension 30 mL  30 mL Oral Daily PRN Mansy, Jan A, MD       norepinephrine (LEVOPHED) 16 mg in  (0.064 mg/mL) premix infusion  0-40 mcg/min Intravenous Titrated Harlon Ditty D, NP   Stopped at 06/25/23 1622   OLANZapine (ZYPREXA) injection 10 mg  10 mg Intramuscular Once Judithe Modest, NP       OLANZapine zydis (ZYPREXA) disintegrating tablet 10 mg  10 mg Oral Q8H PRN Sindy Guadeloupe, NP   10 mg at 06/25/23 0416   ondansetron (ZOFRAN) tablet 4 mg  4 mg Oral Q6H PRN Mansy, Vernetta Honey, MD       Or  ondansetron (ZOFRAN) injection 4 mg  4 mg Intravenous Q6H PRN Mansy, Jan A, MD   4 mg at 06/25/23 0531   Oral care mouth rinse  15 mL Mouth Rinse PRN Alford Highland, MD       pantoprazole (PROTONIX) injection 40 mg  40 mg Intravenous Q24H Rust-Chester, Cecelia Byars, NP   40 mg at 06/25/23 2106   piperacillin-tazobactam (ZOSYN) IVPB 3.375 g  3.375 g Intravenous Zollie Beckers, NP 12.5 mL/hr at 06/25/23 2104 3.375 g at 06/25/23 2104   potassium chloride 10 mEq in 100 mL IVPB  10 mEq Intravenous Q1 Hr x 2 Rust-Chester, Cecelia Byars, NP 100 mL/hr at 06/25/23 2106 10 mEq at 06/25/23 2106   QUEtiapine (SEROQUEL) tablet 50 mg  50 mg Per Tube BID Janann Colonel, MD   50 mg at 06/25/23 2109   [START ON 06/26/2023] sertraline (ZOLOFT) tablet 50 mg  50 mg Per Tube Daily Assaker, [place], MD       sodium chloride flush (NS) 0.9 % injection 10-40 mL  10-40 mL Intracatheter Q12H Erin Fulling, MD   10 mL at 06/25/23 2109   sodium chloride flush (NS) 0.9 % injection 10-40 mL  10-40 mL Intracatheter PRN Erin Fulling, MD       thiamine (VITAMIN B1) injection 100 mg  100 mg Intravenous Daily Harlon Ditty D, NP   100 mg at 06/25/23 1113   traZODone (DESYREL) tablet 25 mg  25 mg Per Tube QHS PRN Tressie Ellis, RPH         Abtx:  Anti-infectives (From admission, onward)    Start     Dose/Rate Route Frequency Ordered Stop   06/24/23 0500  piperacillin-tazobactam (ZOSYN) IVPB 3.375 g        3.375 g 12.5 mL/hr over 240 Minutes Intravenous Every 8 hours 06/24/23 0442         REVIEW OF SYSTEMS:   NA Objective:  VITALS:  BP 123/73   Pulse (!) 114   Temp 98.6 F (37 C) (Oral)   Resp 20   Ht 5\' 10"  (1.778 m)   Wt 99.7 kg   SpO2 95%   BMI 31.54 kg/m  LDA Foley Central line Other drainage tubes PHYSICAL EXAM:  General: Awake, confused, agitated oriented in person , temporarily restrained Head: Normocephalic, without obvious abnormality, atraumatic. Eyes: Conjunctivae clear, anicteric sclerae. Pupils are equal ENT Nares normal. No drainage or sinus tenderness. Lips, mucosa, and tongue normal. No Thrush Neck: Supple, symmetrical, no adenopathy, thyroid: non tender no carotid bruit and no JVD. Lungs:b/l air entry Heart: TAchycardia Abdomen: Soft, non-tender,not distended. Bowel sounds normal. No masses Extremities: atraumatic, no cyanosis. No edema. No clubbing Skin: No rashes or lesions. Or bruising Lymph: Cervical, supraclavicular normal. Neurologic: Grossly non-focal Pertinent Labs Lab Results CBC    Component Value Date/Time   WBC 16.5 (H) 06/25/2023 0507   RBC 4.27 06/25/2023 0507   HGB 12.7 (L) 06/25/2023 0507   HCT 37.2 (L) 06/25/2023 0507   PLT 413 (H) 06/25/2023 0507   MCV 87.1 06/25/2023 0507   MCH 29.7 06/25/2023 0507   MCHC 34.1 06/25/2023 0507   RDW 14.1 06/25/2023 0507   LYMPHSABS 1.3 03/05/2019 1318   MONOABS 1.0 03/05/2019 1318   EOSABS 0.0 03/05/2019 1318   BASOSABS 0.1 03/05/2019 1318       Latest Ref Rng & Units 06/25/2023    2:54 PM 06/25/2023    5:07 AM 06/24/2023    5:32 AM  CMP  Glucose 70 -  99 mg/dL  161  92   BUN 6 - 20 mg/dL  6  7   Creatinine 0.96 - 1.24 mg/dL  0.45  4.09   Sodium 811 - 145 mmol/L  135  134   Potassium 3.5 - 5.1 mmol/L 3.3  2.8  3.8   Chloride 98 - 111 mmol/L  95  96   CO2 22 - 32 mmol/L  28  25   Calcium 8.9 - 10.3 mg/dL  8.5  8.2       Microbiology: Recent Results (from the past 240 hour(s))  MRSA Next Gen by PCR, Nasal     Status: None   Collection Time: 06/17/23  9:30 AM   Specimen: Nasal Mucosa;  Nasal Swab  Result Value Ref Range Status   MRSA by PCR Next Gen NOT DETECTED NOT DETECTED Final    Comment: (NOTE) The GeneXpert MRSA Assay (FDA approved for NASAL specimens only), is one component of a comprehensive MRSA colonization surveillance program. It is not intended to diagnose MRSA infection nor to guide or monitor treatment for MRSA infections. Test performance is not FDA approved in patients less than 43 years old. Performed at [facility], [address] [place], [place] [address]   Culture, blood (Routine X 2) w Reflex to ID Panel     Status: None (Preliminary result)   Collection Time: 06/24/23  5:38 AM   Specimen: BLOOD  Result Value Ref Range Status   Specimen Description BLOOD BLOOD LEFT ARM  Final   Special Requests   Final    BOTTLES DRAWN AEROBIC AND ANAEROBIC Blood Culture adequate volume   Culture   Final    NO GROWTH 1 DAY Performed at [facility], [address] [place], [place] [address]    Report Status PENDING  Incomplete  Culture, blood (Routine X 2) w Reflex to ID Panel     Status: None (Preliminary result)   Collection Time: 06/24/23  5:40 AM   Specimen: BLOOD  Result Value Ref Range Status   Specimen Description BLOOD BLOOD LEFT HAND  Final   Special Requests   Final    BOTTLES DRAWN AEROBIC AND ANAEROBIC Blood Culture adequate volume   Culture   Final    NO GROWTH 1 DAY Performed at [facility], [address] [place], [place] [address]    Report Status PENDING  Incomplete    IMAGING RESULTS:  I have personally reviewed the films ?basilar atelectasis Xray abdomen  Dilated bowel loops- small intestine  Impression/Recommendation Pt with ETOH abuse presenting with suicidal ideation  Encephalopathy  Alcohol withdrawal/ DT needing precedex , phenobarbitol Fluctuating delirium  New fever could be from agitation - check CK NO aspiration, blood culture neg, no UTI Currently on zosyn. Await cultures  before we can stop antibiotic   Pt has intermittent diarrhea- as per nurse very minimal today Has small intestinal dilatation Usually cdiff involves colon Currently no convincing evidence of Clostridium- monitor closely, low threshold for testing   Hypokalemia- is it due to alcoholism VS diarrhea Being replenished  Hepatic steatosis  Mild leucocytosis   Discussed the management with his nurse and requesting provider  ________________________________________________

## 2023-06-25 NOTE — Progress Notes (Signed)
OT Cancellation Note  Patient Details Name: Keyaun Matta MRN: [id number] DOB: Jan 20, 1976   Cancelled Treatment:    Reason Eval/Treat Not Completed: Patient's level of consciousness. Order received, chart review. Per chart, pt agitated this AM and requiring sedation. Not appropriate for OT at this time. Will initiate services as pt appropriate.   Butch Penny, SOT

## 2023-06-25 NOTE — BH Assessment (Signed)
PT  PLACED  UNDER  IVC PAPERS  PER  J  LEE NP  INFORMED  RN Lynnae January

## 2023-06-25 NOTE — Progress Notes (Signed)
PHARMACY CONSULT NOTE - FOLLOW UP  Pharmacy Consult for Electrolyte Monitoring and Replacement   Recent Labs: Potassium (mmol/L)  Date Value  06/25/2023 3.3 (L)   Magnesium (mg/dL)  Date Value  95/62/1308 2.0   Calcium (mg/dL)  Date Value  65/78/4696 8.5 (L)   Albumin (g/dL)  Date Value  29/52/8413 3.2 (L)   Phosphorus (mg/dL)  Date Value  24/40/1027 3.1   Sodium (mmol/L)  Date Value  06/25/2023 135     Assessment: 47 y/o male with h/o depression, SI, GERD, OSA, etoh abuse, cirrhosis, HTN, breast mass s/p mastectomy (90s), asthma and tremors who is admitted from [facility] with severe alcohol withdrawal with delirium tremens and abdominal distension. Pharmacy is asked to follow and replace electrolytes  Goal of Therapy:  Electrolytes WNL  Plan:  ---last dose of 10 mEq IV KCl still infusing: will add one additional 10 mEq IV KCl dose ---recheck electrolytes in am  Lowella Bandy ,PharmD Clinical Pharmacist 06/25/2023 3:52 PM

## 2023-06-25 NOTE — Progress Notes (Addendum)
NAME:  Humzah Filla, MRN:  [id number], DOB:  Aug 08, 1975, LOS: 8 ADMISSION DATE:  06/15/2023, CONSULTATION DATE:  06/20/2023 REFERRING MD:  Dr. Renae Gloss, CHIEF COMPLAINT:  Severe Alcohol Withdrawal   Brief Pt Description / Synopsis:  47 y.o. male who was to be admitted to behavioral health for suicidal ideation.  While in ED boarding, course complicated by development of severe alcohol withdrawals and Delirium Tremens, along with concern for ileus vs Small Bowel Obstruction.  History of Present Illness:  Rodgerick Isaak is a 47 y.o. male with medical history significant for essential hypertension, depression, OSA on CPAP and alcohol abuse, presented to the emergency room with acute onset of suicidal ideation and on 11/29 which have been recurrent lately and got worse over the last 3 days.  He reported that he loaded his shotgun earlier during the day with a plan to kill himself but was stopped by son.    He was barding in the ED while waiting for behavioral health admission.  He was to be transferred to [facility], however he developed alcohol withdrawal with hallucination and delirium which required medical admission under [facility] service.  ED Course: Initial Vital Signs:  BP 171/131, tachycardia at 140,  Significant Labs: hyponatremia 129 and potassium of 3.1 and chloride 92 calcium 8.7 with total bili of 2.9. CBC showed leukocytosis of 13.2 compared to 15.6 at couple days ago. Tylenol level was less than 10 a couple days ago and salicylate level less than 7. Alcohol level then was 365  Imaging CT Head>>IMPRESSION: No acute intracranial abnormality. Medications Administered:  2L NS bolus, IM phenobarbital, Zyprexa, 2 mg IV Ativan x3 does, 5 mg Haldol, 10 mg Hydralazine, Valium 10 mg x2 doses, 5 mg Haldol   He was admitted by the Hospitalist service for further workup and treatment. Please see "Significant Hospital Events" for further workup and  treatment.   Pertinent  Medical History   Past Medical History:  Diagnosis Date   Depression    Elevated ferritin level    Elevated LFTs    Hypertension    Iron overload 02/06/2019   Laryngopharyngeal reflux (LPR)    OSA on CPAP    Tremors of nervous system    Unintentional weight loss     Micro Data:  12/1: HIV screen>>negative 12/8: Blood culture x2>> no growth to date  Antimicrobials:   Anti-infectives (From admission, onward)    Start     Dose/Rate Route Frequency Ordered Stop   06/24/23 0500  piperacillin-tazobactam (ZOSYN) IVPB 3.375 g        3.375 g 12.5 mL/hr over 240 Minutes Intravenous Every 8 hours 06/24/23 0442         Significant Hospital Events: Including procedures, antibiotic start and stop dates in addition to other pertinent events   11/29: Presented wit suicidal ideation.  Placed under IVC, To be admitted to behavioral health. 11/30: Was to be transferred to [facility] behavioral [facility], however CIWA score too high in setting of developing alcohol withdrawal.  Hospitilaist asked to admit for medical management of withdrawals. 12/1: Vital stable, potassium with further decline to 2.7 which is being repleted, magnesium 2.5, improving leukocytosis at 11.3.  CIWA score of 4. 12/2: Vital stable, overnight became very agitated and combative and started on Precedex infusion.  Currently sedated. 12/3: Patient again became very agitated and combative in the evening, started on phenobarbital. 12/4: Remains on Precedex. Mental status fluctuates b/w sedated and agitation. PCCM consulted.  Abdomen  distended, KUB concerning for possible SBO. 12/5: Nursing unsuccessful at placing NGT overnight x2, since reported multiple BM's overnight.  Remains on Precedex, weaning down.  Leukocytosis improving. 12/6 DT slowly improving, remains on precedex 12/7 worsening DT's, US shows Liver disease, CT adb +Ileus. PICC line placed 12/8: Positive for fevers, high risk for  aspiration, Zosyn started. Weaned off Precedex, mental status improved, working with PT. 12/9: Earlier this morning with severe agitation and aggression towards staff, Precedex resumed along with pushes of ativan and morphine.  Became hypotensive with heavy sedation requiring Levophed.  Adding scheduled Seroquel to assist with Precedex weaning.  ID weighing in on intermittent fevers and diarrhea.  Interim History / Subjective:  AS OUTLINED ABOVE   Objective   Blood pressure (!) 113/58, pulse (!) 109, temperature 98.4 F (36.9 C), temperature source Oral, resp. rate 12, height 5\' 10"  (1.778 m), weight 99.7 kg, SpO2 100%.        Intake/Output Summary (Last 24 hours) at 06/25/2023 0736 Last data filed at 06/25/2023 0631 Gross per 24 hour  Intake 725.45 ml  Output 1600 ml  Net -874.55 ml   Filed Weights   06/23/23 0500 06/24/23 0600 06/25/23 0500  Weight: 102.6 kg 99.7 kg 99.7 kg    Examination: General: Acute ill appearing male, laying in bed, heavily sedated on precedex, on nasal cannula, currently protecting his airway, in NAD HENT: Atraumatic, normocephalic, neck supple, no JVD, NGT in place to LIS Lungs: Clear diminished breath sounds with mild expiratory wheezing throughout, even, nonlabored, normal effort Cardiovascular:  RRR, s1s2, no M/R/G Abdomen: Abdominal exam is limited given mental status: soft, no tenderness to palpation, no guarding or rebound tenderness, BS + x4,  Extremities: Normal bulk and tone, no deformities, no edema, no cyanosis Neuro: Heavily sedated, currently not following commands or withdrawing to pain, pupils PERRLA (sluggish 1 mm bilaterally) GU: External male catheter in place draining dark yellow urine  Resolved Hospital Problem list     Assessment & Plan:   #Severe Alcohol Withdrawal with Delirium Tremens #Suicidal Ideation PMHx: Depression -Treatment of DT's & metabolic derangements as outlined below -Provide supportive care -Promote normal  sleep/wake cycle and family presence -Avoid sedating medications as able -Completed Phenobarbital taper -CIWA protocol -Completed high dose Thiamine for total of 3 days, continue with 100 mg daily -Continue Folic acid, MVI -Continue IVC and suicide sitter/precautions -Psych consult for inpatient admission once through DT's -Adding scheduled Seroquel to assist with Precedex weaning, Once BP stabilized, consider adding Clonidine as well   #Hypotension, suspect sedation related -Continuous cardiac monitoring -Maintain MAP >65 -IV fluids -Vasopressors as needed to maintain MAP goal -Trend lactic acid until normalized  #Hypokalemia #Hyponatremia, likely secondary to beer potomania ~ RESOLVED #Hypomagnesemia ~ RESOLVED  -Monitor I&O's / urinary output -Follow BMP -Ensure adequate renal perfusion -Avoid nephrotoxic agents as able -Replace electrolytes as indicated ~ Pharmacy following for assistance with electrolyte replacement  #Abdominal Distention with concern for Ileus vs Small Bowel Obstruction ~ IMPROVED -NPO, continue TPN -NGT to LIS -Follow serial abdominal exams and serial KUB's -Low threshold for General Surgery consult  #Leukocytosis UA negative for UTI Chest X-ray without evidence of pneumonia -Monitor fever curve -Trend WBC's & Procalcitonin -Follow cultures as above -Continue empiric Zosyn pending cultures & sensitivities -ID to evaluate for recommendations with intermittent fevers and diarrhea       Pt is critically ill with severe DT's and SBO.  Currently protecting his airway, but high risk for aspiration,  decompensation necessitating intubation,  cardiac arrest, and death.  Prognosis is guarded.   Best Practice (right click and "Reselect all SmartList Selections" daily)   Diet/type: NPO, TPN DVT prophylaxis: LMWH GI prophylaxis: PPI Lines: PICC line, and is still needed Foley:  N/A Code Status:  full code Last date of multidisciplinary goals of care  discussion [12/9]  12/9:  Updated pt's significant other Remer Macho via telephone on plan of care.  All questions answered.  Labs   CBC: Recent Labs  Lab 06/21/23 0455 06/22/23 0358 06/23/23 0441 06/24/23 0538 06/25/23 0507  WBC 10.7* 10.4 11.4* 14.6* 16.5*  HGB 12.2* 13.5 11.3* 11.8* 12.7*  HCT 35.5* 40.0 32.5* 34.7* 37.2*  MCV 87.0 87.1 86.2 87.4 87.1  PLT 190 203 277 350 413*    Basic Metabolic Panel: Recent Labs  Lab 06/22/23 0358 06/23/23 0441 06/23/23 2127 06/24/23 0532 06/24/23 0538 06/25/23 0507  NA 132* 136 136 134*  --  135  K 3.7 2.9* 3.1* 3.8  --  2.8*  CL 95* 100 97* 96*  --  95*  CO2 24 28 28 25   --  28  GLUCOSE 104* 120* 104* 92  --  111*  BUN 10 8 8 7   --  6  CREATININE 0.78 0.76 0.76 0.83  --  0.72  CALCIUM 8.4* 8.0* 8.4* 8.2*  --  8.5*  MG 2.6* 1.9 1.9  --  2.4 2.1  PHOS 2.7 2.0* 3.4 2.7  --  3.1   GFR: Estimated Creatinine Clearance: 132.2 mL/min (by C-G formula based on SCr of 0.72 mg/dL). Recent Labs  Lab 06/20/23 1216 06/21/23 0455 06/22/23 0358 06/23/23 0441 06/24/23 0538 06/25/23 0507  PROCALCITON 0.18  --   --   --  0.43  --   WBC 14.8*   < > 10.4 11.4* 14.6* 16.5*   < > = values in this interval not displayed.    Liver Function Tests: Recent Labs  Lab 06/20/23 0433 06/21/23 0455 06/22/23 0358 06/23/23 0441 06/24/23 0532 06/25/23 0507  AST 46* 50* 37  --   --   --   ALT 31 30 26   --   --   --   ALKPHOS 81 72 76  --   --   --   BILITOT 1.2* 1.5* 1.2*  --   --   --   PROT 6.3* 6.4* 6.8  --   --   --   ALBUMIN 3.2* 3.3*  3.3* 3.3*  3.4* 3.0* 2.9* 3.2*   Recent Labs  Lab 06/22/23 0358  LIPASE 35   Recent Labs  Lab 06/21/23 0455  AMMONIA 35    ABG No results found for: "PHART", "PCO2ART", "PO2ART", "HCO3", "TCO2", "ACIDBASEDEF", "O2SAT"   Coagulation Profile: No results for input(s): "INR", "PROTIME" in the last 168 hours.  Cardiac Enzymes: No results for input(s): "CKTOTAL", "CKMB", "CKMBINDEX",  "TROPONINI" in the last 168 hours.  HbA1C: No results found for: "HGBA1C"  CBG: Recent Labs  Lab 06/20/23 2318 06/21/23 0427 06/21/23 2332 06/25/23 0007 06/25/23 0535  GLUCAP 126* 106* 121* 106* 98    Review of Systems:   Unable to assess due to AMS   Past Medical History:  He,  has a past medical history of Depression, Elevated ferritin level, Elevated LFTs, Hypertension, Iron overload (02/06/2019), Laryngopharyngeal reflux (LPR), OSA on CPAP, Tremors of nervous system, and Unintentional weight loss.   Surgical History:   Past Surgical History:  Procedure Laterality Date   BREAST EXCISIONAL BIOPSY Left 1993   neg  COLONOSCOPY     MASTECTOMY PARTIAL / LUMPECTOMY       Social History:   reports that he has never smoked. He has never used smokeless tobacco. He reports that he does not currently use alcohol. He reports that he does not currently use drugs.   Family History:  His family history includes Cancer in his maternal aunt, maternal grandfather, and mother; Hypertension in his father.   Allergies No Known Allergies   Home Medications  Prior to Admission medications   Medication Sig Start Date End Date Taking? Authorizing Provider  amitriptyline (ELAVIL) 10 MG tablet Take 10 mg by mouth at bedtime.   Yes [provider]  ibuprofen (ADVIL) 200 MG tablet Take 200 mg by mouth every 6 (six) hours as needed for fever or mild pain (pain score 1-3).   Yes [provider]  pantoprazole (PROTONIX) 40 MG tablet Take 40 mg by mouth daily.   Yes [provider]  sertraline (ZOLOFT) 50 MG tablet Take 50 mg by mouth daily.   Yes [provider]     Critical care time: 42 minutes     Harlon Ditty, AGACNP-BC [facility] Prefer epic messenger for cross cover needs If after hours, please call E-link

## 2023-06-25 NOTE — Consult Note (Signed)
Pt chart reviewed and assessed face to face. Pt with 1:1 sitter. Currently sedated. Spoke w/ RN pt may be able to participate in assessment later in the day. Psychiatry to continue to follow and attempt assessment.

## 2023-06-25 NOTE — Plan of Care (Signed)
  Problem: Health Behavior/Discharge Planning: Goal: Ability to manage health-related needs will improve Outcome: Progressing   Problem: Clinical Measurements: Goal: Ability to maintain clinical measurements within normal limits will improve Outcome: Progressing Goal: Will remain free from infection Outcome: Progressing Goal: Respiratory complications will improve Outcome: Progressing   Problem: Activity: Goal: Risk for activity intolerance will decrease Outcome: Progressing   Problem: Elimination: Goal: Will not experience complications related to bowel motility Outcome: Not Progressing Goal: Will not experience complications related to urinary retention Outcome: Progressing   Problem: Coping: Goal: Level of anxiety will decrease Outcome: Not Progressing   Problem: Skin Integrity: Goal: Risk for impaired skin integrity will decrease Outcome: Progressing   Problem: Pain Management: Goal: General experience of comfort will improve Outcome: Progressing

## 2023-06-25 NOTE — Progress Notes (Signed)
Patient is still very disoriented and confused about why he is here and the treatment plan. He is complaining of pain and continues to pull at care devices. Administered PRN's when able according to treatment needs. Sitter still at bedside. Patient in the lowest position in bed, head of bed at 30 degrees due to coughing, call bell within reach, will continue to monitor.

## 2023-06-25 NOTE — Progress Notes (Signed)
Patient responsive to pain, soft pressures, NSR.   Patient BPs continued to remain soft, NP aware, new orders placed.  Patient not responsive to bolus x2, Levo started. Titrated per NPs verbal orders as clinical presentation warranted. Precedex stopped.  Patient trying to get out bed, combative, threatening staff. Patient attempting to hit/kick staff. Patient A/Ox1, unable to redirect/orient. Violent restraints initiated per NP orders, Precedex restarted.   Violent restraints discontinued, non-violent initiated. Patient drowsy but confused. Patient continues to pull on lines/tubes  Restraints discontinued. Patient resting in bed, confused, but able to redirect/orient.   Patient A/O x3, pleasant

## 2023-06-25 NOTE — Progress Notes (Signed)
Patient  very agitated this morning and difficult to re-orient despite multiple PRNs. Attempting to get out of bed requiring multiple staff members and security to hold him in bed. Patient remains a danger to self and other at this time. Precedex for sedation. Will start Seroquel and wean off Precedex as tolerated. Obtain serial EKGs to monitor Qtc.   Webb Silversmith, DNP, CCRN, FNP-C, AGACNP-BC Acute Care & Family Nurse Practitioner  [facility]  See Amion for personal pager PCCM on call pager [phone] until 7 am

## 2023-06-25 NOTE — Progress Notes (Signed)
PT Cancellation Note  Patient Details Name: Brenley Martinsen MRN: [id number] DOB: 1976/01/21   Cancelled Treatment:    Reason Eval/Treat Not Completed: Medical issues which prohibited therapy (Agitation requiring sedation. Not appropriate or safe for PT intervention at this time given level of consciousness and behaviors.)  Donna Bernard, PT, MPT  Ina Homes 06/25/2023, 11:06 AM

## 2023-06-25 NOTE — Progress Notes (Signed)
Nutrition Follow Up Note   DOCUMENTATION CODES:   Obesity unspecified  INTERVENTION:   TPN per pharmacy- provides 1160kcal/day and 80g/day protein (M-F)  Once appropriate for tube feeds, recommend:  Osmolite 1.5@60ml /hr- Initiate at 47ml/hr, once tolerating, increase by 46ml/hr q 8 hours until goal rate is reached.   ProSource TF 20- Give 60ml daily via tube, each supplement provides 80kcal and 20g of protein.   Free water flushes 30ml q4 hours to maintain tube patency   Regimen provides 2240kcal/day, 110g/day protein and 1238ml/day of free water   Daily weights   NUTRITION DIAGNOSIS:   Inadequate oral intake related to lethargy/confusion, altered GI function as evidenced by NPO status.  GOAL:   Patient will meet greater than or equal to 90% of their needs -not met   MONITOR:   Labs, Weight trends, I & O's, Skin, TPN  ASSESSMENT:   47 y/o male with h/o depression, SI, GERD, OSA, etoh abuse, cirrhosis, HTN, breast mass s/p mastectomy (90s), asthma and tremors who is admitted from [facility] with severe alcohol withdrawal with delirium tremens and abdominal distension.  -Pt s/p fluoroscopy guided NGT placement 12/6 (gastric)  Pt continues to have AMS; delirium waxes and wanes. Pt noted to have ileus on CT scan from 12/6. Pt initiated on TPN 12/8 and is tolerating well. Pt is refeeding; electrolytes being monitored and supplemented by pharmacy. NGT remains in place to LIS with output. Pt with ongoing distension but this does seem slightly improved on exam today. Will plan to initiate trickle tube feeds once bowel function returns. Per chart, pt is down ~7lbs since admission.    Medications reviewed and include: lovenox, insulin, protonix, thiamine, levophed, zosyn, KCl  Labs reviewed: Na 135 wnl, K 2.8(L), P 3.1 wnl, Mg 2.1 wnl Triglycerides- 119 Cbgs- 95, 98, 106 x 24 hrs   Diet Order:   Diet Order             Diet NPO time specified Except for: Ice Chips, Other (See  Comments)  Diet effective now                  EDUCATION NEEDS:   No education needs have been identified at this time  Skin:  Skin Assessment: Reviewed RN Assessment  Last BM:  12/7- type 7  Height:   Ht Readings from Last 1 Encounters:  06/18/23 5\' 10"  (1.778 m)    Weight:   Wt Readings from Last 1 Encounters:  06/25/23 99.7 kg    Ideal Body Weight:  75 kg  BMI:  Body mass index is 31.54 kg/m.  Estimated Nutritional Needs:   Kcal:  2100-2400kcal/day  Protein:  105-120g/day  Fluid:  2.3-2.6L/day  Betsey Holiday MS, RD, LDN Please refer to [facility] for RD and/or RD on-call/weekend/after hours pager

## 2023-06-25 NOTE — Progress Notes (Signed)
Patient threaten to "kick her teeth in" as the sitter attempted to help keep patient in bed and safe. He stated that the only way he would stay in the bed is "if someone would come fuck him in it". Patient was given PRN medications to help resolve agitation and anxiety as well as pain and nausea he also stated he was experiencing. Patient was safely returned to a safe position in the bed and vitals remain stable. Sitter at bedside. Will continue to monitor.

## 2023-06-25 NOTE — Progress Notes (Addendum)
PHARMACY - TOTAL PARENTERAL NUTRITION CONSULT NOTE   Indication: Ileus  Patient Measurements: Height: 5\' 10"  (177.8 cm) Weight: 99.7 kg (219 lb 12.8 oz) IBW/kg (Calculated) : 73   Body mass index is 31.54 kg/m.  Assessment:   47 y/o male with h/o depression, SI, GERD, OSA, etoh abuse, cirrhosis, HTN, breast mass s/p mastectomy (90s), asthma and tremors who is admitted from [facility] with severe alcohol withdrawal with delirium tremens and abdominal distension.   Glucose / Insulin:  --Last Hgb A1c was 5.2% in 03/2023 --Normoglycemic not requiring SSI Electrolytes: Hypokalemia with K 2.8 this AM Renal: Scr < 1 Hepatic: Overall normal Intake / Output: Do not have strict I&Os for the admission GI Imaging: 12/06 CT Abd Dilated loops of small bowel and colon, favoring adynamic ileus over small bowel obstruction. GI Surgeries / Procedures: none recent  Central access: 06/23/23 TPN start date: 06/24/23  RD Assessment: Estimated Needs Total Energy Estimated Needs: 2100-2400kcal/day Total Protein Estimated Needs: 105-120g/day Total Fluid Estimated Needs: 2.3-2.6L/day  Current Nutrition:  NPO  Plan:  --Clinimix E8/10 for 1L + SMOFlipid 20% 250 mL/day --Electrolytes in TPN: Na 25mEq/L, K 42mEq/L, Ca 4.68mEq/L, Mg 52mEq/L, and Phos 86mmol/L. Cl:Ac 0.49 --Add standard MVI and trace elements to TPN --Decrease to Sensitive q8h SSI and adjust as needed. Can d/c this if tolerates TPN at full rate w/out insulin requirement --Monitor TPN labs on Mon/Thurs, daily until stable. High risk re-feed  Tressie Ellis 06/25/2023,11:02 AM

## 2023-06-26 ENCOUNTER — Inpatient Hospital Stay: Payer: Managed Care, Other (non HMO)

## 2023-06-26 DIAGNOSIS — K56609 Unspecified intestinal obstruction, unspecified as to partial versus complete obstruction: Secondary | ICD-10-CM | POA: Diagnosis not present

## 2023-06-26 DIAGNOSIS — R45851 Suicidal ideations: Secondary | ICD-10-CM | POA: Diagnosis not present

## 2023-06-26 DIAGNOSIS — R509 Fever, unspecified: Secondary | ICD-10-CM

## 2023-06-26 DIAGNOSIS — F10931 Alcohol use, unspecified with withdrawal delirium: Secondary | ICD-10-CM | POA: Diagnosis not present

## 2023-06-26 DIAGNOSIS — R5081 Fever presenting with conditions classified elsewhere: Secondary | ICD-10-CM | POA: Diagnosis not present

## 2023-06-26 DIAGNOSIS — F10231 Alcohol dependence with withdrawal delirium: Secondary | ICD-10-CM

## 2023-06-26 DIAGNOSIS — D72829 Elevated white blood cell count, unspecified: Secondary | ICD-10-CM | POA: Diagnosis not present

## 2023-06-26 DIAGNOSIS — E876 Hypokalemia: Secondary | ICD-10-CM | POA: Diagnosis not present

## 2023-06-26 LAB — CBC
HCT: 35.6 % — ABNORMAL LOW (ref 39.0–52.0)
Hemoglobin: 11.9 g/dL — ABNORMAL LOW (ref 13.0–17.0)
MCH: 29.3 pg (ref 26.0–34.0)
MCHC: 33.4 g/dL (ref 30.0–36.0)
MCV: 87.7 fL (ref 80.0–100.0)
Platelets: 520 10*3/uL — ABNORMAL HIGH (ref 150–400)
RBC: 4.06 MIL/uL — ABNORMAL LOW (ref 4.22–5.81)
RDW: 14.1 % (ref 11.5–15.5)
WBC: 15.3 10*3/uL — ABNORMAL HIGH (ref 4.0–10.5)
nRBC: 0 % (ref 0.0–0.2)

## 2023-06-26 LAB — GLUCOSE, CAPILLARY
Glucose-Capillary: 92 mg/dL (ref 70–99)
Glucose-Capillary: 110 mg/dL — ABNORMAL HIGH (ref 70–99)
Glucose-Capillary: 126 mg/dL — ABNORMAL HIGH (ref 70–99)

## 2023-06-26 LAB — BASIC METABOLIC PANEL
Anion gap: 7 (ref 5–15)
BUN: 7 mg/dL (ref 6–20)
CO2: 29 mmol/L (ref 22–32)
Calcium: 8.7 mg/dL — ABNORMAL LOW (ref 8.9–10.3)
Chloride: 99 mmol/L (ref 98–111)
Creatinine, Ser: 0.65 mg/dL (ref 0.61–1.24)
GFR, Estimated: >60 mL/min (ref >60)
Glucose, Bld: 111 mg/dL — ABNORMAL HIGH (ref 70–99)
Potassium: 3.2 mmol/L — ABNORMAL LOW (ref 3.5–5.1)
Sodium: 135 mmol/L (ref 135–145)

## 2023-06-26 LAB — PHOSPHORUS: Phosphorus: 2.7 mg/dL (ref 2.5–4.6)

## 2023-06-26 LAB — MAGNESIUM: Magnesium: 1.8 mg/dL (ref 1.7–2.4)

## 2023-06-26 MED ORDER — TRACE MINERALS CU-MN-SE-ZN 300-55-60-3000 MCG/ML IV SOLN
INTRAVENOUS | Status: AC
  Filled 2023-06-26: qty 1000

## 2023-06-26 MED ORDER — PHENOL 1.4 % MT LIQD
1.0000 | OROMUCOSAL | Status: DC | PRN
  Administered 2023-06-26 (×2): 1 via OROMUCOSAL
  Filled 2023-06-26: qty 177

## 2023-06-26 MED ORDER — FAT EMUL FISH OIL/PLANT BASED 20% (SMOFLIPID)IV EMUL
250.0000 mL | INTRAVENOUS | Status: AC
  Administered 2023-06-26: 250 mL via INTRAVENOUS
  Filled 2023-06-26: qty 250

## 2023-06-26 MED ORDER — POTASSIUM CHLORIDE 20 MEQ PO PACK
40.0000 meq | PACK | Freq: Once | ORAL | Status: AC
  Administered 2023-06-26: 40 meq
  Filled 2023-06-26: qty 2

## 2023-06-26 MED ORDER — POTASSIUM CHLORIDE 10 MEQ/100ML IV SOLN
10.0000 meq | INTRAVENOUS | Status: AC
  Administered 2023-06-26 (×4): 10 meq via INTRAVENOUS
  Filled 2023-06-26 (×4): qty 100

## 2023-06-26 MED ORDER — BOOST / RESOURCE BREEZE PO LIQD CUSTOM
1.0000 | Freq: Three times a day (TID) | ORAL | Status: DC
  Administered 2023-06-26: 237 mL via ORAL

## 2023-06-26 MED ORDER — MAGNESIUM SULFATE 2 GM/50ML IV SOLN
2.0000 g | Freq: Once | INTRAVENOUS | Status: AC
  Administered 2023-06-26: 2 g via INTRAVENOUS
  Filled 2023-06-26: qty 50

## 2023-06-26 NOTE — Progress Notes (Addendum)
Physical Therapy Treatment Patient Details Name: Ryan Miranda MRN: [id number] DOB: 04-15-1976 Today's Date: 06/26/2023   History of Present Illness 47 y.o. male who was to be admitted to behavioral health for suicidal ideation.  While in ED boarding, course complicated by development of severe alcohol withdrawals and Delirium Tremens.    PT Comments  Patient is cooperative throughout session. He was able to ambulate short distances in the room with the walker with ambulation distance limited by NG tube to wall suction. Mild unsteadiness initially with walking that improves with increased mobility. Heart rate up to 130's with mobility. Anticipate no PT needs will be required after this hospital stay pending patient continues to improve functional independence.    If plan is discharge home, recommend the following: Assist for transportation;Help with stairs or ramp for entrance;Assistance with cooking/housework;A little help with walking and/or transfers;A little help with bathing/dressing/bathroom   Can travel by private vehicle        Equipment Recommendations  Rolling walker (2 wheels)    Recommendations for Other Services       Precautions / Restrictions Precautions Precautions: Fall Restrictions Weight Bearing Restrictions: No     Mobility  Bed Mobility Overal bed mobility: Modified Independent                  Transfers Overall transfer level: Needs assistance Equipment used: Rolling walker (2 wheels) Transfers: Sit to/from Stand Sit to Stand: Contact guard assist           General transfer comment: 2 standing bouts performed with cues for technique    Ambulation/Gait Ambulation/Gait assistance: Contact guard assist Gait Distance (Feet): 10 Feet (x 4 bouts) Assistive device: Rolling walker (2 wheels) Gait Pattern/deviations: Step-through pattern Gait velocity: decreased     General Gait Details: gait distance limited by NG tube to wall  suction. mild unsteadiness with ambulation but no gross loss of balance. CGA for safety.   Stairs             Wheelchair Mobility     Tilt Bed    Modified Rankin (Stroke Patients Only)       Balance Overall balance assessment: Needs assistance Sitting-balance support: Bilateral upper extremity supported Sitting balance-Leahy Scale: Normal     Standing balance support: Bilateral upper extremity supported Standing balance-Leahy Scale: Fair                              Cognition Arousal: Alert Behavior During Therapy: WFL for tasks assessed/performed Overall Cognitive Status: Within Functional Limits for tasks assessed                                          Exercises      General Comments        Pertinent Vitals/Pain Pain Assessment Pain Assessment: Faces Faces Pain Scale: Hurts a little bit Pain Location: throat    Home Living                          Prior Function            PT Goals (current goals can now be found in the care plan section) Acute Rehab PT Goals Patient Stated Goal: to move around more PT Goal Formulation: With patient Time For Goal Achievement: 07/07/23 Potential to Achieve  Goals: Good Progress towards PT goals: Progressing toward goals    Frequency    Min 1X/week      PT Plan      Co-evaluation              AM-PAC PT "6 Clicks" Mobility   Outcome Measure  Help needed turning from your back to your side while in a flat bed without using bedrails?: None Help needed moving from lying on your back to sitting on the side of a flat bed without using bedrails?: None Help needed moving to and from a bed to a chair (including a wheelchair)?: A Little Help needed standing up from a chair using your arms (e.g., wheelchair or bedside chair)?: A Little Help needed to walk in hospital room?: A Lot Help needed climbing 3-5 steps with a railing? : A Lot 6 Click Score: 18    End  of Session Equipment Utilized During Treatment: Oxygen Activity Tolerance: Patient tolerated treatment well Patient left: in bed;with call bell/phone within reach;with nursing/sitter in room Nurse Communication: Mobility status PT Visit Diagnosis: Muscle weakness (generalized) (M62.81);Difficulty in walking, not elsewhere classified (R26.2);Pain     Time: 0900-0930 PT Time Calculation (min) (ACUTE ONLY): 30 min  Charges:    $Therapeutic Activity: 23-37 mins PT General Charges $$ ACUTE PT VISIT: 1 Visit                     Donna Bernard, PT, MPT    Ina Homes 06/26/2023, 11:10 AM

## 2023-06-26 NOTE — Progress Notes (Signed)
Nutrition Follow Up Note   DOCUMENTATION CODES:   Obesity unspecified  INTERVENTION:   Continue TPN per pharmacy- provides 1160kcal/day and 80g/day protein (M-F)  Boost Breeze po TID, each supplement provides 250 kcal and 9 grams of protein  Ensure Enlive po TID with diet advancement, each supplement provides 350 kcal and 20 grams of protein.  MVI po daily once TPN discontinued   Daily weights   NUTRITION DIAGNOSIS:   Inadequate oral intake related to lethargy/confusion, altered GI function as evidenced by NPO status. -progressing   GOAL:   Patient will meet greater than or equal to 90% of their needs -not met   MONITOR:   PO intake, Supplement acceptance, Diet advancement, Labs, Weight trends, Skin, I & O's, TPN  ASSESSMENT:   47 y/o male with h/o depression, SI, GERD, OSA, etoh abuse, cirrhosis, HTN, breast mass s/p mastectomy (90s), asthma and tremors who is admitted from [facility] with severe alcohol withdrawal with delirium tremens and abdominal distension.  -Pt s/p fluoroscopy guided NGT placement 12/6 (gastric)  Met with pt in room today. Pt is more alert today and asking for NGT to be removed. RD discussed with patient the reasoning for the NGT and the importance of getting adequate nutrition. Pt asking for food and water. Pt ambulating around the unit this evening. Abdominal distension seems improved. KUB from today reporting ongoing bowel distension. Pt is having loose stools. NGT with out overnight. Clamp trial performed today with output. Will initiate clear liquid diet this evening. Continue TPN for now. RD will add supplements to help pt meet his estimated needs. Recommend continue TPN until patient is able to tolerate a GI soft diet. Will add Ensure with diet advancement. Per chart, pt is down ~14lbs since admission.      Medications reviewed and include: lovenox, protonix, thiamine, zosyn  Labs reviewed: Na 135 wnl, K 3.2(L), P 2.7 wnl, Mg 1.8 wnl Wbc-  15.3(H) Triglycerides- 119- 12/9 Cbgs- 110, 92 x 24 hrs   Diet Order:   Diet Order             Diet clear liquid Room service appropriate? Yes; Fluid consistency: Thin  Diet effective now                  EDUCATION NEEDS:   No education needs have been identified at this time  Skin:  Skin Assessment: Reviewed RN Assessment  Last BM:  12/10- TYPE 7  Height:   Ht Readings from Last 1 Encounters:  06/18/23 5\' 10"  (1.778 m)    Weight:   Wt Readings from Last 1 Encounters:  06/26/23 96.3 kg    Ideal Body Weight:  75 kg  BMI:  Body mass index is 30.46 kg/m.  Estimated Nutritional Needs:   Kcal:  2100-2400kcal/day  Protein:  105-120g/day  Fluid:  2.3-2.6L/day  Betsey Holiday MS, RD, LDN Please refer to [facility] for RD and/or RD on-call/weekend/after hours pager

## 2023-06-26 NOTE — Evaluation (Signed)
Occupational Therapy Evaluation Patient Details Name: Ryan Miranda MRN: [id number] DOB: 1975/12/09 Today's Date: 06/26/2023   History of Present Illness Ryan Miranda is a 47 y.o. male with medical history significant for essential hypertension, depression, OSA on CPAP and alcohol abuse, presented to the emergency room with acute onset of suicidal ideation and on 11/29 which have been recurrent lately and got worse over the last 3 days. During his ER stay he became significantly anxious and tachycardic and hypertensive, developed alcohol withdrawal with hallucination and delirium which required admission.   Clinical Impression   Pt was seen for OT evaluation this date. Prior to hospital admission, pt was IND with ADL's. Pt lives alone. Pt presents to acute OT demonstrating impaired ADL performance and functional mobility 2/2 (See OT problem list for additional functional deficits). Upon arrival to room pt supine in bed, agreeable to tx. Pt completed bed mobility with Mod I. Pt completed donning sock while seated at the EOB with increased time. Pt completed STS with CGA +RW. Pt ambulated around the ICU ~235ft with SBA +RW. Pt ambulated a 2 more laps ~545ft with no AD with CGA. Pt tolerated functional standing balance tasks with noted LOBx2 and self corrected. Pt max HR 150 BPM during session.  Pt followed multi-step commands with increased time with cognitive tasks (answering questions while walking). Pt would benefit from skilled OT services to address noted impairments and functional limitations (see below for and additional details) in order to maximize safety and independence while minimizing falls risk and caregiver burden. Do not anticipate the need for follow up OT services upon acute hospital DC.        If plan is discharge home, recommend the following: Assist for transportation;Assistance with cooking/housework    Functional Status Assessment  Patient has had a  recent decline in their functional status and demonstrates the ability to make significant improvements in function in a reasonable and predictable amount of time.  Equipment Recommendations  Other (comment);None recommended by OT    Recommendations for Other Services       Precautions / Restrictions Precautions Precautions: Fall Restrictions Weight Bearing Restrictions: No      Mobility Bed Mobility Overal bed mobility: Modified Independent             General bed mobility comments: assist only for line/lead management Patient Response: Cooperative  Transfers Overall transfer level: Needs assistance Equipment used: Rolling walker (2 wheels) Transfers: Sit to/from Stand Sit to Stand: Contact guard assist                  Balance Overall balance assessment: Needs assistance Sitting-balance support: No upper extremity supported, Feet supported Sitting balance-Leahy Scale: Normal     Standing balance support: No upper extremity supported Standing balance-Leahy Scale: Fair                             ADL either performed or assessed with clinical judgement   ADL Overall ADL's : Needs assistance/impaired                                     Functional mobility during ADLs: Contact guard assist;Rolling walker (2 wheels);Cueing for safety General ADL Comments: Pt ambulated around the ICU ~213ft with SBA +RW. Pt ambulated a 2 more laps ~539ft with no AD with CGA. Pt tolerated functional standing balance tasks  with noted LOBx2 and self corrected. Pt followed multi-step commands with increased time with cognitive tasks (answering questions while walking).     Vision         Perception         Praxis         Pertinent Vitals/Pain Pain Assessment Pain Assessment: No/denies pain     Extremity/Trunk Assessment Upper Extremity Assessment Upper Extremity Assessment: Overall WFL for tasks assessed   Lower Extremity  Assessment Lower Extremity Assessment: Generalized weakness       Communication Communication Communication: No apparent difficulties Cueing Techniques: Verbal cues   Cognition Arousal: Alert Behavior During Therapy: WFL for tasks assessed/performed Overall Cognitive Status: Impaired/Different from baseline                                 General Comments: Disoriented to time states Nov 19th, 2024 as today's date (12/10)     General Comments       Exercises     Shoulder Instructions      Home Living Family/patient expects to be discharged to:: Private residence Living Arrangements: Alone   Type of Home: Apartment Home Access: Stairs to enter [employer] of Steps: flight                   Home Equipment: None          Prior Functioning/Environment Prior Level of Function : Independent/Modified Independent;Working/employed;Driving             Mobility Comments: job does include prolonged standing/walking sometimes ADLs Comments: independent        OT Problem List: Decreased strength;Decreased range of motion;Decreased safety awareness;Decreased activity tolerance;Decreased knowledge of use of DME or AE;Impaired balance (sitting and/or standing)      OT Treatment/Interventions: Self-care/ADL training;Therapeutic activities;Therapeutic exercise;Patient/family education;DME and/or AE instruction    OT Goals(Current goals can be found in the care plan section) Acute Rehab OT Goals Patient Stated Goal: to get better OT Goal Formulation: With patient Time For Goal Achievement: 07/10/23 Potential to Achieve Goals: Good ADL Goals Pt Will Perform Grooming: Independently;standing Pt Will Perform Lower Body Dressing: sit to/from stand;sitting/lateral leans;Independently Pt Will Transfer to Toilet: ambulating;regular height toilet;Independently  OT Frequency: Min 1X/week    Co-evaluation              AM-PAC OT "6 Clicks"  Daily Activity     Outcome Measure Help from another person eating meals?: None Help from another person taking care of personal grooming?: None Help from another person toileting, which includes using toliet, bedpan, or urinal?: A Little Help from another person bathing (including washing, rinsing, drying)?: A Little Help from another person to put on and taking off regular upper body clothing?: None Help from another person to put on and taking off regular lower body clothing?: A Little 6 Click Score: 21   End of Session Equipment Utilized During Treatment: Rolling walker (2 wheels);Gait belt Nurse Communication: Mobility status  Activity Tolerance: Patient tolerated treatment well Patient left: in chair;with call bell/phone within reach;with nursing/sitter in room  OT Visit Diagnosis: Unsteadiness on feet (R26.81);Other abnormalities of gait and mobility (R26.89);Muscle weakness (generalized) (M62.81)                Time: 5638-7564 OT Time Calculation (min): 21 min Charges:     Butch Penny, SOT

## 2023-06-26 NOTE — Progress Notes (Signed)
Date of Admission:  06/15/2023     ID: Ryan Miranda is a 47 y.o. male Principal Problem:   Delirium tremens (HCC) Active Problems:   Depression with suicidal ideation   GERD without esophagitis   Leukocytosis   Hypokalemia   Hyponatremia   Suicidal ideation   Hypomagnesemia   Hypophosphatemia   SBO (small bowel obstruction) (HCC)    Subjective: Pt is more alert today Says he is feeling better One episode of diarrhea today  Medications:   Chlorhexidine Gluconate Cloth  6 each Topical Daily   enoxaparin (LOVENOX) injection  40 mg Subcutaneous Q24H   pantoprazole (PROTONIX) IV  40 mg Intravenous Q24H   QUEtiapine  50 mg Per Tube BID   sertraline  50 mg Per Tube Daily   sodium chloride flush  10-40 mL Intracatheter Q12H   thiamine (VITAMIN B1) injection  100 mg Intravenous Daily    Objective: Vital signs in last 24 hours: Patient Vitals for the past 24 hrs:  BP Temp Temp src Pulse Resp SpO2 Weight  06/26/23 1500 -- -- -- (!) 117 14 95 % --  06/26/23 1400 121/84 -- -- (!) 131 12 92 % --  06/26/23 1300 (!) 134/90 -- -- (!) 111 (!) 22 92 % --  06/26/23 1200 (!) 131/100 -- -- (!) 108 18 97 % --  06/26/23 1129 -- 98.7 F (37.1 C) Oral -- -- -- --  06/26/23 1100 (!) 136/91 -- -- (!) 108 20 98 % --  06/26/23 1000 121/71 -- -- (!) 119 17 92 % --  06/26/23 0900 134/85 -- -- (!) 104 (!) 9 99 % --  06/26/23 0800 128/76 98.4 F (36.9 C) Oral (!) 106 16 100 % --  06/26/23 0700 (!) 131/92 -- -- (!) 114 17 96 % --  06/26/23 0600 125/76 -- -- (!) 108 16 98 % --  06/26/23 0500 126/86 -- -- (!) 108 14 98 % --  06/26/23 0400 118/79 98.5 F (36.9 C) Oral (!) 111 16 96 % --  06/26/23 0345 -- -- -- (!) 112 14 93 % 96.3 kg  06/26/23 0300 115/77 -- -- (!) 116 16 94 % --  06/26/23 0200 118/85 -- -- (!) 113 14 95 % --  06/26/23 0100 115/67 -- -- (!) 114 18 94 % --  06/26/23 0000 (!) 109/57 98.8 F (37.1 C) Oral (!) 113 19 94 % --  06/25/23 2300 (!) 128/51 -- -- (!) 121 18  96 % --  06/25/23 2214 -- 100.1 F (37.8 C) Oral (!) 118 13 94 % --  06/25/23 2200 (!) 142/81 -- -- (!) 120 13 94 % --  06/25/23 2130 -- -- -- (!) 114 20 95 % --  06/25/23 2100 123/73 -- -- (!) 116 (!) 21 99 % --  06/25/23 2015 118/77 -- -- (!) 115 18 99 % --  06/25/23 2000 107/63 98.6 F (37 C) Oral (!) 110 17 94 % --  06/25/23 1945 124/78 -- -- (!) 111 19 98 % --  06/25/23 1930 115/71 -- -- (!) 105 14 95 % --  06/25/23 1915 122/87 -- -- (!) 110 15 98 % --  06/25/23 1900 110/78 -- -- (!) 106 19 97 % --  06/25/23 1800 (!) 115/102 -- -- (!) 101 18 98 % --  06/25/23 1700 (!) 88/62 -- -- 88 12 94 % --  06/25/23 1600 101/66 -- -- 78 13 97 % --  06/25/23 1552 -- 98.3 F (36.8  C) Axillary -- -- -- --      PHYSICAL EXAM:  General: Alert, cooperative, no distress, .  Lungs: b/l air entry- decreased bases Heart: Tachycardia Abdomen: Soft, non-tender,not distended. Bowel sounds normal. No masses Extremities: atraumatic, no cyanosis. No edema. No clubbing Skin: No rashes or lesions. Or bruising Lymph: Cervical, supraclavicular normal. Neurologic: Grossly non-focal  Lab Results    Latest Ref Rng & Units 06/26/2023    4:25 AM 06/25/2023    5:07 AM 06/24/2023    5:38 AM  CBC  WBC 4.0 - 10.5 K/uL 15.3  16.5  14.6   Hemoglobin 13.0 - 17.0 g/dL 16.1  09.6  04.5   Hematocrit 39.0 - 52.0 % 35.6  37.2  34.7   Platelets 150 - 400 K/uL 520  413  350        Latest Ref Rng & Units 06/26/2023    4:25 AM 06/25/2023    2:54 PM 06/25/2023    5:07 AM  CMP  Glucose 70 - 99 mg/dL 409   811   BUN 6 - 20 mg/dL 7   6   Creatinine 9.14 - 1.24 mg/dL 7.82   9.56   Sodium 213 - 145 mmol/L 135   135   Potassium 3.5 - 5.1 mmol/L 3.2  3.3  2.8   Chloride 98 - 111 mmol/L 99   95   CO2 22 - 32 mmol/L 29   28   Calcium 8.9 - 10.3 mg/dL 8.7   8.5       Microbiology:  Studies/Results: DG Abd 1 View  Result Date: 06/26/2023 CLINICAL DATA:  [id number] with acute hypoxic respiratory failure, [id number] with  ileus. EXAM: ABDOMEN - 1 VIEW; PORTABLE CHEST - 1 VIEW COMPARISON:  Portable chest 06/24/2023, CT abdomen pelvis 06/22/2023. FINDINGS: Chest AP portable 4:08 a.m. NGT loops around the stomach to the left with the tip in the proximal lumen right of the midline. Right PICC has been pulled back into the upper SVC, previously terminating at about the superior cavoatrial junction. There is linear scarring or atelectasis in the medial right lower lung field. The lungs are otherwise clear. The sulci are sharp. The cardiomediastinal silhouette and vascular pattern are normal. No new osseous abnormality. Multiple overlying monitor wires. Flat plate abdomen 4:69 a.m.: Small-bowel dilatation is little changed today, with central abdominal small bowel segments measuring up to 5.6 cm. There is aeration in the ascending and transverse colon without pathologic dilatation or wall edema. There is a paucity of bowel aeration in the pelvis. There is no supine evidence for free air. Pelvic phleboliths noted without pathologic calcification. IMPRESSION: 1. No acute chest findings. 2. Right PICC has been pulled back into the upper SVC, previously terminating at about the superior cavoatrial junction. 3. NGT loops around the stomach to the left with the tip in the proximal lumen right of the midline. 4. Small-bowel dilatation is little changed today, with central abdominal small bowel segments measuring up to 5.6 cm. Electronically Signed   By: Almira Bar M.D.   On: 06/26/2023 06:20   DG Chest Port 1 View  Result Date: 06/26/2023 CLINICAL DATA:  [id number] with acute hypoxic respiratory failure, [id number] with ileus. EXAM: ABDOMEN - 1 VIEW; PORTABLE CHEST - 1 VIEW COMPARISON:  Portable chest 06/24/2023, CT abdomen pelvis 06/22/2023. FINDINGS: Chest AP portable 4:08 a.m. NGT loops around the stomach to the left with the tip in the proximal lumen right of the midline. Right PICC has been pulled back into  the upper SVC, previously  terminating at about the superior cavoatrial junction. There is linear scarring or atelectasis in the medial right lower lung field. The lungs are otherwise clear. The sulci are sharp. The cardiomediastinal silhouette and vascular pattern are normal. No new osseous abnormality. Multiple overlying monitor wires. Flat plate abdomen 3:87 a.m.: Small-bowel dilatation is little changed today, with central abdominal small bowel segments measuring up to 5.6 cm. There is aeration in the ascending and transverse colon without pathologic dilatation or wall edema. There is a paucity of bowel aeration in the pelvis. There is no supine evidence for free air. Pelvic phleboliths noted without pathologic calcification. IMPRESSION: 1. No acute chest findings. 2. Right PICC has been pulled back into the upper SVC, previously terminating at about the superior cavoatrial junction. 3. NGT loops around the stomach to the left with the tip in the proximal lumen right of the midline. 4. Small-bowel dilatation is little changed today, with central abdominal small bowel segments measuring up to 5.6 cm. Electronically Signed   By: Almira Bar M.D.   On: 06/26/2023 06:20     Assessment/Plan: Pt with ETOH abuse presenting with suicidal ideation   Encephalopathy improved   Alcohol withdrawal/ DT needing precedex , phenobarbitol Fluctuating delirium   New fever could be from agitation - has resolved NO aspiration, blood culture neg, no UTI Currently on zosyn. Await cultures before we can stop antibiotic     Pt has intermittent diarrhea- as per nurse very minimal today Has small intestinal dilatation Usually cdiff involves colon Currently no convincing evidence of Clostridium- monitor closely, low threshold for testing    Hypokalemia- is it due to alcoholism VS diarrhea Being replenished   Hepatic steatosis   Mild leucocytosis    Discussed with care team

## 2023-06-26 NOTE — Progress Notes (Signed)
NAME:  Ryan Miranda, MRN:  [id number], DOB:  05-Jun-1976, LOS: 9 ADMISSION DATE:  06/15/2023, CONSULTATION DATE:  06/20/2023 REFERRING MD:  Dr. Renae Gloss, CHIEF COMPLAINT:  Severe Alcohol Withdrawal   Brief Pt Description / Synopsis:  47 y.o. male who was to be admitted to behavioral health for suicidal ideation.  While in ED boarding, course complicated by development of severe alcohol withdrawals and Delirium Tremens, along with concern for ileus vs Small Bowel Obstruction.  History of Present Illness:  Ryan Miranda is a 47 y.o. male with medical history significant for essential hypertension, depression, OSA on CPAP and alcohol abuse, presented to the emergency room with acute onset of suicidal ideation and on 11/29 which have been recurrent lately and got worse over the last 3 days.  He reported that he loaded his shotgun earlier during the day with a plan to kill himself but was stopped by son.    He was barding in the ED while waiting for behavioral health admission.  He was to be transferred to [facility], however he developed alcohol withdrawal with hallucination and delirium which required medical admission under [facility] service.  ED Course: Initial Vital Signs:  BP 171/131, tachycardia at 140,  Significant Labs: hyponatremia 129 and potassium of 3.1 and chloride 92 calcium 8.7 with total bili of 2.9. CBC showed leukocytosis of 13.2 compared to 15.6 at couple days ago. Tylenol level was less than 10 a couple days ago and salicylate level less than 7. Alcohol level then was 365  Imaging CT Head>>IMPRESSION: No acute intracranial abnormality. Medications Administered:  2L NS bolus, IM phenobarbital, Zyprexa, 2 mg IV Ativan x3 does, 5 mg Haldol, 10 mg Hydralazine, Valium 10 mg x2 doses, 5 mg Haldol   He was admitted by the Hospitalist service for further workup and treatment. Please see "Significant Hospital Events" for further workup and  treatment.   Pertinent  Medical History   Past Medical History:  Diagnosis Date   Depression    Elevated ferritin level    Elevated LFTs    Hypertension    Iron overload 02/06/2019   Laryngopharyngeal reflux (LPR)    OSA on CPAP    Tremors of nervous system    Unintentional weight loss     Micro Data:  12/1: HIV screen>>negative 12/1: MRSA PCR>>negative 12/8: Blood culture x2>> no growth to date  Antimicrobials:   Anti-infectives (From admission, onward)    Start     Dose/Rate Route Frequency Ordered Stop   06/24/23 0500  piperacillin-tazobactam (ZOSYN) IVPB 3.375 g        3.375 g 12.5 mL/hr over 240 Minutes Intravenous Every 8 hours 06/24/23 0442         Significant Hospital Events: Including procedures, antibiotic start and stop dates in addition to other pertinent events   11/29: Presented wit suicidal ideation.  Placed under IVC, To be admitted to behavioral health. 11/30: Was to be transferred to [facility], however CIWA score too high in setting of developing alcohol withdrawal.  Hospitilaist asked to admit for medical management of withdrawals. 12/1: Vital stable, potassium with further decline to 2.7 which is being repleted, magnesium 2.5, improving leukocytosis at 11.3.  CIWA score of 4. 12/2: Vital stable, overnight became very agitated and combative and started on Precedex infusion.  Currently sedated. 12/3: Patient again became very agitated and combative in the evening, started on phenobarbital. 12/4: Remains on Precedex. Mental status fluctuates b/w sedated and agitation. PCCM  consulted.  Abdomen distended, KUB concerning for possible SBO. 12/5: Nursing unsuccessful at placing NGT overnight x2, since reported multiple BM's overnight.  Remains on Precedex, weaning down.  Leukocytosis improving. 12/6 DT slowly improving, remains on precedex 12/7 worsening DT's, US shows Liver disease, CT adb +Ileus. PICC line placed 12/8: Positive for  fevers, high risk for aspiration, Zosyn started. Weaned off Precedex, mental status improved, working with PT. 12/9: Earlier this morning with severe agitation and aggression towards staff, Precedex resumed along with pushes of ativan and morphine.  Became hypotensive with heavy sedation requiring Levophed.  Adding scheduled Seroquel to assist with Precedex weaning.  ID weighing in on intermittent fevers and diarrhea. 12/10: Weaned off Precedex and Levophed, mentation much improved. No fevers overnight, GI panel negative  Interim History / Subjective:  -No significant events noted overnight  -Afebrile, hemodynamically stable, Levophed has been weaned off -Precedex weaned off, more awake, alert, and cooperative -Pscyh evaluated, recommends inpatient admission once medically cleared -No fever overnight, Leukocytosis improved to 15.3 from 16.5, no overt pneumonia on CXR -KUB shows little change in small bowel dilatation ~ will continue NGT and perform clamping trial, ambulate aggressively today    Objective   Blood pressure (!) 131/92, pulse (!) 114, temperature 98.4 F (36.9 C), temperature source Oral, resp. rate 17, height 5\' 10"  (1.778 m), weight 96.3 kg, SpO2 96%.        Intake/Output Summary (Last 24 hours) at 06/26/2023 0815 Last data filed at 06/26/2023 0604 Gross per 24 hour  Intake 1979.4 ml  Output 2650 ml  Net -670.6 ml   Filed Weights   06/24/23 0600 06/25/23 0500 06/26/23 0345  Weight: 99.7 kg 99.7 kg 96.3 kg    Examination: General: Acute ill appearing male, sitting in bed, on [place],  in NAD HENT: Atraumatic, normocephalic, neck supple, no JVD, NGT in place to LIS Lungs: Clear breath sounds throughout, even, nonlabored, normal effort Cardiovascular:  Tachycardia, regular rhythm, s1s2, no M/R/G Abdomen: Slightly distended, soft, no tenderness to palpation, no guarding or rebound tenderness, BS + x4,  Extremities: Normal bulk and tone, no deformities, no edema, no  cyanosis Neuro: Awake and alert, oriented x3, follows commands, no focal deficits, speech clear GU: External male catheter in place draining yellow urine  Resolved Hospital Problem list     Assessment & Plan:   #Severe Alcohol Withdrawal with Delirium Tremens ~ IMPROVED #Suicidal Ideation PMHx: Depression -Treatment of DT's & metabolic derangements as outlined below -Provide supportive care -Promote normal sleep/wake cycle and family presence -Avoid sedating medications as able -Completed Phenobarbital taper -CIWA protocol -Continue Seroquel 50 mg BID -Completed high dose Thiamine for total of 3 days, continue with 100 mg daily -Continue Folic acid, MVI -Continue IVC and suicide sitter/precautions -Psych consulted, appreciate input ~ recommends inpatient admission once medically cleared  #Hypotension, suspect sedation related ~ RESOLVED -Continuous cardiac monitoring -Maintain MAP >65 -IV fluids -Vasopressors as needed to maintain MAP goal -Trend lactic acid until normalized  #Hypokalemia #Hyponatremia, likely secondary to beer potomania ~ RESOLVED #Hypomagnesemia ~ RESOLVED  -Monitor I&O's / urinary output -Follow BMP -Ensure adequate renal perfusion -Avoid nephrotoxic agents as able -Replace electrolytes as indicated ~ Pharmacy following for assistance with electrolyte replacement  #Abdominal Distention with concern for Ileus vs Small Bowel Obstruction ~ IMPROVED -NPO, continue TPN -NGT to LIS ~ WILL PERFORM CLAMPING TRIAL 12/10 -Follow serial abdominal exams and serial KUB's -Low threshold for General Surgery consult  #Leukocytosis ~ IMPROVED UA negative for UTI Chest X-ray  without evidence of pneumonia -Monitor fever curve -Trend WBC's & Procalcitonin -Follow cultures as above -Continue empiric Zosyn pending cultures & sensitivities -ID evaluated for recommendations with intermittent fevers and diarrhea ~ recommends to continue Zosyn and await cultures, no  convincing evidence of C. diff      Pt is critically ill with severe DT's and SBO.  Currently protecting his airway, but high risk for aspiration,  decompensation necessitating intubation, cardiac arrest, and death.  Prognosis is guarded.   Best Practice (right click and "Reselect all SmartList Selections" daily)   Diet/type: NPO, TPN DVT prophylaxis: LMWH GI prophylaxis: PPI Lines: PICC line, and is still needed Foley:  N/A Code Status:  full code Last date of multidisciplinary goals of care discussion [12/10]  12/10:  Will update pt's significant other (ex. Wife) Ryan Miranda when she arrives at bedside.  Labs   CBC: Recent Labs  Lab 06/22/23 0358 06/23/23 0441 06/24/23 0538 06/25/23 0507 06/26/23 0425  WBC 10.4 11.4* 14.6* 16.5* 15.3*  HGB 13.5 11.3* 11.8* 12.7* 11.9*  HCT 40.0 32.5* 34.7* 37.2* 35.6*  MCV 87.1 86.2 87.4 87.1 87.7  PLT 203 277 350 413* 520*    Basic Metabolic Panel: Recent Labs  Lab 06/23/23 0441 06/23/23 2127 06/24/23 0532 06/24/23 0538 06/25/23 0507 06/25/23 1454 06/26/23 0425  NA 136 136 134*  --  135  --  135  K 2.9* 3.1* 3.8  --  2.8* 3.3* 3.2*  CL 100 97* 96*  --  95*  --  99  CO2 28 28 25   --  28  --  29  GLUCOSE 120* 104* 92  --  111*  --  111*  BUN 8 8 7   --  6  --  7  CREATININE 0.76 0.76 0.83  --  0.72  --  0.65  CALCIUM 8.0* 8.4* 8.2*  --  8.5*  --  8.7*  MG 1.9 1.9  --  2.4 2.1 2.0 1.8  PHOS 2.0* 3.4 2.7  --  3.1  --  2.7   GFR: Estimated Creatinine Clearance: 130 mL/min (by C-G formula based on SCr of 0.65 mg/dL). Recent Labs  Lab 06/20/23 1216 06/21/23 0455 06/23/23 0441 06/24/23 0538 06/25/23 0507 06/26/23 0425  PROCALCITON 0.18  --   --  0.43  --   --   WBC 14.8*   < > 11.4* 14.6* 16.5* 15.3*   < > = values in this interval not displayed.    Liver Function Tests: Recent Labs  Lab 06/20/23 0433 06/21/23 0455 06/22/23 0358 06/23/23 0441 06/24/23 0532 06/25/23 0507  AST 46* 50* 37  --   --   --   ALT  31 30 26   --   --   --   ALKPHOS 81 72 76  --   --   --   BILITOT 1.2* 1.5* 1.2*  --   --   --   PROT 6.3* 6.4* 6.8  --   --   --   ALBUMIN 3.2* 3.3*  3.3* 3.3*  3.4* 3.0* 2.9* 3.2*   Recent Labs  Lab 06/22/23 0358  LIPASE 35   Recent Labs  Lab 06/21/23 0455  AMMONIA 35    ABG No results found for: "PHART", "PCO2ART", "PO2ART", "HCO3", "TCO2", "ACIDBASEDEF", "O2SAT"   Coagulation Profile: No results for input(s): "INR", "PROTIME" in the last 168 hours.  Cardiac Enzymes: No results for input(s): "CKTOTAL", "CKMB", "CKMBINDEX", "TROPONINI" in the last 168 hours.  HbA1C: No results found for: "  HGBA1C"  CBG: Recent Labs  Lab 06/25/23 0007 06/25/23 0535 06/25/23 1113 06/25/23 1520 06/25/23 2313  GLUCAP 106* 98 95 135* 98    Review of Systems:   Positives in BOLD: Currently denies all complaints Gen: Denies fever, chills, weight change, fatigue, night sweats HEENT: Denies blurred vision, double vision, hearing loss, tinnitus, sinus congestion, rhinorrhea, sore throat, neck stiffness, dysphagia PULM: Denies shortness of breath, cough, sputum production, hemoptysis, wheezing CV: Denies chest pain, edema, orthopnea, paroxysmal nocturnal dyspnea, palpitations GI: Denies abdominal pain, nausea, vomiting, diarrhea, hematochezia, melena, constipation, change in bowel habits GU: Denies dysuria, hematuria, polyuria, oliguria, urethral discharge Endocrine: Denies hot or cold intolerance, polyuria, polyphagia or appetite change Derm: Denies rash, dry skin, scaling or peeling skin change Heme: Denies easy bruising, bleeding, bleeding gums Neuro: Denies headache, numbness, weakness, slurred speech, loss of memory or consciousness    Past Medical History:  He,  has a past medical history of Depression, Elevated ferritin level, Elevated LFTs, Hypertension, Iron overload (02/06/2019), Laryngopharyngeal reflux (LPR), OSA on CPAP, Tremors of nervous system, and Unintentional weight  loss.   Surgical History:   Past Surgical History:  Procedure Laterality Date   BREAST EXCISIONAL BIOPSY Left 1993   neg   COLONOSCOPY     MASTECTOMY PARTIAL / LUMPECTOMY       Social History:   reports that he has never smoked. He has never used smokeless tobacco. He reports that he does not currently use alcohol. He reports that he does not currently use drugs.   Family History:  His family history includes Cancer in his maternal aunt, maternal grandfather, and mother; Hypertension in his father.   Allergies No Known Allergies   Home Medications  Prior to Admission medications   Medication Sig Start Date End Date Taking? Authorizing Provider  amitriptyline (ELAVIL) 10 MG tablet Take 10 mg by mouth at bedtime.   Yes [provider]  ibuprofen (ADVIL) 200 MG tablet Take 200 mg by mouth every 6 (six) hours as needed for fever or mild pain (pain score 1-3).   Yes [provider]  pantoprazole (PROTONIX) 40 MG tablet Take 40 mg by mouth daily.   Yes [provider]  sertraline (ZOLOFT) 50 MG tablet Take 50 mg by mouth daily.   Yes [provider]     Critical care time: 40 minutes     Harlon Ditty, AGACNP-BC [facility] Prefer epic messenger for cross cover needs If after hours, please call E-link

## 2023-06-26 NOTE — Progress Notes (Signed)
PHARMACY - TOTAL PARENTERAL NUTRITION CONSULT NOTE   Indication: Ileus  Patient Measurements: Height: 5\' 10"  (177.8 cm) Weight: 96.3 kg (212 lb 4.9 oz) IBW/kg (Calculated) : 73   Body mass index is 30.46 kg/m.  Assessment:   47 y/o male with h/o depression, SI, GERD, OSA, etoh abuse, cirrhosis, HTN, breast mass s/p mastectomy (90s), asthma and tremors who is admitted from [facility] with severe alcohol withdrawal with delirium tremens and abdominal distension.   Glucose / Insulin:  --Last Hgb A1c was 5.2% in 03/2023 --BG < 180, 1u SSI last 24h Electrolytes: Hypokalemia with K 3.2 this AM. Magnesium at LLN of 1.8 Renal: Scr < 1 Hepatic: Overall normal Intake / Output: Do not have strict I&Os for the admission GI Imaging: 12/06 CT Abd Dilated loops of small bowel and colon, favoring adynamic ileus over small bowel obstruction. GI Surgeries / Procedures: none recent  Central access: 06/23/23 TPN start date: 06/24/23  RD Assessment: Estimated Needs Total Energy Estimated Needs: 2100-2400kcal/day Total Protein Estimated Needs: 105-120g/day Total Fluid Estimated Needs: 2.3-2.6L/day  Current Nutrition:  NPO  Plan:  --Clinimix E8/10 for 1L + SMOFlipid 20% 250 mL/day --Electrolytes in TPN: Na 32mEq/L, K 58mEq/L, Ca 4.66mEq/L, Mg 62mEq/L, and Phos 74mmol/L. Cl:Ac 0.49 --Add standard MVI and trace elements to TPN --Discontinue SSI, goal BG < 180 --Monitor TPN labs on Mon/Thurs, daily until stable. High risk re-feed  Tressie Ellis 06/26/2023,7:41 AM

## 2023-06-26 NOTE — Plan of Care (Signed)

## 2023-06-26 NOTE — Consult Note (Addendum)
[facility] Face-to-Face Psychiatry Consult   Reason for Consult:  Admit Referring Physician:  Dr. Trinna Post Patient Identification: Ryan Miranda MRN:  [id number] Principal Diagnosis: Delirium tremens [facility]) Diagnosis:  Principal Problem:   Delirium tremens (HCC) Active Problems:   Depression with suicidal ideation   GERD without esophagitis   Leukocytosis   Hypokalemia   Hyponatremia   Suicidal ideation   Hypomagnesemia   Hypophosphatemia   SBO (small bowel obstruction) (HCC)  Total Time spent with patient: 45 minutes  Subjective:  "binge drinking"  HPI:   Pt chart reviewed and assessed face to face. Pt is alert and oriented x4. When asked reason for admission, states "binge drinking".  Describes episodes of sobriety between episodes of binge drinking in adult life. Reports most recent episode of binge drinking had been several weeks after he had googled his name and saw a court document showing that his divorce had been finalized. Reports drinking daily, 2 pints of whiskey and couple of glasses of wine a day. He admits that prior to presentation at the emergency department he had been drinking, sent a text message of a loaded firearm to his ex-wife, and did have intention of using the firearm to end his life, but was stopped by his son. Denies use of other substances including marijuana, crack/cocaine, methamphetamines, nicotine products. Pt is tearful throughout assessment. Reports wanting his family back. He denies current suicidal, homicidal ideations. Denies current auditory visual hallucinations.   He denies history of non suicidal self injurious behavior, suicide attempt or inpatient psychiatric hospitalization.  Reports he has previously been in therapy, last attended therapy about 6 months ago, when he was connected with a psychologist in [place]. Reports co-pays were expensive and he did not find sessions to be particularly helpful. Reports he had briefly seen a psychiatrist  at the age of 47 y/o after his mother killed his father in self defense. Reports he currently takes 2 psychiatric medications which are prescribed by his pcp that he cannot recall.   Pt endorses positive family psychiatric history. Reports his father was an alcoholic and abused his mother. Reports his mother was an alcoholic. Reports his sister had "some psych issues".   Pt is currently employed at [employer] in the [employer]. Reports he has been working there for 19 years.   Discussed with pt treatment plan to include IVC as well as inpatient psychiatric admission once he is medically cleared. Pt verbalized understanding.   Past Psychiatric History: History of alcohol abuse  Risk to Self: Denies current suicidal ideations Risk to Others: Denies current homicidal ideations Prior Inpatient Therapy: Denies Prior Outpatient Therapy: Yes  Past Medical History:  Past Medical History:  Diagnosis Date   Depression    Elevated ferritin level    Elevated LFTs    Hypertension    Iron overload 02/06/2019   Laryngopharyngeal reflux (LPR)    OSA on CPAP    Tremors of nervous system    Unintentional weight loss     Past Surgical History:  Procedure Laterality Date   BREAST EXCISIONAL BIOPSY Left 1993   neg   COLONOSCOPY     MASTECTOMY PARTIAL / LUMPECTOMY     Family History:  Family History  Problem Relation Age of Onset   Cancer Mother    Hypertension Father    Cancer Maternal Aunt    Cancer Maternal Grandfather    Family Psychiatric  History: See above Social History:  Social History   Substance and Sexual Activity  Alcohol Use Not Currently   Alcohol/week: 0.0 standard drinks of alcohol     Social History   Substance and Sexual Activity  Drug Use Not Currently    Social History   Socioeconomic History   Marital status: Married    Spouse name: Not on file   Number of children: Not on file   Years of education: Not on file   Highest education level: Not on  file  Occupational History   Not on file  Tobacco Use   Smoking status: Never   Smokeless tobacco: Never  Substance and Sexual Activity   Alcohol use: Not Currently    Alcohol/week: 0.0 standard drinks of alcohol   Drug use: Not Currently   Sexual activity: Yes  Other Topics Concern   Not on file  Social History Narrative   Not on file   Social Determinants of Health   Financial Resource Strain: High Risk (03/27/2023)   Received from [facility]   Overall Financial Resource Strain (CARDIA)    Difficulty of Paying Living Expenses: Hard  Food Insecurity: No Food Insecurity (06/17/2023)   Hunger Vital Sign    Worried About Running Out of Food in the Last Year: Never true    Ran Out of Food in the Last Year: Never true  Recent Concern: Food Insecurity - Food Insecurity Present (03/27/2023)   Received from [facility]   Hunger Vital Sign    Worried About Running Out of Food in the Last Year: Sometimes true    Ran Out of Food in the Last Year: Sometimes true  Transportation Needs: Patient Unable To Answer (06/21/2023)   PRAPARE - Transportation    Lack of Transportation (Medical): Patient unable to answer    Lack of Transportation (Non-Medical): Patient unable to answer  Physical Activity: Patient Unable To Answer (03/27/2023)   Received from [facility]   Exercise Vital Sign    Days of Exercise per Week: Patient unable to answer    Minutes of Exercise per Session: Patient unable to answer  Stress: Stress Concern Present (03/27/2023)   Received from [facility]   [employer] of Occupational Health - Occupational Stress Questionnaire    Feeling of Stress : Very much  Social Connections: Unknown (03/27/2023)   Received from [facility]   Social Connection and Isolation Panel [NHANES]    Frequency of Communication with Friends and Family: Once a week    Frequency of Social Gatherings with  Friends and Family: Not on file    Attends Religious Services: Never    Database administrator or Organizations: No    Attends Banker Meetings: Never    Marital Status: Separated   Allergies:  No Known Allergies  Labs:  Results for orders placed or performed during the hospital encounter of 06/15/23 (from the past 48 hour(s))  Urinalysis, Complete w Microscopic -Urine, Clean Catch     Status: Abnormal   Collection Time: 06/24/23  6:40 PM  Result Value Ref Range   Color, Urine YELLOW YELLOW   APPearance CLOUDY (A) CLEAR   Specific Gravity, Urine >1.030 (H) 1.005 - 1.030   pH 5.5 5.0 - 8.0   Glucose, UA NEGATIVE NEGATIVE mg/dL   Hgb urine dipstick TRACE (A) NEGATIVE   Bilirubin Urine SMALL (A) NEGATIVE   Ketones, ur 15 (A) NEGATIVE mg/dL   Protein, ur 30 (A) NEGATIVE mg/dL   Nitrite NEGATIVE NEGATIVE   Leukocytes,Ua NEGATIVE  NEGATIVE   Squamous Epithelial / HPF 0-5 0 - 5 /HPF   WBC, UA 0-5 0 - 5 WBC/hpf   RBC / HPF 0-5 0 - 5 RBC/hpf   Bacteria, UA NONE SEEN NONE SEEN   Amorphous Crystal PRESENT     Comment: Performed at [facility], [address] [place], [place] [address]  Glucose, capillary     Status: Abnormal   Collection Time: 06/25/23 12:07 AM  Result Value Ref Range   Glucose-Capillary 106 (H) 70 - 99 mg/dL    Comment: Glucose reference range applies only to samples taken after fasting for at least 8 hours.  Renal function panel     Status: Abnormal   Collection Time: 06/25/23  5:07 AM  Result Value Ref Range   Sodium 135 135 - 145 mmol/L   Potassium 2.8 (L) 3.5 - 5.1 mmol/L   Chloride 95 (L) 98 - 111 mmol/L   CO2 28 22 - 32 mmol/L   Glucose, Bld 111 (H) 70 - 99 mg/dL    Comment: Glucose reference range applies only to samples taken after fasting for at least 8 hours.   BUN 6 6 - 20 mg/dL   Creatinine, Ser 9.14 0.61 - 1.24 mg/dL   Calcium 8.5 (L) 8.9 - 10.3 mg/dL   Phosphorus 3.1 2.5 - 4.6 mg/dL   Albumin 3.2 (L) 3.5 - 5.0 g/dL   GFR,  Estimated >78 >29 mL/min    Comment: (NOTE) Calculated using the CKD-EPI Creatinine Equation (2021)    Anion gap 12 5 - 15    Comment: Performed at [facility], [address] [place], [place] [address]  CBC     Status: Abnormal   Collection Time: 06/25/23  5:07 AM  Result Value Ref Range   WBC 16.5 (H) 4.0 - 10.5 K/uL   RBC 4.27 4.22 - 5.81 MIL/uL   Hemoglobin 12.7 (L) 13.0 - 17.0 g/dL   HCT 08.6 (L) 57.8 - 46.9 %   MCV 87.1 80.0 - 100.0 fL   MCH 29.7 26.0 - 34.0 pg   MCHC 34.1 30.0 - 36.0 g/dL   RDW 62.9 52.8 - 41.3 %   Platelets 413 (H) 150 - 400 K/uL   nRBC 0.0 0.0 - 0.2 %    Comment: Performed at [facility], [address], [place], [place] [address]  Magnesium     Status: None   Collection Time: 06/25/23  5:07 AM  Result Value Ref Range   Magnesium 2.1 1.7 - 2.4 mg/dL    Comment: Performed at [facility], [address] [place], [place] [address]  Triglycerides     Status: None   Collection Time: 06/25/23  5:07 AM  Result Value Ref Range   Triglycerides 119 <150 mg/dL    Comment: Performed at [facility], [address] [place], [place] [address]  Glucose, capillary     Status: None   Collection Time: 06/25/23  5:35 AM  Result Value Ref Range   Glucose-Capillary 98 70 - 99 mg/dL    Comment: Glucose reference range applies only to samples taken after fasting for at least 8 hours.  Glucose, capillary     Status: None   Collection Time: 06/25/23 11:13 AM  Result Value Ref Range   Glucose-Capillary 95 70 - 99 mg/dL    Comment: Glucose reference range applies only to samples taken after fasting for at least 8 hours.  Potassium     Status: Abnormal  Collection Time: 06/25/23  2:54 PM  Result Value Ref Range   Potassium 3.3 (L) 3.5 - 5.1 mmol/L    Comment: Performed at [facility], [address] [place], [place] [address]  Magnesium     Status: None   Collection Time: 06/25/23  2:54 PM  Result Value Ref Range    Magnesium 2.0 1.7 - 2.4 mg/dL    Comment: Performed at [facility], [address] [place], [place] [address]  Glucose, capillary     Status: Abnormal   Collection Time: 06/25/23  3:20 PM  Result Value Ref Range   Glucose-Capillary 135 (H) 70 - 99 mg/dL    Comment: Glucose reference range applies only to samples taken after fasting for at least 8 hours.  Gastrointestinal Panel by PCR , Stool     Status: None   Collection Time: 06/25/23  7:20 PM  Result Value Ref Range   Campylobacter species NOT DETECTED NOT DETECTED   Plesimonas shigelloides NOT DETECTED NOT DETECTED   Salmonella species NOT DETECTED NOT DETECTED   Yersinia enterocolitica NOT DETECTED NOT DETECTED   Vibrio species NOT DETECTED NOT DETECTED   Vibrio cholerae NOT DETECTED NOT DETECTED   Enteroaggregative E coli (EAEC) NOT DETECTED NOT DETECTED   Enteropathogenic E coli (EPEC) NOT DETECTED NOT DETECTED   Enterotoxigenic E coli (ETEC) NOT DETECTED NOT DETECTED   Shiga like toxin producing E coli (STEC) NOT DETECTED NOT DETECTED   Shigella/Enteroinvasive E coli (EIEC) NOT DETECTED NOT DETECTED   Cryptosporidium NOT DETECTED NOT DETECTED   Cyclospora cayetanensis NOT DETECTED NOT DETECTED   Entamoeba histolytica NOT DETECTED NOT DETECTED   Giardia lamblia NOT DETECTED NOT DETECTED   Adenovirus F40/41 NOT DETECTED NOT DETECTED   Astrovirus NOT DETECTED NOT DETECTED   Norovirus GI/GII NOT DETECTED NOT DETECTED   Rotavirus A NOT DETECTED NOT DETECTED   Sapovirus (I, II, IV, and V) NOT DETECTED NOT DETECTED    Comment: Performed at [facility], [address] [place], [place] [address]  Glucose, capillary     Status: None   Collection Time: 06/25/23 11:13 PM  Result Value Ref Range   Glucose-Capillary 98 70 - 99 mg/dL    Comment: Glucose reference range applies only to samples taken after fasting for at least 8 hours.  Magnesium     Status: None   Collection Time: 06/26/23  4:25 AM  Result  Value Ref Range   Magnesium 1.8 1.7 - 2.4 mg/dL    Comment: Performed at [facility], [address] [place], [place] [address]  Basic metabolic panel     Status: Abnormal   Collection Time: 06/26/23  4:25 AM  Result Value Ref Range   Sodium 135 135 - 145 mmol/L   Potassium 3.2 (L) 3.5 - 5.1 mmol/L   Chloride 99 98 - 111 mmol/L   CO2 29 22 - 32 mmol/L   Glucose, Bld 111 (H) 70 - 99 mg/dL    Comment: Glucose reference range applies only to samples taken after fasting for at least 8 hours.   BUN 7 6 - 20 mg/dL   Creatinine, Ser 9.52 0.61 - 1.24 mg/dL   Calcium 8.7 (L) 8.9 - 10.3 mg/dL   GFR, Estimated >84 >13 mL/min    Comment: (NOTE) Calculated using the CKD-EPI Creatinine Equation (2021)    Anion gap 7 5 - 15    Comment: Performed at [facility], [address], [place], [place] [address]  Phosphorus  Status: None   Collection Time: 06/26/23  4:25 AM  Result Value Ref Range   Phosphorus 2.7 2.5 - 4.6 mg/dL    Comment: Performed at [facility], [address] [place], [place] [address]  CBC     Status: Abnormal   Collection Time: 06/26/23  4:25 AM  Result Value Ref Range   WBC 15.3 (H) 4.0 - 10.5 K/uL   RBC 4.06 (L) 4.22 - 5.81 MIL/uL   Hemoglobin 11.9 (L) 13.0 - 17.0 g/dL   HCT 60.4 (L) 54.0 - 98.1 %   MCV 87.7 80.0 - 100.0 fL   MCH 29.3 26.0 - 34.0 pg   MCHC 33.4 30.0 - 36.0 g/dL   RDW 19.1 47.8 - 29.5 %   Platelets 520 (H) 150 - 400 K/uL   nRBC 0.0 0.0 - 0.2 %    Comment: Performed at [facility], [address] [place], [place] [address]  Glucose, capillary     Status: None   Collection Time: 06/26/23  8:46 AM  Result Value Ref Range   Glucose-Capillary 92 70 - 99 mg/dL    Comment: Glucose reference range applies only to samples taken after fasting for at least 8 hours.    Current Facility-Administered Medications  Medication Dose Route Frequency Provider Last Rate Last Admin   acetaminophen (TYLENOL) tablet 650 mg   650 mg Oral Q6H PRN Mansy, Jan A, MD   650 mg at 06/25/23 8657   Or   acetaminophen (TYLENOL) suppository 650 mg  650 mg Rectal Q6H PRN Mansy, Jan A, MD   650 mg at 06/23/23 0211   Chlorhexidine Gluconate Cloth 2 % PADS 6 each  6 each Topical Daily Judithe Modest, NP       enoxaparin (LOVENOX) injection 40 mg  40 mg Subcutaneous Q24H Mansy, Jan A, MD   40 mg at 06/25/23 1111   insulin aspart (novoLOG) injection 0-9 Units  0-9 Units Subcutaneous Q8H Tressie Ellis, RPH   1 Units at 06/25/23 1623   magnesium hydroxide (MILK OF MAGNESIA) suspension 30 mL  30 mL Oral Daily PRN Mansy, Jan A, MD       magnesium sulfate IVPB 2 g 50 mL  2 g Intravenous Once Tressie Ellis, RPH       OLANZapine (ZYPREXA) injection 10 mg  10 mg Intramuscular Once Harlon Ditty D, NP       OLANZapine zydis (ZYPREXA) disintegrating tablet 10 mg  10 mg Oral Q8H PRN Sindy Guadeloupe, NP   10 mg at 06/25/23 0416   ondansetron (ZOFRAN) tablet 4 mg  4 mg Oral Q6H PRN Mansy, Jan A, MD       Or   ondansetron [facility]) injection 4 mg  4 mg Intravenous Q6H PRN Mansy, Jan A, MD   4 mg at 06/25/23 0531   Oral care mouth rinse  15 mL Mouth Rinse PRN Alford Highland, MD       pantoprazole (PROTONIX) injection 40 mg  40 mg Intravenous Q24H Rust-Chester, Britton L, NP   40 mg at 06/25/23 2106   phenol (CHLORASEPTIC) mouth spray 1 spray  1 spray Mouth/Throat PRN Rust-Chester, Cecelia Byars, NP   1 spray at 06/26/23 0349   piperacillin-tazobactam (ZOSYN) IVPB 3.375 g  3.375 g Intravenous Zollie Beckers, NP 12.5 mL/hr at 06/26/23 0604 Infusion Verify at 06/26/23 0604   potassium chloride 10 mEq in 100 mL IVPB  10 mEq Intravenous Q1 Hr x 4 Rust-Chester, Cecelia Byars, NP  100 mL/hr at 06/26/23 0703 10 mEq at 06/26/23 0703   QUEtiapine (SEROQUEL) tablet 50 mg  50 mg Per Tube BID Assaker, [place] Bali, MD   50 mg at 06/25/23 2109   sertraline (ZOLOFT) tablet 50 mg  50 mg Per Tube Daily Assaker, [place] Bali, MD       sodium chloride flush (NS)  0.9 % injection 10-40 mL  10-40 mL Intracatheter Q12H Kasa, Kurian, MD   10 mL at 06/25/23 2109   sodium chloride flush (NS) 0.9 % injection 10-40 mL  10-40 mL Intracatheter PRN Erin Fulling, MD       thiamine (VITAMIN B1) injection 100 mg  100 mg Intravenous Daily Harlon Ditty D, NP   100 mg at 06/25/23 1113   TPN (CLINIMIX-E) Adult   Intravenous Continuous TPN Tressie Ellis, RPH 42 mL/hr at 06/26/23 0604 Infusion Verify at 06/26/23 0604   traZODone (DESYREL) tablet 25 mg  25 mg Per Tube QHS PRN Tressie Ellis, [facility]       Musculoskeletal: Strength & Muscle Tone:  pt lying down on assessment Gait & Station:  pt lying down on assessment Patient leans:  pt lying down on assessment  Psychiatric Specialty Exam:  Presentation  General Appearance:  Appropriate for Environment  Eye Contact: Fair  Speech: Clear and Coherent; Slow  Speech Volume: Decreased  Handedness: Right   Mood and Affect  Mood: Depressed  Affect: Flat; Tearful   Thought Process  Thought Processes: Coherent; Goal Directed; Linear  Descriptions of Associations:Intact  Orientation:Full (Time, Place and Person)  Thought Content:Logical  History of Schizophrenia/Schizoaffective disorder:No  Hallucinations:Hallucinations: None  Ideas of Reference:None  Suicidal Thoughts:Suicidal Thoughts: No  Homicidal Thoughts:Homicidal Thoughts: No   Sensorium  Memory: Immediate Fair  Judgment: Intact  Insight: Good   Executive Functions  Concentration: Good  Attention Span: Good  Recall: Good  Fund of Knowledge: Good  Language: Good   Psychomotor Activity  Psychomotor Activity: Psychomotor Activity: Other (comment) (lying down in bed)   Assets  Assets: Communication Skills; Desire for Improvement; Financial Resources/Insurance; Housing; Leisure Time; Resilience; Vocational/Educational   Sleep  Sleep: Sleep: Poor   Physical Exam: Physical Exam Constitutional:       General: He is not in acute distress. Neurological:     Mental Status: He is alert and oriented to person, place, and time.  Psychiatric:        Attention and Perception: Attention and perception normal.        Mood and Affect: Mood is depressed. Affect is flat and tearful.        Behavior: Behavior is slowed. Behavior is cooperative.        Thought Content: Thought content normal.    Review of Systems  Constitutional:  Negative for fever.  Respiratory:  Negative for shortness of breath.   Psychiatric/Behavioral:  Positive for depression, substance abuse and suicidal ideas. The patient has insomnia.    Blood pressure 128/76, pulse (!) 106, temperature 98.4 F (36.9 C), temperature source Oral, resp. rate 16, height 5\' 10"  (1.778 m), weight 96.3 kg, SpO2 100%. Body mass index is 30.46 kg/m.  Treatment Plan Summary: Plan    47 y/o male w/ history of alcohol abuse admitted to ICU for management of delirium tremens. Pt currently under IVC and recommended for inpatient psychiatric admission once medically cleared. Have asked primary team to inform psychiatry once pt is medically cleared.   Disposition: Recommend psychiatric Inpatient admission when medically cleared. Supportive therapy provided about ongoing stressors.  Lauree Chandler, NP 06/26/2023 8:48 AM

## 2023-06-27 DIAGNOSIS — F10931 Alcohol use, unspecified with withdrawal delirium: Secondary | ICD-10-CM | POA: Diagnosis not present

## 2023-06-27 DIAGNOSIS — R45851 Suicidal ideations: Secondary | ICD-10-CM | POA: Diagnosis not present

## 2023-06-27 DIAGNOSIS — D72829 Elevated white blood cell count, unspecified: Secondary | ICD-10-CM | POA: Diagnosis not present

## 2023-06-27 DIAGNOSIS — R5081 Fever presenting with conditions classified elsewhere: Secondary | ICD-10-CM | POA: Diagnosis not present

## 2023-06-27 DIAGNOSIS — E876 Hypokalemia: Secondary | ICD-10-CM | POA: Diagnosis not present

## 2023-06-27 LAB — BASIC METABOLIC PANEL
Anion gap: 7 (ref 5–15)
BUN: 8 mg/dL (ref 6–20)
CO2: 27 mmol/L (ref 22–32)
Calcium: 8.7 mg/dL — ABNORMAL LOW (ref 8.9–10.3)
Chloride: 99 mmol/L (ref 98–111)
Creatinine, Ser: 0.69 mg/dL (ref 0.61–1.24)
GFR, Estimated: >60 mL/min (ref >60)
Glucose, Bld: 122 mg/dL — ABNORMAL HIGH (ref 70–99)
Potassium: 3.8 mmol/L (ref 3.5–5.1)
Sodium: 133 mmol/L — ABNORMAL LOW (ref 135–145)

## 2023-06-27 LAB — CBC
HCT: 37.4 % — ABNORMAL LOW (ref 39.0–52.0)
Hemoglobin: 12.4 g/dL — ABNORMAL LOW (ref 13.0–17.0)
MCH: 30 pg (ref 26.0–34.0)
MCHC: 33.2 g/dL (ref 30.0–36.0)
MCV: 90.3 fL (ref 80.0–100.0)
Platelets: 658 10*3/uL — ABNORMAL HIGH (ref 150–400)
RBC: 4.14 MIL/uL — ABNORMAL LOW (ref 4.22–5.81)
RDW: 14.1 % (ref 11.5–15.5)
WBC: 14.2 10*3/uL — ABNORMAL HIGH (ref 4.0–10.5)
nRBC: 0 % (ref 0.0–0.2)

## 2023-06-27 LAB — PHOSPHORUS: Phosphorus: 3.7 mg/dL (ref 2.5–4.6)

## 2023-06-27 LAB — MAGNESIUM: Magnesium: 2.2 mg/dL (ref 1.7–2.4)

## 2023-06-27 LAB — GLUCOSE, CAPILLARY: Glucose-Capillary: 112 mg/dL — ABNORMAL HIGH (ref 70–99)

## 2023-06-27 MED ORDER — TRAZODONE HCL 50 MG PO TABS
25.0000 mg | ORAL_TABLET | Freq: Every evening | ORAL | Status: DC | PRN
  Administered 2023-06-28: 25 mg via ORAL
  Filled 2023-06-27: qty 1

## 2023-06-27 MED ORDER — ADULT MULTIVITAMIN W/MINERALS CH
1.0000 | ORAL_TABLET | Freq: Every day | ORAL | Status: DC
Start: 2023-06-28 — End: 2023-06-30
  Administered 2023-06-28 – 2023-06-30 (×3): 1 via ORAL
  Filled 2023-06-27 (×3): qty 1

## 2023-06-27 MED ORDER — BENZONATATE 100 MG PO CAPS
200.0000 mg | ORAL_CAPSULE | Freq: Three times a day (TID) | ORAL | Status: DC | PRN
  Administered 2023-06-27: 200 mg via ORAL
  Filled 2023-06-27: qty 2

## 2023-06-27 MED ORDER — SERTRALINE HCL 50 MG PO TABS
50.0000 mg | ORAL_TABLET | Freq: Every day | ORAL | Status: DC
  Administered 2023-06-28 – 2023-06-30 (×3): 50 mg via ORAL
  Filled 2023-06-27 (×3): qty 1

## 2023-06-27 MED ORDER — ENSURE ENLIVE PO LIQD
237.0000 mL | Freq: Three times a day (TID) | ORAL | Status: DC
  Administered 2023-06-27 – 2023-06-30 (×8): 237 mL via ORAL

## 2023-06-27 MED ORDER — TRACE MINERALS CU-MN-SE-ZN 300-55-60-3000 MCG/ML IV SOLN: INTRAVENOUS | Status: DC

## 2023-06-27 MED ORDER — PANTOPRAZOLE SODIUM 40 MG PO TBEC
40.0000 mg | DELAYED_RELEASE_TABLET | Freq: Every day | ORAL | Status: DC
Start: 2023-06-27 — End: 2023-06-30
  Administered 2023-06-27 – 2023-06-29 (×3): 40 mg via ORAL
  Filled 2023-06-27 (×3): qty 1

## 2023-06-27 MED ORDER — FAT EMUL FISH OIL/PLANT BASED 20% (SMOFLIPID)IV EMUL: 250.0000 mL | INTRAVENOUS | Status: DC

## 2023-06-27 MED ORDER — THIAMINE MONONITRATE 100 MG PO TABS
100.0000 mg | ORAL_TABLET | Freq: Every day | ORAL | Status: DC
  Administered 2023-06-27 – 2023-06-30 (×4): 100 mg via ORAL
  Filled 2023-06-27 (×4): qty 1

## 2023-06-27 MED ORDER — QUETIAPINE FUMARATE 25 MG PO TABS
50.0000 mg | ORAL_TABLET | Freq: Two times a day (BID) | ORAL | Status: DC
  Administered 2023-06-27 – 2023-06-30 (×6): 50 mg via ORAL
  Filled 2023-06-27 (×6): qty 2

## 2023-06-27 NOTE — Consult Note (Signed)
Pt briefly seen at bedside. Pt reports feeling "really tired" today. Denies suicidal, homicidal ideation. Denies auditory visual hallucinations or paranoia. Reviewed plan for inpatient psychiatric admission once medically cleared. Pt verbalized understanding. Pt not currently medically cleared.

## 2023-06-27 NOTE — Progress Notes (Signed)
Date of Admission:  06/15/2023     ID: Ryan Miranda is a 47 y.o. male Principal Problem:   Delirium tremens (HCC) Active Problems:   Depression with suicidal ideation   GERD without esophagitis   Leukocytosis   Hypokalemia   Hyponatremia   Suicidal ideation   Hypomagnesemia   Hypophosphatemia   SBO (small bowel obstruction) (HCC)   Fever    Subjective: Doing much better Ambulating in corridor No abdominal pain except rt upper quadrant Stools now slightly firming up Eating   Medications:   Chlorhexidine Gluconate Cloth  6 each Topical Daily   enoxaparin (LOVENOX) injection  40 mg Subcutaneous Q24H   feeding supplement  237 mL Oral TID BM   [START ON 06/28/2023] multivitamin with minerals  1 tablet Oral Daily   pantoprazole  40 mg Oral QHS   QUEtiapine  50 mg Oral BID   [START ON 06/28/2023] sertraline  50 mg Oral Daily   sodium chloride flush  10-40 mL Intracatheter Q12H   thiamine  100 mg Oral Daily    Objective: Vital signs in last 24 hours: Patient Vitals for the past 24 hrs:  BP Temp Temp src Pulse Resp SpO2 Weight  06/27/23 1600 (!) 130/118 97.7 F (36.5 C) Oral -- 20 96 % --  06/27/23 1530 110/78 -- -- (!) 135 17 95 % --  06/27/23 1200 (!) 119/93 98.2 F (36.8 C) Oral (!) 118 19 98 % --  06/27/23 1105 108/67 -- -- (!) 107 20 100 % --  06/27/23 1000 100/65 -- -- 92 12 97 % --  06/27/23 0800 (!) 110/97 98.2 F (36.8 C) Oral 93 (!) 21 100 % --  06/27/23 0600 93/64 -- -- 83 12 99 % --  06/27/23 0500 -- -- -- 100 -- 98 % --  06/27/23 0400 94/76 97.7 F (36.5 C) Oral 90 15 100 % 95.3 kg  06/27/23 0300 -- -- -- 89 -- 95 % --  06/27/23 0200 (!) 98/50 -- -- (!) 101 -- 96 % --  06/27/23 0100 -- -- -- (!) 105 -- 95 % --  06/27/23 0000 132/87 98.8 F (37.1 C) Oral (!) 103 16 98 % --  06/26/23 2318 -- -- -- (!) 105 -- 97 % --  06/26/23 2300 -- -- -- (!) 107 -- 96 % --  06/26/23 2200 -- -- -- (!) 106 16 96 % --  06/26/23 2135 105/72 -- -- (!) 107  13 93 % --  06/26/23 2100 -- -- -- (!) 107 16 97 % --  06/26/23 2000 105/72 98.4 F (36.9 C) Oral (!) 107 15 96 % --  06/26/23 1900 -- -- -- (!) 113 15 94 % --      PHYSICAL EXAM:  General: Alert, cooperative, no distress, .  Pt is ambulating  Lab Results    Latest Ref Rng & Units 06/27/2023    4:15 AM 06/26/2023    4:25 AM 06/25/2023    5:07 AM  CBC  WBC 4.0 - 10.5 K/uL 14.2  15.3  16.5   Hemoglobin 13.0 - 17.0 g/dL 44.0  10.2  72.5   Hematocrit 39.0 - 52.0 % 37.4  35.6  37.2   Platelets 150 - 400 K/uL 658  520  413        Latest Ref Rng & Units 06/27/2023    4:15 AM 06/26/2023    4:25 AM 06/25/2023    2:54 PM  CMP  Glucose 70 - 99 mg/dL  122  111    BUN 6 - 20 mg/dL 8  7    Creatinine 8.84 - 1.24 mg/dL 1.66  0.63    Sodium 016 - 145 mmol/L 133  135    Potassium 3.5 - 5.1 mmol/L 3.8  3.2  3.3   Chloride 98 - 111 mmol/L 99  99    CO2 22 - 32 mmol/L 27  29    Calcium 8.9 - 10.3 mg/dL 8.7  8.7        Microbiology: [facility] NG Studies/Results: DG Abd 1 View  Result Date: 06/26/2023 CLINICAL DATA:  [id number] with acute hypoxic respiratory failure, [id number] with ileus. EXAM: ABDOMEN - 1 VIEW; PORTABLE CHEST - 1 VIEW COMPARISON:  Portable chest 06/24/2023, CT abdomen pelvis 06/22/2023. FINDINGS: Chest AP portable 4:08 a.m. NGT loops around the stomach to the left with the tip in the proximal lumen right of the midline. Right PICC has been pulled back into the upper SVC, previously terminating at about the superior cavoatrial junction. There is linear scarring or atelectasis in the medial right lower lung field. The lungs are otherwise clear. The sulci are sharp. The cardiomediastinal silhouette and vascular pattern are normal. No new osseous abnormality. Multiple overlying monitor wires. Flat plate abdomen 3:55 a.m.: Small-bowel dilatation is little changed today, with central abdominal small bowel segments measuring up to 5.6 cm. There is aeration in the ascending and transverse colon  without pathologic dilatation or wall edema. There is a paucity of bowel aeration in the pelvis. There is no supine evidence for free air. Pelvic phleboliths noted without pathologic calcification. IMPRESSION: 1. No acute chest findings. 2. Right PICC has been pulled back into the upper SVC, previously terminating at about the superior cavoatrial junction. 3. NGT loops around the stomach to the left with the tip in the proximal lumen right of the midline. 4. Small-bowel dilatation is little changed today, with central abdominal small bowel segments measuring up to 5.6 cm. Electronically Signed   By: Almira Bar M.D.   On: 06/26/2023 06:20   DG Chest Port 1 View  Result Date: 06/26/2023 CLINICAL DATA:  [id number] with acute hypoxic respiratory failure, 98749 with ileus. EXAM: ABDOMEN - 1 VIEW; PORTABLE CHEST - 1 VIEW COMPARISON:  Portable chest 06/24/2023, CT abdomen pelvis 06/22/2023. FINDINGS: Chest AP portable 4:08 a.m. NGT loops around the stomach to the left with the tip in the proximal lumen right of the midline. Right PICC has been pulled back into the upper SVC, previously terminating at about the superior cavoatrial junction. There is linear scarring or atelectasis in the medial right lower lung field. The lungs are otherwise clear. The sulci are sharp. The cardiomediastinal silhouette and vascular pattern are normal. No new osseous abnormality. Multiple overlying monitor wires. Flat plate abdomen 5:42 a.m.: Small-bowel dilatation is little changed today, with central abdominal small bowel segments measuring up to 5.6 cm. There is aeration in the ascending and transverse colon without pathologic dilatation or wall edema. There is a paucity of bowel aeration in the pelvis. There is no supine evidence for free air. Pelvic phleboliths noted without pathologic calcification. IMPRESSION: 1. No acute chest findings. 2. Right PICC has been pulled back into the upper SVC, previously terminating at about the  superior cavoatrial junction. 3. NGT loops around the stomach to the left with the tip in the proximal lumen right of the midline. 4. Small-bowel dilatation is little changed today, with central abdominal small bowel segments measuring up to 5.6 cm. Electronically  Signed   By: Almira Bar M.D.   On: 06/26/2023 06:20     Assessment/Plan: Pt with ETOH abuse presenting with suicidal ideation   Encephalopathy improved   Alcohol withdrawal/ DT needing precedex , phenobarbitol Fluctuating delirium resolved   New fever could be from agitation - has resolved NO aspiration, blood culture neg, no UTI Will stop zosyn     Pt has intermittent diarrhea- Has small intestinal dilatation Usually cdiff involves colon Currently no convincing evidence of Clostridium- monitor closely, low threshold for testing    Hypokalemia-improved   Hepatic steatosis   Mild leucocytosis    Discussed with patient and his nurse

## 2023-06-27 NOTE — Plan of Care (Signed)

## 2023-06-27 NOTE — Progress Notes (Signed)
Physical Therapy Treatment Patient Details Name: Ryan Miranda MRN: [id number] DOB: 10/16/1975 Today's Date: 06/27/2023   History of Present Illness Ryan Miranda is a 47 y.o. male with medical history significant for essential hypertension, depression, OSA on CPAP and alcohol abuse, presented to the emergency room with acute onset of suicidal ideation and on 11/29 which have been recurrent lately and got worse over the last 3 days. During his ER stay he became significantly anxious and tachycardic and hypertensive, developed alcohol withdrawal with hallucination and delirium which required admission.    PT Comments  Pt was sitting in recliner upon arrival. Supportive family member at bedside. Resting HR in 130-140s BPM. Cleared to participate in PT at this time. Pt is alert and agreeable. Easily and safely able to ambulate 2 laps in units with progressively less UE support. Last 200 ft without UE support. PT is progressing well. DC recs remain appropriate.      If plan is discharge home, recommend the following: Assist for transportation;Help with stairs or ramp for entrance;Assistance with cooking/housework;A little help with walking and/or transfers;A little help with bathing/dressing/bathroom     Equipment Recommendations  None recommended by PT       Precautions / Restrictions Precautions Precautions: Fall Restrictions Weight Bearing Restrictions: No     Mobility  Bed Mobility  General bed mobility comments: In recliner pre/post session. HR elevated throughout. cleared by RN/MD to ambulate even with elevated HR.    Transfers Overall transfer level: Needs assistance Equipment used: Rolling walker (2 wheels) Transfers: Sit to/from Stand Sit to Stand: Supervision    Ambulation/Gait Ambulation/Gait assistance: Supervision Gait Distance (Feet): 400 Feet Assistive device: Rolling walker (2 wheels), IV Pole, None Gait Pattern/deviations: Step-through  pattern  General Gait Details: Pt started ambulation with IV pole then progressed to HHA +1, eventually ambulated ~ 200 ft without AD or LOB. HR in 150s but pt assymptomatic and cleared to ambulate even with elevated HR.    Balance Overall balance assessment: Needs assistance Sitting-balance support: No upper extremity supported, Feet supported Sitting balance-Leahy Scale: Normal     Standing balance support: No upper extremity supported Standing balance-Leahy Scale: Fair Standing balance comment: staggers with head turns during gait without AD    Cognition Arousal: Alert Behavior During Therapy: WFL for tasks assessed/performed Overall Cognitive Status: Within Functional Limits for tasks assessed              Pertinent Vitals/Pain Pain Assessment Pain Assessment: No/denies pain Pain Score: 0-No pain     PT Goals (current goals can now be found in the care plan section) Acute Rehab PT Goals Patient Stated Goal: get home eventually but know I have to go downstair first Progress towards PT goals: Progressing toward goals    Frequency    Min 1X/week       AM-PAC PT "6 Clicks" Mobility   Outcome Measure  Help needed turning from your back to your side while in a flat bed without using bedrails?: None Help needed moving from lying on your back to sitting on the side of a flat bed without using bedrails?: None Help needed moving to and from a bed to a chair (including a wheelchair)?: A Little Help needed standing up from a chair using your arms (e.g., wheelchair or bedside chair)?: A Little Help needed to walk in hospital room?: A Little Help needed climbing 3-5 steps with a railing? : A Little 6 Click Score: 20    End of Session  Activity Tolerance: Patient tolerated treatment well Patient left: in chair;with call bell/phone within reach;with family/visitor present;with nursing/sitter in room Nurse Communication: Mobility status PT Visit Diagnosis: Muscle weakness  (generalized) (M62.81);Difficulty in walking, not elsewhere classified (R26.2);Pain     Time: 4098-1191 PT Time Calculation (min) (ACUTE ONLY): 10 min  Charges:    $Gait Training: 8-22 mins PT General Charges $$ ACUTE PT VISIT: 1 Visit                    Jetta Lout PTA 06/27/23, 5:19 PM

## 2023-06-27 NOTE — Progress Notes (Signed)
Brief Nutrition Follow-Up Note  47 y/o male with h/o depression, SI, GERD, OSA, etoh abuse, cirrhosis, HTN, breast mass s/p mastectomy (90s), asthma and tremors who is admitted from [facility] with severe alcohol withdrawal with delirium tremens and abdominal distension.   12/6- s/p fluoroscopy guided NGT placement (gastric) 12/11- NGT d/c, advanced to soft diet  Reviewed I/O's: +30 ml x 24 hours and +4.6 L since admission  UOP: 2.3 L x 24 hours  Pt advanced to soft diet with good intake today; noted meal completion 90%.   Pt currently receiving TPN Clinimix E 8/10 @ 42 ml/hr and 250 ml 20% SMOFLIPID daily. Regimen provides 1164 kcals and 80 grams protein daily, meeting 55% of estimated kcal needs and 76% of estimated protein needs. Plan to d/c TPN after bag runs out today.   RD will continue to follow pt during hospital stay and revise plan of care pending progress.   INTERVENTION:  -TPN management pharmacy; to d/c today -Continue soft diet -MVI with minerals daily -Ensure Enlive po TID, each supplement provides 350 kcal and 20 grams of protein -100 mg thiamine daily   Medications reviewed and include lovenox, protonix, and thiamine.   Labs reviewed: Na: 133, CBGS: 92-126 (inpatient orders for glycemic control are none).    Levada Schilling, RD, LDN, CDCES Registered Dietitian III Certified Diabetes Care and Education Specialist If unable to reach this RD, please use "RD Inpatient" group chat on secure chat between hours of 8am-4 pm daily

## 2023-06-27 NOTE — Progress Notes (Signed)
PROGRESS NOTE    Ryan Miranda  ZOX:[id number] DOB: Mar 19, 1976 DOA: 06/15/2023 PCP: Danella Penton, MD    Assessment & Plan:   Principal Problem:   Delirium tremens [facility]) Active Problems:   Hypokalemia   Hypomagnesemia   Hypophosphatemia   Leukocytosis   Hyponatremia   Depression with suicidal ideation   GERD without esophagitis   Suicidal ideation   SBO (small bowel obstruction) (HCC)   Fever  Assessment and Plan: Severe Alcohol Withdrawal with Delirium Tremens: much improved. Continue on thiamine, folic acid, MVI. Continue w/ sitter & IVC. Continue w/ seroquel. Completed phenobarbital taper. Had suicidial ideation. Meets inpatient psych criteria as per psych    Hypotension: resolved  Hypokalemia: WNL today  Hyponatremia:  likely secondary to beer potomania. Labile. Will continue to monitor  Hypomagnesemia: WNL today   Ileus: resolving. Now w/ multiple BMs. D/c NG tube 06/27/23. Advanced diet to soft diet.    Leukocytosis: trending down today. Will continue to monitor. CXR was neg for pneumonia.              DVT prophylaxis: lovenox Code Status: full  Family Communication:  Disposition Plan: likely d/c inpatient psych   Status is: Inpatient Remains inpatient appropriate because: severity of illness    Level of care: Progressive Consultants:  ICU Psych   Procedures:   Antimicrobials:   Subjective: Pt c/o cough   Objective: Vitals:   06/27/23 0400 06/27/23 0500 06/27/23 0600 06/27/23 0800  BP: 94/76  93/64 (!) 110/97  Pulse: 90 100 83 93  Resp: 15  12 (!) 21  Temp: 97.7 F (36.5 C)     TempSrc: Oral     SpO2: 100% 98% 99% 100%  Weight: 95.3 kg     Height:        Intake/Output Summary (Last 24 hours) at 06/27/2023 0832 Last data filed at 06/27/2023 0800 Gross per 24 hour  Intake 2360.7 ml  Output 2600 ml  Net -239.3 ml   Filed Weights   06/25/23 0500 06/26/23 0345 06/27/23 0400  Weight: 99.7 kg 96.3 kg 95.3 kg     Examination:  General exam: Appears calm and comfortable  Respiratory system: Clear to auscultation. Respiratory effort normal. Cardiovascular system: S1 & S2 +. No  rubs, gallops or clicks. Gastrointestinal system: Abdomen is nondistended, soft and nontender.  Normal bowel sounds heard. Central nervous system: Alert and oriented. Moves all extremities  Psychiatry: Judgement and insight appear normal. Flat mood and affect.     Data Reviewed: I have personally reviewed following labs and imaging studies  CBC: Recent Labs  Lab 06/23/23 0441 06/24/23 0538 06/25/23 0507 06/26/23 0425 06/27/23 0415  WBC 11.4* 14.6* 16.5* 15.3* 14.2*  HGB 11.3* 11.8* 12.7* 11.9* 12.4*  HCT 32.5* 34.7* 37.2* 35.6* 37.4*  MCV 86.2 87.4 87.1 87.7 90.3  PLT 277 350 413* 520* 658*   Basic Metabolic Panel: Recent Labs  Lab 06/23/23 2127 06/24/23 0532 06/24/23 0538 06/25/23 0507 06/25/23 1454 06/26/23 0425 06/27/23 0415  NA 136 134*  --  135  --  135 133*  K 3.1* 3.8  --  2.8* 3.3* 3.2* 3.8  CL 97* 96*  --  95*  --  99 99  CO2 28 25  --  28  --  29 27  GLUCOSE 104* 92  --  111*  --  111* 122*  BUN 8 7  --  6  --  7 8  CREATININE 0.76 0.83  --  0.72  --  0.65 0.69  CALCIUM 8.4* 8.2*  --  8.5*  --  8.7* 8.7*  MG 1.9  --  2.4 2.1 2.0 1.8 2.2  PHOS 3.4 2.7  --  3.1  --  2.7 3.7   GFR: Estimated Creatinine Clearance: 129.4 mL/min (by C-G formula based on SCr of 0.69 mg/dL). Liver Function Tests: Recent Labs  Lab 06/21/23 0455 06/22/23 0358 06/23/23 0441 06/24/23 0532 06/25/23 0507  AST 50* 37  --   --   --   ALT 30 26  --   --   --   ALKPHOS 72 76  --   --   --   BILITOT 1.5* 1.2*  --   --   --   PROT 6.4* 6.8  --   --   --   ALBUMIN 3.3*  3.3* 3.3*  3.4* 3.0* 2.9* 3.2*   Recent Labs  Lab 06/22/23 0358  LIPASE 35   Recent Labs  Lab 06/21/23 0455  AMMONIA 35   Coagulation Profile: No results for input(s): "INR", "PROTIME" in the last 168 hours. Cardiac Enzymes: No  results for input(s): "CKTOTAL", "CKMB", "CKMBINDEX", "TROPONINI" in the last 168 hours. BNP (last 3 results) No results for input(s): "PROBNP" in the last 8760 hours. HbA1C: No results for input(s): "HGBA1C" in the last 72 hours. CBG: Recent Labs  Lab 06/25/23 2313 06/26/23 0846 06/26/23 1617 06/26/23 2312 06/27/23 0728  GLUCAP 98 92 110* 126* 112*   Lipid Profile: Recent Labs    06/25/23 0507  TRIG 119   Thyroid Function Tests: No results for input(s): "TSH", "T4TOTAL", "FREET4", "T3FREE", "THYROIDAB" in the last 72 hours. Anemia Panel: No results for input(s): "VITAMINB12", "FOLATE", "FERRITIN", "TIBC", "IRON", "RETICCTPCT" in the last 72 hours. Sepsis Labs: Recent Labs  Lab 06/20/23 1216 06/24/23 0538  PROCALCITON 0.18 0.43    Recent Results (from the past 240 hour(s))  MRSA Next Gen by PCR, Nasal     Status: None   Collection Time: 06/17/23  9:30 AM   Specimen: Nasal Mucosa; Nasal Swab  Result Value Ref Range Status   MRSA by PCR Next Gen NOT DETECTED NOT DETECTED Final    Comment: (NOTE) The GeneXpert MRSA Assay (FDA approved for NASAL specimens only), is one component of a comprehensive MRSA colonization surveillance program. It is not intended to diagnose MRSA infection nor to guide or monitor treatment for MRSA infections. Test performance is not FDA approved in patients less than 24 years old. Performed at [facility], [address] [place], [place] [address]   Culture, blood (Routine X 2) w Reflex to ID Panel     Status: None (Preliminary result)   Collection Time: 06/24/23  5:38 AM   Specimen: BLOOD  Result Value Ref Range Status   Specimen Description BLOOD BLOOD LEFT ARM  Final   Special Requests   Final    BOTTLES DRAWN AEROBIC AND ANAEROBIC Blood Culture adequate volume   Culture   Final    NO GROWTH 3 DAYS Performed at [facility], [address] [place], [place] [address]    Report Status PENDING  Incomplete   Culture, blood (Routine X 2) w Reflex to ID Panel     Status: None (Preliminary result)   Collection Time: 06/24/23  5:40 AM   Specimen: BLOOD  Result Value Ref Range Status   Specimen Description BLOOD BLOOD LEFT HAND  Final   Special Requests   Final    BOTTLES DRAWN AEROBIC AND ANAEROBIC Blood  Culture adequate volume   Culture   Final    NO GROWTH 3 DAYS Performed at [facility], [address] [place], [place] [address]    Report Status PENDING  Incomplete  Gastrointestinal Panel by PCR , Stool     Status: None   Collection Time: 06/25/23  7:20 PM  Result Value Ref Range Status   Campylobacter species NOT DETECTED NOT DETECTED Final   Plesimonas shigelloides NOT DETECTED NOT DETECTED Final   Salmonella species NOT DETECTED NOT DETECTED Final   Yersinia enterocolitica NOT DETECTED NOT DETECTED Final   Vibrio species NOT DETECTED NOT DETECTED Final   Vibrio cholerae NOT DETECTED NOT DETECTED Final   Enteroaggregative E coli (EAEC) NOT DETECTED NOT DETECTED Final   Enteropathogenic E coli (EPEC) NOT DETECTED NOT DETECTED Final   Enterotoxigenic E coli (ETEC) NOT DETECTED NOT DETECTED Final   Shiga like toxin producing E coli (STEC) NOT DETECTED NOT DETECTED Final   Shigella/Enteroinvasive E coli (EIEC) NOT DETECTED NOT DETECTED Final   Cryptosporidium NOT DETECTED NOT DETECTED Final   Cyclospora cayetanensis NOT DETECTED NOT DETECTED Final   Entamoeba histolytica NOT DETECTED NOT DETECTED Final   Giardia lamblia NOT DETECTED NOT DETECTED Final   Adenovirus F40/41 NOT DETECTED NOT DETECTED Final   Astrovirus NOT DETECTED NOT DETECTED Final   Norovirus GI/GII NOT DETECTED NOT DETECTED Final   Rotavirus A NOT DETECTED NOT DETECTED Final   Sapovirus (I, II, IV, and V) NOT DETECTED NOT DETECTED Final    Comment: Performed at [facility], [address] [place], [place] [address]         Radiology Studies: DG Abd 1 View  Result Date:  06/26/2023 CLINICAL DATA:  [id number] with acute hypoxic respiratory failure, 98749 with ileus. EXAM: ABDOMEN - 1 VIEW; PORTABLE CHEST - 1 VIEW COMPARISON:  Portable chest 06/24/2023, CT abdomen pelvis 06/22/2023. FINDINGS: Chest AP portable 4:08 a.m. NGT loops around the stomach to the left with the tip in the proximal lumen right of the midline. Right PICC has been pulled back into the upper SVC, previously terminating at about the superior cavoatrial junction. There is linear scarring or atelectasis in the medial right lower lung field. The lungs are otherwise clear. The sulci are sharp. The cardiomediastinal silhouette and vascular pattern are normal. No new osseous abnormality. Multiple overlying monitor wires. Flat plate abdomen 9:62 a.m.: Small-bowel dilatation is little changed today, with central abdominal small bowel segments measuring up to 5.6 cm. There is aeration in the ascending and transverse colon without pathologic dilatation or wall edema. There is a paucity of bowel aeration in the pelvis. There is no supine evidence for free air. Pelvic phleboliths noted without pathologic calcification. IMPRESSION: 1. No acute chest findings. 2. Right PICC has been pulled back into the upper SVC, previously terminating at about the superior cavoatrial junction. 3. NGT loops around the stomach to the left with the tip in the proximal lumen right of the midline. 4. Small-bowel dilatation is little changed today, with central abdominal small bowel segments measuring up to 5.6 cm. Electronically Signed   By: Almira Bar M.D.   On: 06/26/2023 06:20   DG Chest Port 1 View  Result Date: 06/26/2023 CLINICAL DATA:  [id number] with acute hypoxic respiratory failure, 98749 with ileus. EXAM: ABDOMEN - 1 VIEW; PORTABLE CHEST - 1 VIEW COMPARISON:  Portable chest 06/24/2023, CT abdomen pelvis 06/22/2023. FINDINGS: Chest AP portable 4:08 a.m. NGT loops around the stomach to the left with  the tip in the proximal lumen right of  the midline. Right PICC has been pulled back into the upper SVC, previously terminating at about the superior cavoatrial junction. There is linear scarring or atelectasis in the medial right lower lung field. The lungs are otherwise clear. The sulci are sharp. The cardiomediastinal silhouette and vascular pattern are normal. No new osseous abnormality. Multiple overlying monitor wires. Flat plate abdomen 5:00 a.m.: Small-bowel dilatation is little changed today, with central abdominal small bowel segments measuring up to 5.6 cm. There is aeration in the ascending and transverse colon without pathologic dilatation or wall edema. There is a paucity of bowel aeration in the pelvis. There is no supine evidence for free air. Pelvic phleboliths noted without pathologic calcification. IMPRESSION: 1. No acute chest findings. 2. Right PICC has been pulled back into the upper SVC, previously terminating at about the superior cavoatrial junction. 3. NGT loops around the stomach to the left with the tip in the proximal lumen right of the midline. 4. Small-bowel dilatation is little changed today, with central abdominal small bowel segments measuring up to 5.6 cm. Electronically Signed   By: Almira Bar M.D.   On: 06/26/2023 06:20        Scheduled Meds:  Chlorhexidine Gluconate Cloth  6 each Topical Daily   enoxaparin (LOVENOX) injection  40 mg Subcutaneous Q24H   feeding supplement  1 Container Oral TID BM   pantoprazole (PROTONIX) IV  40 mg Intravenous Q24H   QUEtiapine  50 mg Per Tube BID   sertraline  50 mg Per Tube Daily   sodium chloride flush  10-40 mL Intracatheter Q12H   thiamine (VITAMIN B1) injection  100 mg Intravenous Daily   Continuous Infusions:  piperacillin-tazobactam (ZOSYN)  IV 12.5 mL/hr at 06/27/23 0800   TPN (CLINIMIX-E) Adult 42 mL/hr at 06/27/23 0800     LOS: 10 days       Charise Killian, MD [facility] Pager [phone]  If 7PM-7AM, please contact  night-coverage [email or web address] 06/27/2023, 8:32 AM

## 2023-06-28 DIAGNOSIS — F10931 Alcohol use, unspecified with withdrawal delirium: Secondary | ICD-10-CM | POA: Diagnosis not present

## 2023-06-28 DIAGNOSIS — K56609 Unspecified intestinal obstruction, unspecified as to partial versus complete obstruction: Secondary | ICD-10-CM | POA: Diagnosis not present

## 2023-06-28 DIAGNOSIS — D72829 Elevated white blood cell count, unspecified: Secondary | ICD-10-CM | POA: Diagnosis not present

## 2023-06-28 DIAGNOSIS — R197 Diarrhea, unspecified: Secondary | ICD-10-CM | POA: Diagnosis not present

## 2023-06-28 DIAGNOSIS — E876 Hypokalemia: Secondary | ICD-10-CM | POA: Diagnosis not present

## 2023-06-28 LAB — CBC WITH DIFFERENTIAL/PLATELET
Abs Immature Granulocytes: 1.25 10*3/uL — ABNORMAL HIGH (ref 0.00–0.07)
Basophils Absolute: 0.2 10*3/uL — ABNORMAL HIGH (ref 0.0–0.1)
Basophils Relative: 1 %
Eosinophils Absolute: 0.2 10*3/uL (ref 0.0–0.5)
Eosinophils Relative: 1 %
HCT: 38.3 % — ABNORMAL LOW (ref 39.0–52.0)
Hemoglobin: 12.7 g/dL — ABNORMAL LOW (ref 13.0–17.0)
Immature Granulocytes: 8 %
Lymphocytes Relative: 23 %
Lymphs Abs: 3.7 10*3/uL (ref 0.7–4.0)
MCH: 29.7 pg (ref 26.0–34.0)
MCHC: 33.2 g/dL (ref 30.0–36.0)
MCV: 89.5 fL (ref 80.0–100.0)
Monocytes Absolute: 1.4 10*3/uL — ABNORMAL HIGH (ref 0.1–1.0)
Monocytes Relative: 9 %
Neutro Abs: 9.5 10*3/uL — ABNORMAL HIGH (ref 1.7–7.7)
Neutrophils Relative %: 58 %
Platelets: 913 10*3/uL (ref 150–400)
RBC: 4.28 MIL/uL (ref 4.22–5.81)
RDW: 14.6 % (ref 11.5–15.5)
Smear Review: INCREASED
WBC: 16.2 10*3/uL — ABNORMAL HIGH (ref 4.0–10.5)
nRBC: 0 % (ref 0.0–0.2)

## 2023-06-28 LAB — BASIC METABOLIC PANEL
Anion gap: 9 (ref 5–15)
BUN: 11 mg/dL (ref 6–20)
CO2: 25 mmol/L (ref 22–32)
Calcium: 9.1 mg/dL (ref 8.9–10.3)
Chloride: 98 mmol/L (ref 98–111)
Creatinine, Ser: 0.95 mg/dL (ref 0.61–1.24)
GFR, Estimated: >60 mL/min (ref >60)
Glucose, Bld: 112 mg/dL — ABNORMAL HIGH (ref 70–99)
Potassium: 3.7 mmol/L (ref 3.5–5.1)
Sodium: 132 mmol/L — ABNORMAL LOW (ref 135–145)

## 2023-06-28 LAB — C DIFFICILE (CDIFF) QUICK SCRN (NO PCR REFLEX)
C Diff antigen: NEGATIVE
C Diff interpretation: NOT DETECTED
C Diff toxin: NEGATIVE

## 2023-06-28 NOTE — Progress Notes (Signed)
Date of Admission:  06/15/2023     ID: Ryan Miranda is a 47 y.o. male Principal Problem:   Delirium tremens (HCC) Active Problems:   Depression with suicidal ideation   GERD without esophagitis   Leukocytosis   Hypokalemia   Hyponatremia   Suicidal ideation   Hypomagnesemia   Hypophosphatemia   SBO (small bowel obstruction) (HCC)   Fever    Subjective: Had 3 loose stools yesterday 1 today No pain abdomen Distension better  Medications:   Chlorhexidine Gluconate Cloth  6 each Topical Daily   enoxaparin (LOVENOX) injection  40 mg Subcutaneous Q24H   feeding supplement  237 mL Oral TID BM   multivitamin with minerals  1 tablet Oral Daily   pantoprazole  40 mg Oral QHS   QUEtiapine  50 mg Oral BID   sertraline  50 mg Oral Daily   sodium chloride flush  10-40 mL Intracatheter Q12H   thiamine  100 mg Oral Daily    Objective: Vital signs in last 24 hours: Patient Vitals for the past 24 hrs:  BP Temp Temp src Pulse Resp SpO2  06/28/23 1200 117/74 -- -- (!) 127 (!) 22 95 %  06/28/23 1155 110/83 -- -- (!) 117 17 97 %  06/28/23 1040 (!) 87/61 -- -- (!) 117 12 94 %  06/28/23 0800 (!) 85/62 97.8 F (36.6 C) Oral 89 10 99 %  06/28/23 0725 -- -- -- 89 12 95 %  06/28/23 0406 105/73 -- -- 99 15 98 %  06/28/23 0000 119/83 -- -- 94 (!) 29 98 %  06/27/23 2000 106/79 98.4 F (36.9 C) Oral -- 16 --  06/27/23 1800 (!) 118/95 -- -- -- 20 --  06/27/23 1600 (!) 130/118 97.7 F (36.5 C) Oral -- 20 96 %  06/27/23 1530 110/78 -- -- (!) 135 17 95 %      PHYSICAL EXAM:  General: Alert, cooperative, no distress, .  Chest b/l air entry [id number] Abd softer BS ++ CNS non focal  Lab Results    Latest Ref Rng & Units 06/27/2023    4:15 AM 06/26/2023    4:25 AM 06/25/2023    5:07 AM  CBC  WBC 4.0 - 10.5 K/uL 14.2  15.3  16.5   Hemoglobin 13.0 - 17.0 g/dL 16.1  09.6  04.5   Hematocrit 39.0 - 52.0 % 37.4  35.6  37.2   Platelets 150 - 400 K/uL 658  520  413         Latest Ref Rng & Units 06/27/2023    4:15 AM 06/26/2023    4:25 AM 06/25/2023    2:54 PM  CMP  Glucose 70 - 99 mg/dL 409  811    BUN 6 - 20 mg/dL 8  7    Creatinine 9.14 - 1.24 mg/dL 7.82  9.56    Sodium 213 - 145 mmol/L 133  135    Potassium 3.5 - 5.1 mmol/L 3.8  3.2  3.3   Chloride 98 - 111 mmol/L 99  99    CO2 22 - 32 mmol/L 27  29    Calcium 8.9 - 10.3 mg/dL 8.7  8.7        Microbiology: [facility] NG Xray abdomen Dilated loops of SI and colon   Assessment/Plan: Pt with ETOH abuse presenting with suicidal ideation   Encephalopathy resolved   Alcohol withdrawal/ DT needing precedex , phenobarbitol    New fever  - has resolved NO aspiration, blood culture  neg, no UTI Off antibiotics    Ileus- initially the diarrhea was thought to be due to ileus  Pt has intermittent diarrhea- Has small intestinal dilatation, colon dilated but no thickened wall Usually cdiff involves colon Because of worsening leucocytosis and now thrombocytosis will check cdiff   Hypokalemia-resolved   Hepatic steatosis       Discussed with patient and his nurse

## 2023-06-28 NOTE — Plan of Care (Signed)

## 2023-06-28 NOTE — Progress Notes (Signed)
PROGRESS NOTE    Ryan Miranda  NGE:[id number] DOB: Dec 12, 1975 DOA: 06/15/2023 PCP: Danella Penton, MD    Assessment & Plan:   Principal Problem:   Delirium tremens [facility]) Active Problems:   Hypokalemia   Hypomagnesemia   Hypophosphatemia   Leukocytosis   Hyponatremia   Depression with suicidal ideation   GERD without esophagitis   Suicidal ideation   SBO (small bowel obstruction) (HCC)   Fever  Assessment and Plan: Severe Alcohol Withdrawal with Delirium Tremens: continues to improve. Continue on MVI, folic acid, thiamine. Continue w/ sitter & IVC. Continue w/ seroquel. Completed phenobarbital taper. Had suicidal ideation. Medically cleared to go to inpatient pysch and made psych aware, currently waiting on a response    Hypotension: resolved  Hypokalemia: WNL   Hyponatremia:  likely secondary to beer potomania. Labile. Will continue to monitor   Hypomagnesemia: WNL today   Ileus: resolved. Now w/ multiple BMs. D/c NG tube 06/27/23. Advanced to regular diet today    Leukocytosis: labile. Abxs were d/c as per ID. Will continue to monitor. CXR was neg for pneumonia.   Thrombocytosis: etiology unclear. Will continue to monitor              DVT prophylaxis: lovenox Code Status: full  Family Communication:  Disposition Plan: likely d/c inpatient psych   Status is: Inpatient Remains inpatient appropriate because: medically stable. Needs inpatient psych admission     Level of care: Progressive Consultants:  ICU Psych   Procedures:   Antimicrobials:   Subjective: Pt c/o fatigue   Objective: Vitals:   06/28/23 0800 06/28/23 1040 06/28/23 1155 06/28/23 1200  BP: (!) 85/62 (!) 87/61 110/83 117/74  Pulse: 89 (!) 117 (!) 117 (!) 127  Resp: 10 12 17  (!) 22  Temp: 97.8 F (36.6 C)     TempSrc: Oral     SpO2: 99% 94% 97% 95%  Weight:      Height:        Intake/Output Summary (Last 24 hours) at 06/28/2023 1537 Last data filed at  06/28/2023 1230 Gross per 24 hour  Intake 1082.14 ml  Output 260 ml  Net 822.14 ml   Filed Weights   06/25/23 0500 06/26/23 0345 06/27/23 0400  Weight: 99.7 kg 96.3 kg 95.3 kg    Examination:  General exam: appears comfortable  Respiratory system: clear breath sounds b/l  Cardiovascular system: S1/S2+. No rubs or clicks  Gastrointestinal system: Abd is soft, NT, obese & normal bowel sounds  Central nervous system: alert & oriented. Moves all extremities  Psychiatry: Judgement and insight appears improved. Flat mood and affect    Data Reviewed: I have personally reviewed following labs and imaging studies  CBC: Recent Labs  Lab 06/24/23 0538 06/25/23 0507 06/26/23 0425 06/27/23 0415 06/28/23 1420  WBC 14.6* 16.5* 15.3* 14.2* 16.2*  NEUTROABS  --   --   --   --  9.5*  HGB 11.8* 12.7* 11.9* 12.4* 12.7*  HCT 34.7* 37.2* 35.6* 37.4* 38.3*  MCV 87.4 87.1 87.7 90.3 89.5  PLT 350 413* 520* 658* 913*   Basic Metabolic Panel: Recent Labs  Lab 06/23/23 2127 06/24/23 0532 06/24/23 0538 06/25/23 0507 06/25/23 1454 06/26/23 0425 06/27/23 0415 06/28/23 1420  NA 136 134*  --  135  --  135 133* 132*  K 3.1* 3.8  --  2.8* 3.3* 3.2* 3.8 3.7  CL 97* 96*  --  95*  --  99 99 98  CO2 28 25  --  28  --  29 27 25   GLUCOSE 104* 92  --  111*  --  111* 122* 112*  BUN 8 7  --  6  --  7 8 11   CREATININE 0.76 0.83  --  0.72  --  0.65 0.69 0.95  CALCIUM 8.4* 8.2*  --  8.5*  --  8.7* 8.7* 9.1  MG 1.9  --  2.4 2.1 2.0 1.8 2.2  --   PHOS 3.4 2.7  --  3.1  --  2.7 3.7  --    GFR: Estimated Creatinine Clearance: 109 mL/min (by C-G formula based on SCr of 0.95 mg/dL). Liver Function Tests: Recent Labs  Lab 06/22/23 0358 06/23/23 0441 06/24/23 0532 06/25/23 0507  AST 37  --   --   --   ALT 26  --   --   --   ALKPHOS 76  --   --   --   BILITOT 1.2*  --   --   --   PROT 6.8  --   --   --   ALBUMIN 3.3*  3.4* 3.0* 2.9* 3.2*   Recent Labs  Lab 06/22/23 0358  LIPASE 35   No  results for input(s): "AMMONIA" in the last 168 hours.  Coagulation Profile: No results for input(s): "INR", "PROTIME" in the last 168 hours. Cardiac Enzymes: No results for input(s): "CKTOTAL", "CKMB", "CKMBINDEX", "TROPONINI" in the last 168 hours. BNP (last 3 results) No results for input(s): "PROBNP" in the last 8760 hours. HbA1C: No results for input(s): "HGBA1C" in the last 72 hours. CBG: Recent Labs  Lab 06/25/23 2313 06/26/23 0846 06/26/23 1617 06/26/23 2312 06/27/23 0728  GLUCAP 98 92 110* 126* 112*   Lipid Profile: No results for input(s): "CHOL", "HDL", "LDLCALC", "TRIG", "CHOLHDL", "LDLDIRECT" in the last 72 hours.  Thyroid Function Tests: No results for input(s): "TSH", "T4TOTAL", "FREET4", "T3FREE", "THYROIDAB" in the last 72 hours. Anemia Panel: No results for input(s): "VITAMINB12", "FOLATE", "FERRITIN", "TIBC", "IRON", "RETICCTPCT" in the last 72 hours. Sepsis Labs: Recent Labs  Lab 06/24/23 0538  PROCALCITON 0.43    Recent Results (from the past 240 hours)  Culture, blood (Routine X 2) w Reflex to ID Panel     Status: None (Preliminary result)   Collection Time: 06/24/23  5:38 AM   Specimen: BLOOD  Result Value Ref Range Status   Specimen Description BLOOD BLOOD LEFT ARM  Final   Special Requests   Final    BOTTLES DRAWN AEROBIC AND ANAEROBIC Blood Culture adequate volume   Culture   Final    NO GROWTH 4 DAYS Performed at [facility], [address], [place], [place] [address]    Report Status PENDING  Incomplete  Culture, blood (Routine X 2) w Reflex to ID Panel     Status: None (Preliminary result)   Collection Time: 06/24/23  5:40 AM   Specimen: BLOOD  Result Value Ref Range Status   Specimen Description BLOOD BLOOD LEFT HAND  Final   Special Requests   Final    BOTTLES DRAWN AEROBIC AND ANAEROBIC Blood Culture adequate volume   Culture   Final    NO GROWTH 4 DAYS Performed at [facility], [address]  [place], [place] [address]    Report Status PENDING  Incomplete  Gastrointestinal Panel by PCR , Stool     Status: None   Collection Time: 06/25/23  7:20 PM  Result Value Ref Range Status   Campylobacter species NOT DETECTED  NOT DETECTED Final   Plesimonas shigelloides NOT DETECTED NOT DETECTED Final   Salmonella species NOT DETECTED NOT DETECTED Final   Yersinia enterocolitica NOT DETECTED NOT DETECTED Final   Vibrio species NOT DETECTED NOT DETECTED Final   Vibrio cholerae NOT DETECTED NOT DETECTED Final   Enteroaggregative E coli (EAEC) NOT DETECTED NOT DETECTED Final   Enteropathogenic E coli (EPEC) NOT DETECTED NOT DETECTED Final   Enterotoxigenic E coli (ETEC) NOT DETECTED NOT DETECTED Final   Shiga like toxin producing E coli (STEC) NOT DETECTED NOT DETECTED Final   Shigella/Enteroinvasive E coli (EIEC) NOT DETECTED NOT DETECTED Final   Cryptosporidium NOT DETECTED NOT DETECTED Final   Cyclospora cayetanensis NOT DETECTED NOT DETECTED Final   Entamoeba histolytica NOT DETECTED NOT DETECTED Final   Giardia lamblia NOT DETECTED NOT DETECTED Final   Adenovirus F40/41 NOT DETECTED NOT DETECTED Final   Astrovirus NOT DETECTED NOT DETECTED Final   Norovirus GI/GII NOT DETECTED NOT DETECTED Final   Rotavirus A NOT DETECTED NOT DETECTED Final   Sapovirus (I, II, IV, and V) NOT DETECTED NOT DETECTED Final    Comment: Performed at [facility], [address], [place], [place] [address]         Radiology Studies: No results found.      Scheduled Meds:  Chlorhexidine Gluconate Cloth  6 each Topical Daily   enoxaparin (LOVENOX) injection  40 mg Subcutaneous Q24H   feeding supplement  237 mL Oral TID BM   multivitamin with minerals  1 tablet Oral Daily   pantoprazole  40 mg Oral QHS   QUEtiapine  50 mg Oral BID   sertraline  50 mg Oral Daily   sodium chloride flush  10-40 mL Intracatheter Q12H   thiamine  100 mg Oral Daily   Continuous Infusions:     LOS: 11 days        Charise Killian, MD [facility] Pager [phone]  If 7PM-7AM, please contact night-coverage [email or web address] 06/28/2023, 3:37 PM

## 2023-06-29 DIAGNOSIS — F10931 Alcohol use, unspecified with withdrawal delirium: Secondary | ICD-10-CM | POA: Diagnosis not present

## 2023-06-29 LAB — CBC
HCT: 37.5 % — ABNORMAL LOW (ref 39.0–52.0)
Hemoglobin: 12.6 g/dL — ABNORMAL LOW (ref 13.0–17.0)
MCH: 29.6 pg (ref 26.0–34.0)
MCHC: 33.6 g/dL (ref 30.0–36.0)
MCV: 88.2 fL (ref 80.0–100.0)
Platelets: 1035 10*3/uL (ref 150–400)
RBC: 4.25 MIL/uL (ref 4.22–5.81)
RDW: 14.8 % (ref 11.5–15.5)
WBC: 16.3 10*3/uL — ABNORMAL HIGH (ref 4.0–10.5)
nRBC: 0 % (ref 0.0–0.2)

## 2023-06-29 LAB — CULTURE, BLOOD (ROUTINE X 2)
Culture: NO GROWTH
Culture: NO GROWTH
Special Requests: ADEQUATE
Special Requests: ADEQUATE

## 2023-06-29 MED ORDER — ASPIRIN 81 MG PO TBEC
81.0000 mg | DELAYED_RELEASE_TABLET | Freq: Every day | ORAL | Status: DC
  Administered 2023-06-29 – 2023-06-30 (×2): 81 mg via ORAL
  Filled 2023-06-29 (×2): qty 1

## 2023-06-29 MED ORDER — QUETIAPINE FUMARATE 50 MG PO TABS: 50.0000 mg | ORAL_TABLET | Freq: Two times a day (BID) | ORAL | Status: DC

## 2023-06-29 MED ORDER — ASPIRIN 81 MG PO TBEC: 81.0000 mg | DELAYED_RELEASE_TABLET | Freq: Every day | ORAL | Status: AC

## 2023-06-29 NOTE — Progress Notes (Signed)
Physical Therapy Treatment Patient Details Name: Ryan Miranda MRN: [id number] DOB: 1975-11-10 Today's Date: 06/29/2023   History of Present Illness Ryan Miranda is a 47 y.o. male with medical history significant for essential hypertension, depression, OSA on CPAP and alcohol abuse, presented to the emergency room with acute onset of suicidal ideation and on 11/29 which have been recurrent lately and got worse over the last 3 days. During his ER stay he became significantly anxious and tachycardic and hypertensive, developed alcohol withdrawal with hallucination and delirium which required admission.    PT Comments  Pt was sitting in recliner upon arrival. He is alert and agreeable. One on one sitter in place. Pt demonstrates safe abilities to stand and ambulate without AD. Easily ambulated two laps in units without difficulty. Acute PT will sign off after one more session. No formal PT needs at DC from acute hospital.    If plan is discharge home, recommend the following: Assist for transportation;A little help with walking and/or transfers     Equipment Recommendations  None recommended by PT       Precautions / Restrictions Precautions Precautions: Fall Restrictions Weight Bearing Restrictions Per Provider Order: No     Mobility  Bed Mobility  General bed mobility comments: in recliner pre/post session    Transfers Overall transfer level: Needs assistance Equipment used: None Transfers: Sit to/from Stand Sit to Stand: Supervision     Ambulation/Gait Ambulation/Gait assistance: Supervision, Contact guard assist Gait Distance (Feet): 400 Feet Assistive device: None Gait Pattern/deviations: WFL(Within Functional Limits) Gait velocity: WNL  General Gait Details: pt ambulate 2 laps around unit without AD. mostly supervision but occasional CGA for additional safety    Balance Overall balance assessment: Needs assistance Sitting-balance support: No  upper extremity supported, Feet supported Sitting balance-Leahy Scale: Normal     Standing balance support: No upper extremity supported, During functional activity Standing balance-Leahy Scale: Good       Cognition Arousal: Alert Behavior During Therapy: WFL for tasks assessed/performed Overall Cognitive Status: Within Functional Limits for tasks assessed      General Comments: Pt is alert and seems to have good insight of current situation and POC going forward. one on one sitter in room               Pertinent Vitals/Pain Pain Assessment Pain Assessment: No/denies pain Pain Score: 0-No pain     PT Goals (current goals can now be found in the care plan section) Acute Rehab PT Goals Patient Stated Goal: get home eventually but know I have to go downstair first Progress towards PT goals: Progressing toward goals    Frequency    Min 1X/week       AM-PAC PT "6 Clicks" Mobility   Outcome Measure  Help needed turning from your back to your side while in a flat bed without using bedrails?: None Help needed moving from lying on your back to sitting on the side of a flat bed without using bedrails?: None Help needed moving to and from a bed to a chair (including a wheelchair)?: None Help needed standing up from a chair using your arms (e.g., wheelchair or bedside chair)?: A Little Help needed to walk in hospital room?: A Little Help needed climbing 3-5 steps with a railing? : A Little 6 Click Score: 21    End of Session   Activity Tolerance: Patient tolerated treatment well Patient left: in chair;with call bell/phone within reach;with family/visitor present;with nursing/sitter in room Nurse Communication: Mobility  status PT Visit Diagnosis: Muscle weakness (generalized) (M62.81);Difficulty in walking, not elsewhere classified (R26.2);Pain     Time: 1001-1009 PT Time Calculation (min) (ACUTE ONLY): 8 min  Charges:    $Gait Training: 8-22 mins PT General  Charges $$ ACUTE PT VISIT: 1 Visit                     Jetta Lout PTA 06/29/23, 12:21 PM

## 2023-06-29 NOTE — Progress Notes (Signed)
ID Cdiff negative No fever Not on any antibiotics  ID will sign off- call if needed

## 2023-06-29 NOTE — Discharge Summary (Signed)
Physician Discharge Summary  Ryan Miranda WJX:[id number] DOB: Oct 27, 1975 DOA: 06/15/2023  PCP: Danella Penton, MD  Admit date: 06/15/2023 Discharge date: 06/29/2023  Admitted From: home  Disposition:  inpatient BU at [facility]  Recommendations for Outpatient Follow-up:  Follow up with PCP in 1-2 weeks F/u w/ psych in 1-2 weeks   Home Health: no  Equipment/Devices:  Discharge Condition: stable  CODE STATUS: full  Diet recommendation: Regular   Brief/Interim Summary: HPI was taken from Dr. Arville Care: Ryan Miranda is a 47 y.o. male with medical history significant for essential hypertension, depression, OSA on CPAP and alcohol abuse, presented to the emergency room with acute onset of suicidal ideation and on 11/29 which have been recurrent lately and got worse over the last 3 days.  He reported that he loaded his shotgun earlier during the day with a plan to kill himself but was stopped by son.  He texted a photo of his gun to his ex-wife reporting that he was going to kill himself.  He admitted to alcohol abuse.  He has been observed in the ER.  During his ER stay he became significantly anxious and tachycardic and hypertensive.  He received IV phenobarbital and intermittent benzodiazepine therapy for several doses for alcohol withdrawal.  He later had altered mental status and repeat lab work showed improvement of initially elevated anion gap but was still hypokalemic and that was replaced.  Ammonia level was elevated and for which she was given p.o. lactulose.  He was then given IV Valium that helped calm him down.  The patient was very somnolent and no history could be obtained from him during my interview.   ED Course: Upon presentation to the emergency room, BP was 171/131 with heart rate of 140 with otherwise unremarkable vital signs.  Labs revealed hyponatremia 129 and potassium of 3.1 and chloride 92 calcium 8.7 with total bili of 2.9.  CBC showed leukocytosis of 13.2  compared to 15.6 at couple days ago.  Tylenol level was less than 10 a couple days ago and salicylate level less than 7.  Alcohol level then was 365. EKG as reviewed by me : EKG showed no sinus rhythm with rate of 86 with poor R wave progression and T wave inversion inferiorly. Imaging: Noncontrast head CT scan revealed no acute ventricular abnormality.   The patient was placed on CIWA protocol and was given 2 L bolus of IV normal saline, 40 mill Cabbell p.o. potassium chloride in addition to IM phenobarbital, 4 mg of IV Zofran and 5 mg of p.o. Zyprexa, 50 mg p.o. Lopressor and 2 mg of IV Ativan X3, 30 g of p.o. lactulose and 10 mg of IV hydralazine and 5 mg of IV Haldol as well as 10 mg of IV Valium X2 with 5 mg of IV Haldol for another dose.   He will be admitted to a stepdown unit bed for further evaluation and management.  As per NP Orson Gear 11/29: Presented wit suicidal ideation.  Placed under IVC, To be admitted to behavioral health. 11/30: Was to be transferred to [facility] behavioral health hospital, however CIWA score too high in setting of developing alcohol withdrawal.  Hospitilaist asked to admit for medical management of withdrawals. 12/1: Vital stable, potassium with further decline to 2.7 which is being repleted, magnesium 2.5, improving leukocytosis at 11.3.  CIWA score of 4. 12/2: Vital stable, overnight became very agitated and combative and started on Precedex infusion.  Currently sedated. 12/3: Patient again became very agitated  and combative in the evening, started on phenobarbital. 12/4: Remains on Precedex. Mental status fluctuates b/w sedated and agitation. PCCM consulted.  Abdomen distended, KUB concerning for possible SBO. 12/5: Nursing unsuccessful at placing NGT overnight x2, since reported multiple BM's overnight.  Remains on Precedex, weaning down.  Leukocytosis improving. 12/6 DT slowly improving, remains on precedex 12/7 worsening DT's, US shows Liver disease, CT adb +Ileus.  PICC line placed 12/8: Positive for fevers, high risk for aspiration, Zosyn started. Weaned off Precedex, mental status improved, working with PT. 12/9: Earlier this morning with severe agitation and aggression towards staff, Precedex resumed along with pushes of ativan and morphine.  Became hypotensive with heavy sedation requiring Levophed.  Adding scheduled Seroquel to assist with Precedex weaning.  ID weighing in on intermittent fevers and diarrhea. 12/10: Weaned off Precedex and Levophed, mentation much improved. No fevers overnight, GI panel negative  Discharge Diagnoses:  Principal Problem:   Delirium tremens (HCC) Active Problems:   Hypokalemia   Hypomagnesemia   Hypophosphatemia   Leukocytosis   Hyponatremia   Depression with suicidal ideation   GERD without esophagitis   Suicidal ideation   SBO (small bowel obstruction) (HCC)   Fever   Diarrhea of presumed infectious origin   Alcohol withdrawal delirium, acute, hyperactive (HCC)  Severe Alcohol Withdrawal with Delirium Tremens: continues to improve. Continue on MVI, folic acid, thiamine. Continue w/ sitter & IVC. Continue w/ seroquel. Completed phenobarbital taper. Had suicidal ideation. Medically cleared to go to inpatient pysch and made psych aware, currently waiting on a response    Hypotension: resolved   Hypokalemia: WNL    Hyponatremia:  likely secondary to beer potomania. Labile. Will continue to monitor    Hypomagnesemia: WNL today   Ileus: resolved. Now w/ multiple BMs. D/c NG tube 06/27/23. Tolerating a regular diet    Leukocytosis: labile. Abxs were d/c as per ID. Will continue to monitor. CXR was neg for pneumonia.    Thrombocytosis: etiology unclear. Start aspirin. Will need to f/u outpatient w/ heme. Will continue to monitor     Discharge Instructions  Discharge Instructions     Diet general   Complete by: As directed    Discharge instructions   Complete by: As directed    F/u w/ PCP in 1-2  weeks. F/u w/ psychiatry in 1-2 weeks   Increase activity slowly   Complete by: As directed       Allergies as of 06/29/2023   No Known Allergies      Medication List     TAKE these medications    amitriptyline 10 MG tablet Commonly known as: ELAVIL Take 10 mg by mouth at bedtime.   ibuprofen 200 MG tablet Commonly known as: ADVIL Take 200 mg by mouth every 6 (six) hours as needed for fever or mild pain (pain score 1-3).   pantoprazole 40 MG tablet Commonly known as: PROTONIX Take 40 mg by mouth daily.   QUEtiapine 50 MG tablet Commonly known as: SEROQUEL Take 1 tablet (50 mg total) by mouth 2 (two) times daily.   sertraline 50 MG tablet Commonly known as: ZOLOFT Take 50 mg by mouth daily.        No Known Allergies  Consultations: ICU Psych    Procedures/Studies: DG Abd 1 View Result Date: 06/26/2023 CLINICAL DATA:  [id number] with acute hypoxic respiratory failure, [id number] with ileus. EXAM: ABDOMEN - 1 VIEW; PORTABLE CHEST - 1 VIEW COMPARISON:  Portable chest 06/24/2023, CT abdomen pelvis 06/22/2023. FINDINGS: Chest AP portable  4:08 a.m. NGT loops around the stomach to the left with the tip in the proximal lumen right of the midline. Right PICC has been pulled back into the upper SVC, previously terminating at about the superior cavoatrial junction. There is linear scarring or atelectasis in the medial right lower lung field. The lungs are otherwise clear. The sulci are sharp. The cardiomediastinal silhouette and vascular pattern are normal. No new osseous abnormality. Multiple overlying monitor wires. Flat plate abdomen 8:46 a.m.: Small-bowel dilatation is little changed today, with central abdominal small bowel segments measuring up to 5.6 cm. There is aeration in the ascending and transverse colon without pathologic dilatation or wall edema. There is a paucity of bowel aeration in the pelvis. There is no supine evidence for free air. Pelvic phleboliths noted without  pathologic calcification. IMPRESSION: 1. No acute chest findings. 2. Right PICC has been pulled back into the upper SVC, previously terminating at about the superior cavoatrial junction. 3. NGT loops around the stomach to the left with the tip in the proximal lumen right of the midline. 4. Small-bowel dilatation is little changed today, with central abdominal small bowel segments measuring up to 5.6 cm. Electronically Signed   By: Almira Bar M.D.   On: 06/26/2023 06:20   DG Chest Port 1 View Result Date: 06/26/2023 CLINICAL DATA:  [id number] with acute hypoxic respiratory failure, 98749 with ileus. EXAM: ABDOMEN - 1 VIEW; PORTABLE CHEST - 1 VIEW COMPARISON:  Portable chest 06/24/2023, CT abdomen pelvis 06/22/2023. FINDINGS: Chest AP portable 4:08 a.m. NGT loops around the stomach to the left with the tip in the proximal lumen right of the midline. Right PICC has been pulled back into the upper SVC, previously terminating at about the superior cavoatrial junction. There is linear scarring or atelectasis in the medial right lower lung field. The lungs are otherwise clear. The sulci are sharp. The cardiomediastinal silhouette and vascular pattern are normal. No new osseous abnormality. Multiple overlying monitor wires. Flat plate abdomen 8:41 a.m.: Small-bowel dilatation is little changed today, with central abdominal small bowel segments measuring up to 5.6 cm. There is aeration in the ascending and transverse colon without pathologic dilatation or wall edema. There is a paucity of bowel aeration in the pelvis. There is no supine evidence for free air. Pelvic phleboliths noted without pathologic calcification. IMPRESSION: 1. No acute chest findings. 2. Right PICC has been pulled back into the upper SVC, previously terminating at about the superior cavoatrial junction. 3. NGT loops around the stomach to the left with the tip in the proximal lumen right of the midline. 4. Small-bowel dilatation is little changed  today, with central abdominal small bowel segments measuring up to 5.6 cm. Electronically Signed   By: Almira Bar M.D.   On: 06/26/2023 06:20   DG Chest Port 1 View Result Date: 06/24/2023 CLINICAL DATA:  Aspiration pneumonia. EXAM: PORTABLE CHEST 1 VIEW COMPARISON:  06/21/2023 FINDINGS: Low volume film. No focal consolidation or overt pulmonary edema. No pleural effusion. The cardio pericardial silhouette is enlarged. NG tube tip is in the stomach with proximal side port below the GE junction. Right PICC line tip overlies the distal SVC level. IMPRESSION: Low volume film without acute cardiopulmonary findings. Electronically Signed   By: Kennith Center M.D.   On: 06/24/2023 08:12   [place] EKG SITE RITE Result Date: 06/23/2023 If Site Rite image not attached, placement could not be confirmed due to current cardiac rhythm.  CT ABDOMEN PELVIS WO CONTRAST Result Date: 06/22/2023  CLINICAL DATA:  Abdominal pain EXAM: CT ABDOMEN AND PELVIS WITHOUT CONTRAST TECHNIQUE: Multidetector CT imaging of the abdomen and pelvis was performed following the standard protocol without IV contrast. RADIATION DOSE REDUCTION: This exam was performed according to the departmental dose-optimization program which includes automated exposure control, adjustment of the mA and/or kV according to patient size and/or use of iterative reconstruction technique. COMPARISON:  Abdominal radiograph dated 06/22/2023 FINDINGS: Lower chest: Small bilateral pleural effusions. Associated patchy right lower lobe and left basilar opacities, likely atelectasis Hepatobiliary: Mild hepatic steatosis. Layering gallbladder sludge versus noncalcified gallstones (series 2/image 26), without associated inflammatory changes. No intrahepatic or extrahepatic dilatation. Pancreas: Within normal limits. Spleen: Within normal limits. Adrenals/Urinary Tract: Adrenal glands are within normal limits. Kidneys are within normal limits. No renal, ureteral, or bladder  calculi. No hydronephrosis. Bladder is within normal limits. Stomach/Bowel: Enteric tube terminates in the posterior gastric body. Dilated loops of small bowel in the central abdomen, extending to the distal/terminal ileum. Colon is not decompressed. Overall appearance favors adynamic ileus over small bowel obstruction. Gas in fluid layering dependently in the small bowel and colon. No pneumatosis or free air. Vascular/Lymphatic: No evidence of abdominal aortic aneurysm. No suspicious abdominopelvic lymphadenopathy. Reproductive: Prostate is grossly unremarkable. Other: No abdominopelvic ascites. Musculoskeletal: Visualized osseous structures are within normal limits. IMPRESSION: Dilated loops of small bowel and colon, favoring adynamic ileus over small bowel obstruction. Enteric tube terminates in the posterior gastric body. Small bilateral pleural effusions. Associated patchy right lower lobe and left basilar opacities, likely atelectasis. Electronically Signed   By: Charline Bills M.D.   On: 06/22/2023 18:57   DG Naso G Tube Plc W/Fl W/Rad Result Date: 06/22/2023 INDICATION: Provided history: Encounter for nasogastric tube placement. Additional history: Multiple failed bedside nasogastric tube placement attempts. Request received for fluoroscopically-guided nasogastric tube placement for gastric/bowel decompression. EXAM: NASO G TUBE PLACEMENT WITH FL AND WITH RAD COMPARISON:  None. CONTRAST:  None FLUOROSCOPY TIME:  Radiation Exposure Index (if provided by fluoroscopic device): 11.60 mGy air Kerma COMPLICATIONS: None immediate. PROCEDURE: The patient was placed supine on the fluoroscopy table and the left nostril was prepped with viscous lidocaine. The nasogastric tube was also lubricated with viscous lidocaine and inserted into the left nostril without difficulty. Under intermittent fluoroscopic guidance, the nasogastric tube was easily advanced into the esophagus and through the gastroesophageal junction  into the gastric body/fundus (with immediate return of dark green/bilious appearing output). A spot fluoroscopic image demonstrated tip of the tube terminating within the gastric body/fundus. The image was saved and sent to PACS. After drainage of approximately 300 mL of gastric contents, the tube was clamped so that the patient could undergo a CT scan. The tube was affixed to the patient's nose with tape. The patient tolerated the procedure well without immediate post-procedure complication. Procedure performed by Lynnette Caffey, PA-C and supervised by Dr. Jackey Loge. FINDINGS: Fluoroscopic imaging demonstrated the nasogastric tube terminating in the expected location of the gastric body/fundus. IMPRESSION: Successful fluoroscopically-guided nasogastric tube placement (with tube terminating in the gastric body/fundus). Electronically Signed   By: Jackey Loge D.O.   On: 06/22/2023 15:42   US Abdomen Limited RUQ (LIVER/GB) Result Date: 06/22/2023 CLINICAL DATA:  47 year old male with abnormal LFTs. EXAM: ULTRASOUND ABDOMEN LIMITED RIGHT UPPER QUADRANT COMPARISON:  CT Abdomen and Pelvis 12/27/2018. FINDINGS: Gallbladder: No gallstones or wall thickening visualized. No sonographic Murphy sign noted by sonographer. Common bile duct: Diameter: 4 mm, normal. Liver: Dense, echogenic liver difficult to penetrate (image 23). Perihepatic  ascites (image 30) is new from 2020. No discrete liver mass, but extremely heterogeneous parenchyma. Portal vein is patent on color Doppler imaging with normal direction of blood flow towards the liver. Other: Grossly negative right kidney. IMPRESSION: 1. Severely heterogeneous liver parenchyma and new ascites, superimposed on hepatic steatosis seen in 2020. The constellation indicates worsening liver disease, liver decompensation. No obvious liver mass by ultrasound, but MRI would be most sensitive (should be deferred until the patient is generally well and can breath hold). 2.  Negative gallbladder.  No evidence of bile duct obstruction. Electronically Signed   By: Odessa Fleming M.D.   On: 06/22/2023 11:35   DG Abd 1 View Result Date: 06/22/2023 CLINICAL DATA:  Ileus. EXAM: ABDOMEN - 1 VIEW COMPARISON:  June 21, 2023. FINDINGS: Stable gastric distension and small bowel dilatation is noted. No colonic dilatation. IMPRESSION: Stable gastric distention of small bowel dilatation concerning for ileus or possibly obstruction. Electronically Signed   By: Lupita Raider M.D.   On: 06/22/2023 09:05   DG Chest Port 1 View Result Date: 06/21/2023 CLINICAL DATA:  47 year old male small-bowel obstruction, abdominal distension. EXAM: PORTABLE CHEST 1 VIEW COMPARISON:  Abdominal radiograph today. FINDINGS: Portable AP supine view at 0502 hours. Low lung volumes with crowding of lung markings. Gas distended stomach under the left diaphragm, see comparison. Visualized tracheal air column is within normal limits. No pneumothorax, pleural effusion or confluent lung opacity. No convincing edema. No acute osseous abnormality identified. IMPRESSION: 1. Low lung volumes, otherwise no acute cardiopulmonary abnormality. 2. Gas distended stomach, see abdomen radiographs today. Electronically Signed   By: Odessa Fleming M.D.   On: 06/21/2023 08:42   DG Abd 1 View Result Date: 06/21/2023 CLINICAL DATA:  47 year old male small-bowel obstruction, abdominal distension. EXAM: ABDOMEN - 1 VIEW COMPARISON:  Abdominal radiographs yesterday. FINDINGS: Portable AP supine views at 0458 hours. Diffusely gas distended stomach and bowel loops. Although suspect both small and large bowel loops are gas distended. No improvement since yesterday. No pneumoperitoneum evident on these supine views. Lung bases appear negative. No acute osseous abnormality identified. IMPRESSION: No improvement in diffuse gas distended stomach and bowel loops since yesterday, ileus (favored) versus mechanical bowel obstruction. Electronically Signed    By: Odessa Fleming M.D.   On: 06/21/2023 08:41   DG Abd 1 View Result Date: 06/20/2023 CLINICAL DATA:  Abdominal distension. EXAM: ABDOMEN - 1 VIEW COMPARISON:  None. FINDINGS: Moderate small bowel dilatation is noted without definite colonic dilatation, concerning for distal small bowel obstruction. Moderate gastric distention is noted as well. Phleboliths noted in the pelvis. IMPRESSION: Moderate gastric distension and small bowel dilatation is noted concerning for distal small bowel obstruction. Electronically Signed   By: Lupita Raider M.D.   On: 06/20/2023 11:30   CT Head Wo Contrast Result Date: 06/17/2023 CLINICAL DATA:  Altered mental status. Patient is suicidal with history of depression. EXAM: CT HEAD WITHOUT CONTRAST TECHNIQUE: Contiguous axial images were obtained from the base of the skull through the vertex without intravenous contrast. RADIATION DOSE REDUCTION: This exam was performed according to the departmental dose-optimization program which includes automated exposure control, adjustment of the mA and/or kV according to patient size and/or use of iterative reconstruction technique. COMPARISON:  January 05, 2020 FINDINGS: Brain: No evidence of acute infarction, hemorrhage, hydrocephalus, extra-axial collection or mass lesion/mass effect. Mild, stable nonspecific chronic cerebral white matter disease is noted. Vascular: No hyperdense vessel or unexpected calcification. Skull: Normal. Negative for fracture or focal lesion.  Sinuses/Orbits: No acute finding. Other: None. IMPRESSION: No acute intracranial abnormality. Electronically Signed   By: Aram Candela M.D.   On: 06/17/2023 02:25   (Echo, Carotid, EGD, Colonoscopy, ERCP)    Subjective: Pt c/o fatigue    Discharge Exam: Vitals:   06/29/23 0701 06/29/23 1138  BP:    Pulse:    Resp:    Temp: (!) 97.5 F (36.4 C) 98 F (36.7 C)  SpO2:     Vitals:   06/29/23 0600 06/29/23 0700 06/29/23 0701 06/29/23 1138  BP: 118/83 93/65     Pulse: 91 81    Resp: 19 13    Temp:   (!) 97.5 F (36.4 C) 98 F (36.7 C)  TempSrc:   Oral Oral  SpO2: 99% 92%    Weight:      Height:        General: Pt is alert, awake, not in acute distress Cardiovascular: S1/S2 +, no rubs, no gallops Respiratory: CTA bilaterally, no wheezing, no rhonchi Abdominal: Soft, NT, ND, bowel sounds + Extremities: no edema, no cyanosis    The results of significant diagnostics from this hospitalization (including imaging, microbiology, ancillary and laboratory) are listed below for reference.     Microbiology: Recent Results (from the past 240 hours)  Culture, blood (Routine X 2) w Reflex to ID Panel     Status: None   Collection Time: 06/24/23  5:38 AM   Specimen: BLOOD  Result Value Ref Range Status   Specimen Description BLOOD BLOOD LEFT ARM  Final   Special Requests   Final    BOTTLES DRAWN AEROBIC AND ANAEROBIC Blood Culture adequate volume   Culture   Final    NO GROWTH 5 DAYS Performed at [facility], [address] [place], [place] [address]    Report Status 06/29/2023 FINAL  Final  Culture, blood (Routine X 2) w Reflex to ID Panel     Status: None   Collection Time: 06/24/23  5:40 AM   Specimen: BLOOD  Result Value Ref Range Status   Specimen Description BLOOD BLOOD LEFT HAND  Final   Special Requests   Final    BOTTLES DRAWN AEROBIC AND ANAEROBIC Blood Culture adequate volume   Culture   Final    NO GROWTH 5 DAYS Performed at [facility], [address] [place], [place] [address]    Report Status 06/29/2023 FINAL  Final  Gastrointestinal Panel by PCR , Stool     Status: None   Collection Time: 06/25/23  7:20 PM  Result Value Ref Range Status   Campylobacter species NOT DETECTED NOT DETECTED Final   Plesimonas shigelloides NOT DETECTED NOT DETECTED Final   Salmonella species NOT DETECTED NOT DETECTED Final   Yersinia enterocolitica NOT DETECTED NOT DETECTED Final   Vibrio species NOT DETECTED NOT  DETECTED Final   Vibrio cholerae NOT DETECTED NOT DETECTED Final   Enteroaggregative E coli (EAEC) NOT DETECTED NOT DETECTED Final   Enteropathogenic E coli (EPEC) NOT DETECTED NOT DETECTED Final   Enterotoxigenic E coli (ETEC) NOT DETECTED NOT DETECTED Final   Shiga like toxin producing E coli (STEC) NOT DETECTED NOT DETECTED Final   Shigella/Enteroinvasive E coli (EIEC) NOT DETECTED NOT DETECTED Final   Cryptosporidium NOT DETECTED NOT DETECTED Final   Cyclospora cayetanensis NOT DETECTED NOT DETECTED Final   Entamoeba histolytica NOT DETECTED NOT DETECTED Final   Giardia lamblia NOT DETECTED NOT DETECTED Final   Adenovirus F40/41 NOT DETECTED NOT DETECTED Final  Astrovirus NOT DETECTED NOT DETECTED Final   Norovirus GI/GII NOT DETECTED NOT DETECTED Final   Rotavirus A NOT DETECTED NOT DETECTED Final   Sapovirus (I, II, IV, and V) NOT DETECTED NOT DETECTED Final    Comment: Performed at [facility], [address], [place], [place] [address]  C Difficile Quick Screen (NO PCR Reflex)     Status: None   Collection Time: 06/28/23  5:24 PM   Specimen: STOOL  Result Value Ref Range Status   C Diff antigen NEGATIVE NEGATIVE Final   C Diff toxin NEGATIVE NEGATIVE Final   C Diff interpretation No C. difficile detected.  Final    Comment: Performed at [facility], [address] [place], [place] [address]     Labs: BNP (last 3 results) No results for input(s): "BNP" in the last 8760 hours. Basic Metabolic Panel: Recent Labs  Lab 06/23/23 2127 06/24/23 0532 06/24/23 0538 06/25/23 0507 06/25/23 1454 06/26/23 0425 06/27/23 0415 06/28/23 1420  NA 136 134*  --  135  --  135 133* 132*  K 3.1* 3.8  --  2.8* 3.3* 3.2* 3.8 3.7  CL 97* 96*  --  95*  --  99 99 98  CO2 28 25  --  28  --  29 27 25   GLUCOSE 104* 92  --  111*  --  111* 122* 112*  BUN 8 7  --  6  --  7 8 11   CREATININE 0.76 0.83  --  0.72  --  0.65 0.69 0.95  CALCIUM 8.4* 8.2*  --  8.5*  --  8.7* 8.7*  9.1  MG 1.9  --  2.4 2.1 2.0 1.8 2.2  --   PHOS 3.4 2.7  --  3.1  --  2.7 3.7  --    Liver Function Tests: Recent Labs  Lab 06/23/23 0441 06/24/23 0532 06/25/23 0507  ALBUMIN 3.0* 2.9* 3.2*   No results for input(s): "LIPASE", "AMYLASE" in the last 168 hours. No results for input(s): "AMMONIA" in the last 168 hours. CBC: Recent Labs  Lab 06/25/23 0507 06/26/23 0425 06/27/23 0415 06/28/23 1420 06/29/23 1252  WBC 16.5* 15.3* 14.2* 16.2* 16.3*  NEUTROABS  --   --   --  9.5*  --   HGB 12.7* 11.9* 12.4* 12.7* 12.6*  HCT 37.2* 35.6* 37.4* 38.3* 37.5*  MCV 87.1 87.7 90.3 89.5 88.2  PLT 413* 520* 658* 913* 1,035*   Cardiac Enzymes: No results for input(s): "CKTOTAL", "CKMB", "CKMBINDEX", "TROPONINI" in the last 168 hours. BNP: Invalid input(s): "POCBNP" CBG: Recent Labs  Lab 06/25/23 2313 06/26/23 0846 06/26/23 1617 06/26/23 2312 06/27/23 0728  GLUCAP 98 92 110* 126* 112*   D-Dimer No results for input(s): "DDIMER" in the last 72 hours. Hgb A1c No results for input(s): "HGBA1C" in the last 72 hours. Lipid Profile No results for input(s): "CHOL", "HDL", "LDLCALC", "TRIG", "CHOLHDL", "LDLDIRECT" in the last 72 hours. Thyroid function studies No results for input(s): "TSH", "T4TOTAL", "T3FREE", "THYROIDAB" in the last 72 hours.  Invalid input(s): "FREET3" Anemia work up No results for input(s): "VITAMINB12", "FOLATE", "FERRITIN", "TIBC", "IRON", "RETICCTPCT" in the last 72 hours. Urinalysis    Component Value Date/Time   COLORURINE YELLOW 06/24/2023 1840   APPEARANCEUR CLOUDY (A) 06/24/2023 1840   LABSPEC >1.030 (H) 06/24/2023 1840   PHURINE 5.5 06/24/2023 1840   GLUCOSEU NEGATIVE 06/24/2023 1840   HGBUR TRACE (A) 06/24/2023 1840   BILIRUBINUR SMALL (A) 06/24/2023 1840   KETONESUR 15 (A)  06/24/2023 1840   PROTEINUR 30 (A) 06/24/2023 1840   NITRITE NEGATIVE 06/24/2023 1840   LEUKOCYTESUR NEGATIVE 06/24/2023 1840   Sepsis Labs Recent Labs  Lab 06/26/23 0425  06/27/23 0415 06/28/23 1420 06/29/23 1252  WBC 15.3* 14.2* 16.2* 16.3*   Microbiology Recent Results (from the past 240 hours)  Culture, blood (Routine X 2) w Reflex to ID Panel     Status: None   Collection Time: 06/24/23  5:38 AM   Specimen: BLOOD  Result Value Ref Range Status   Specimen Description BLOOD BLOOD LEFT ARM  Final   Special Requests   Final    BOTTLES DRAWN AEROBIC AND ANAEROBIC Blood Culture adequate volume   Culture   Final    NO GROWTH 5 DAYS Performed at [facility], [address] [place], [place] [address]    Report Status 06/29/2023 FINAL  Final  Culture, blood (Routine X 2) w Reflex to ID Panel     Status: None   Collection Time: 06/24/23  5:40 AM   Specimen: BLOOD  Result Value Ref Range Status   Specimen Description BLOOD BLOOD LEFT HAND  Final   Special Requests   Final    BOTTLES DRAWN AEROBIC AND ANAEROBIC Blood Culture adequate volume   Culture   Final    NO GROWTH 5 DAYS Performed at [facility], [address] [place], [place] [address]    Report Status 06/29/2023 FINAL  Final  Gastrointestinal Panel by PCR , Stool     Status: None   Collection Time: 06/25/23  7:20 PM  Result Value Ref Range Status   Campylobacter species NOT DETECTED NOT DETECTED Final   Plesimonas shigelloides NOT DETECTED NOT DETECTED Final   Salmonella species NOT DETECTED NOT DETECTED Final   Yersinia enterocolitica NOT DETECTED NOT DETECTED Final   Vibrio species NOT DETECTED NOT DETECTED Final   Vibrio cholerae NOT DETECTED NOT DETECTED Final   Enteroaggregative E coli (EAEC) NOT DETECTED NOT DETECTED Final   Enteropathogenic E coli (EPEC) NOT DETECTED NOT DETECTED Final   Enterotoxigenic E coli (ETEC) NOT DETECTED NOT DETECTED Final   Shiga like toxin producing E coli (STEC) NOT DETECTED NOT DETECTED Final   Shigella/Enteroinvasive E coli (EIEC) NOT DETECTED NOT DETECTED Final   Cryptosporidium NOT DETECTED NOT DETECTED Final   Cyclospora  cayetanensis NOT DETECTED NOT DETECTED Final   Entamoeba histolytica NOT DETECTED NOT DETECTED Final   Giardia lamblia NOT DETECTED NOT DETECTED Final   Adenovirus F40/41 NOT DETECTED NOT DETECTED Final   Astrovirus NOT DETECTED NOT DETECTED Final   Norovirus GI/GII NOT DETECTED NOT DETECTED Final   Rotavirus A NOT DETECTED NOT DETECTED Final   Sapovirus (I, II, IV, and V) NOT DETECTED NOT DETECTED Final    Comment: Performed at [facility], [address] [place], [place] [address]  C Difficile Quick Screen (NO PCR Reflex)     Status: None   Collection Time: 06/28/23  5:24 PM   Specimen: STOOL  Result Value Ref Range Status   C Diff antigen NEGATIVE NEGATIVE Final   C Diff toxin NEGATIVE NEGATIVE Final   C Diff interpretation No C. difficile detected.  Final    Comment: Performed at [facility], [address] [place], [place] [address]     Time coordinating discharge: Over 30 minutes  SIGNED:   Charise Killian, MD  [facility] 06/29/2023, 2:58 PM Pager   If 7PM-7AM, please contact night-coverage [email or web address]

## 2023-06-29 NOTE — Progress Notes (Addendum)
Occupational Therapy Treatment Patient Details Name: Ryan Miranda MRN: [id number] DOB: 03-Mar-1976 Today's Date: 06/29/2023   History of present illness Ryan Miranda is a 47 y.o. male with medical history significant for essential hypertension, depression, OSA on CPAP and alcohol abuse, presented to the emergency room with acute onset of suicidal ideation and on 11/29 which have been recurrent lately and got worse over the last 3 days. During his ER stay he became significantly anxious and tachycardic and hypertensive, developed alcohol withdrawal with hallucination and delirium which required admission.   OT comments  Pt seen for OT treatment on this date. Upon arrival to room pt seated in the chair, agreeable to tx. Pt completed Pill Box Test with a passing score. The Pill Box test assesses a pt's ability to accurately follow common medication bottle instructions to fill a 1 week pill box. It assesses a pt's ability to plan, self-monitor, volition, and executive function. Pt made one error taking longer than 5 minutes to complete task. Pt completed a functional standing balance task, noted pt good dynamic standing balance. Pt has met goals, no further acute OT needs identified. Pt agreeable OT signing off.      If plan is discharge home, recommend the following:  Assist for transportation;Assistance with cooking/housework   Equipment Recommendations  None recommended by OT    Recommendations for Other Services      Precautions / Restrictions Precautions Precautions: Fall Restrictions Weight Bearing Restrictions Per Provider Order: No       Mobility Bed Mobility                 Patient Response: Cooperative  Transfers Overall transfer level: Needs assistance Equipment used: None Transfers: Sit to/from Stand Sit to Stand: Supervision                 Balance Overall balance assessment: Needs assistance Sitting-balance support: No upper  extremity supported, Feet supported Sitting balance-Leahy Scale: Normal     Standing balance support: No upper extremity supported, During functional activity Standing balance-Leahy Scale: Good                             ADL either performed or assessed with clinical judgement   ADL Overall ADL's : Needs assistance/impaired                                       General ADL Comments: Pt completed the pill box test. Pt completed a functional standing balance task, noted pt good dynamic standing balance.    Extremity/Trunk Assessment Upper Extremity Assessment Upper Extremity Assessment: Overall WFL for tasks assessed   Lower Extremity Assessment Lower Extremity Assessment: Generalized weakness        Vision       Perception     Praxis      Cognition Arousal: Alert Behavior During Therapy: WFL for tasks assessed/performed Overall Cognitive Status: Within Functional Limits for tasks assessed                                 General Comments: Pt is alert and seems to have good insight of current situation and POC going forward. one on one sitter in room        Exercises      Shoulder Instructions  General Comments      Pertinent Vitals/ Pain       Pain Assessment Pain Assessment: No/denies pain  Home Living                                          Prior Functioning/Environment              Frequency  Min 1X/week        Progress Toward Goals  OT Goals(current goals can now be found in the care plan section)  Progress towards OT goals: Progressing toward goals     Plan      Co-evaluation                 AM-PAC OT "6 Clicks" Daily Activity     Outcome Measure   Help from another person eating meals?: None Help from another person taking care of personal grooming?: None Help from another person toileting, which includes using toliet, bedpan, or urinal?: A Little Help  from another person bathing (including washing, rinsing, drying)?: A Little Help from another person to put on and taking off regular upper body clothing?: None Help from another person to put on and taking off regular lower body clothing?: A Little 6 Click Score: 21    End of Session    OT Visit Diagnosis: Unsteadiness on feet (R26.81);Other abnormalities of gait and mobility (R26.89);Muscle weakness (generalized) (M62.81)   Activity Tolerance Patient tolerated treatment well   Patient Left in chair;with call bell/phone within reach;with nursing/sitter in room   Nurse Communication          Time: 4098-1191 OT Time Calculation (min): 21 min  Charges: OT General Charges $OT Visit: 1 Visit OT Treatments $Therapeutic Activity: 8-22 mins  Butch Penny, SOT

## 2023-06-29 NOTE — Plan of Care (Signed)
  Problem: Education: Goal: Knowledge of General Education information will improve Description: Including pain rating scale, medication(s)/side effects and non-pharmacologic comfort measures Outcome: Progressing   Problem: Health Behavior/Discharge Planning: Goal: Ability to manage health-related needs will improve Outcome: Progressing   Problem: Clinical Measurements: Goal: Diagnostic test results will improve Outcome: Progressing Goal: Respiratory complications will improve Outcome: Progressing Goal: Cardiovascular complication will be avoided Outcome: Progressing   Problem: Nutrition: Goal: Adequate nutrition will be maintained Outcome: Progressing   Problem: Coping: Goal: Level of anxiety will decrease Outcome: Progressing   Problem: Elimination: Goal: Will not experience complications related to bowel motility Outcome: Progressing Goal: Will not experience complications related to urinary retention Outcome: Progressing   Problem: Pain Management: Goal: General experience of comfort will improve Outcome: Progressing   Problem: Skin Integrity: Goal: Risk for impaired skin integrity will decrease Outcome: Progressing

## 2023-06-29 NOTE — Plan of Care (Signed)

## 2023-06-30 ENCOUNTER — Other Ambulatory Visit: Payer: Self-pay

## 2023-06-30 ENCOUNTER — Inpatient Hospital Stay
Admission: AD | Admit: 2023-06-30 | Discharge: 2023-07-05 | DRG: 885 | Disposition: A | Discharge status: Home / Self Care | Payer: Managed Care, Other (non HMO) | Source: Intra-hospital | Attending: Psychiatry | Admitting: Psychiatry

## 2023-06-30 ENCOUNTER — Encounter: Payer: Self-pay | Admitting: Psychiatry

## 2023-06-30 DIAGNOSIS — Z635 Disruption of family by separation and divorce: Secondary | ICD-10-CM | POA: Diagnosis not present

## 2023-06-30 DIAGNOSIS — K219 Gastro-esophageal reflux disease without esophagitis: Secondary | ICD-10-CM | POA: Diagnosis present

## 2023-06-30 DIAGNOSIS — I10 Essential (primary) hypertension: Secondary | ICD-10-CM | POA: Diagnosis present

## 2023-06-30 DIAGNOSIS — G4700 Insomnia, unspecified: Secondary | ICD-10-CM | POA: Diagnosis present

## 2023-06-30 DIAGNOSIS — F29 Unspecified psychosis not due to a substance or known physiological condition: Secondary | ICD-10-CM | POA: Diagnosis present

## 2023-06-30 DIAGNOSIS — Y908 Blood alcohol level of 240 mg/100 ml or more: Secondary | ICD-10-CM | POA: Diagnosis present

## 2023-06-30 DIAGNOSIS — Z7982 Long term (current) use of aspirin: Secondary | ICD-10-CM | POA: Diagnosis not present

## 2023-06-30 DIAGNOSIS — F419 Anxiety disorder, unspecified: Secondary | ICD-10-CM | POA: Diagnosis present

## 2023-06-30 DIAGNOSIS — Z5986 Financial insecurity: Secondary | ICD-10-CM

## 2023-06-30 DIAGNOSIS — F1010 Alcohol abuse, uncomplicated: Secondary | ICD-10-CM | POA: Insufficient documentation

## 2023-06-30 DIAGNOSIS — F1020 Alcohol dependence, uncomplicated: Secondary | ICD-10-CM | POA: Diagnosis present

## 2023-06-30 DIAGNOSIS — Z8249 Family history of ischemic heart disease and other diseases of the circulatory system: Secondary | ICD-10-CM

## 2023-06-30 DIAGNOSIS — F331 Major depressive disorder, recurrent, moderate: Secondary | ICD-10-CM | POA: Diagnosis present

## 2023-06-30 DIAGNOSIS — Z9010 Acquired absence of unspecified breast and nipple: Secondary | ICD-10-CM | POA: Diagnosis not present

## 2023-06-30 DIAGNOSIS — Z79899 Other long term (current) drug therapy: Secondary | ICD-10-CM

## 2023-06-30 DIAGNOSIS — G4733 Obstructive sleep apnea (adult) (pediatric): Secondary | ICD-10-CM | POA: Diagnosis present

## 2023-06-30 DIAGNOSIS — E876 Hypokalemia: Secondary | ICD-10-CM | POA: Diagnosis present

## 2023-06-30 DIAGNOSIS — E871 Hypo-osmolality and hyponatremia: Secondary | ICD-10-CM | POA: Diagnosis present

## 2023-06-30 DIAGNOSIS — R45851 Suicidal ideations: Secondary | ICD-10-CM | POA: Diagnosis present

## 2023-06-30 LAB — CBC
HCT: 36.1 % — ABNORMAL LOW (ref 39.0–52.0)
Hemoglobin: 11.7 g/dL — ABNORMAL LOW (ref 13.0–17.0)
MCH: 29.4 pg (ref 26.0–34.0)
MCHC: 32.4 g/dL (ref 30.0–36.0)
MCV: 90.7 fL (ref 80.0–100.0)
Platelets: 1085 10*3/uL (ref 150–400)
RBC: 3.98 MIL/uL — ABNORMAL LOW (ref 4.22–5.81)
RDW: 14.8 % (ref 11.5–15.5)
WBC: 16.5 10*3/uL — ABNORMAL HIGH (ref 4.0–10.5)
nRBC: 0 % (ref 0.0–0.2)

## 2023-06-30 MED ORDER — MAGNESIUM HYDROXIDE 400 MG/5ML PO SUSP: 30.0000 mL | Freq: Every day | ORAL | Status: DC | PRN

## 2023-06-30 MED ORDER — ACETAMINOPHEN 325 MG PO TABS: 650.0000 mg | ORAL_TABLET | Freq: Four times a day (QID) | ORAL | Status: DC | PRN

## 2023-06-30 MED ORDER — ALUM & MAG HYDROXIDE-SIMETH 200-200-20 MG/5ML PO SUSP: 30.0000 mL | ORAL | Status: DC | PRN

## 2023-06-30 MED ORDER — QUETIAPINE FUMARATE 25 MG PO TABS
50.0000 mg | ORAL_TABLET | Freq: Two times a day (BID) | ORAL | Status: DC
  Administered 2023-06-30 – 2023-07-05 (×10): 50 mg via ORAL
  Filled 2023-06-30 (×11): qty 2

## 2023-06-30 MED ORDER — PHENOL 1.4 % MT LIQD: 1.0000 | OROMUCOSAL | Status: DC | PRN

## 2023-06-30 MED ORDER — THIAMINE MONONITRATE 100 MG PO TABS
100.0000 mg | ORAL_TABLET | Freq: Every day | ORAL | Status: DC
  Administered 2023-07-01 – 2023-07-05 (×5): 100 mg via ORAL
  Filled 2023-06-30 (×5): qty 1

## 2023-06-30 MED ORDER — SERTRALINE HCL 25 MG PO TABS
50.0000 mg | ORAL_TABLET | Freq: Every day | ORAL | Status: DC
  Administered 2023-07-01 – 2023-07-05 (×5): 50 mg via ORAL
  Filled 2023-06-30 (×5): qty 2

## 2023-06-30 MED ORDER — PANTOPRAZOLE SODIUM 40 MG PO TBEC
40.0000 mg | DELAYED_RELEASE_TABLET | Freq: Every day | ORAL | Status: DC
  Administered 2023-07-01 – 2023-07-04 (×4): 40 mg via ORAL
  Filled 2023-06-30 (×5): qty 1

## 2023-06-30 MED ORDER — HYDROXYZINE HCL 25 MG PO TABS: 25.0000 mg | ORAL_TABLET | Freq: Three times a day (TID) | ORAL | Status: DC | PRN

## 2023-06-30 MED ORDER — ASPIRIN 81 MG PO TBEC
81.0000 mg | DELAYED_RELEASE_TABLET | Freq: Every day | ORAL | Status: DC
  Administered 2023-07-01 – 2023-07-05 (×5): 81 mg via ORAL
  Filled 2023-06-30 (×5): qty 1

## 2023-06-30 MED ORDER — ACETAMINOPHEN 650 MG RE SUPP: 650.0000 mg | Freq: Four times a day (QID) | RECTAL | Status: DC | PRN

## 2023-06-30 MED ORDER — OLANZAPINE 10 MG PO TBDP: 10.0000 mg | ORAL_TABLET | Freq: Three times a day (TID) | ORAL | Status: DC | PRN

## 2023-06-30 MED ORDER — TRAZODONE HCL 50 MG PO TABS
25.0000 mg | ORAL_TABLET | Freq: Every evening | ORAL | Status: DC | PRN
  Filled 2023-06-30: qty 1

## 2023-06-30 MED ORDER — ADULT MULTIVITAMIN W/MINERALS CH
1.0000 | ORAL_TABLET | Freq: Every day | ORAL | Status: DC
  Administered 2023-07-01 – 2023-07-05 (×5): 1 via ORAL
  Filled 2023-06-30 (×5): qty 1

## 2023-06-30 MED ORDER — BENZONATATE 100 MG PO CAPS: 200.0000 mg | ORAL_CAPSULE | Freq: Three times a day (TID) | ORAL | Status: DC | PRN

## 2023-06-30 MED ORDER — ORAL CARE MOUTH RINSE: 15.0000 mL | OROMUCOSAL | Status: DC | PRN

## 2023-06-30 NOTE — Plan of Care (Signed)
  Problem: Clinical Measurements: Goal: Will remain free from infection Outcome: Progressing Goal: Diagnostic test results will improve Outcome: Progressing Goal: Respiratory complications will improve Outcome: Progressing   Problem: Nutrition: Goal: Adequate nutrition will be maintained Outcome: Progressing   Problem: Coping: Goal: Level of anxiety will decrease Outcome: Progressing   Problem: Safety: Goal: Ability to remain free from injury will improve Outcome: Progressing   Problem: Skin Integrity: Goal: Risk for impaired skin integrity will decrease Outcome: Progressing

## 2023-06-30 NOTE — TOC Initial Note (Addendum)
Transition of Care [facility]) - Initial/Assessment Note    Patient Details  Name: Ryan Miranda MRN: [id number] Date of Birth: 1975/08/04  Transition of [facility]) CM/SW Contact:    Colette Ribas, LCSWA Phone Number: 06/30/2023, 10:50 AM  Clinical Narrative:                    CSW spoke with Nurse RW ordered. CSW reached out to [employer].     Durable Medical Equipment  (From admission, onward)           Start     Ordered   06/30/23 1057  For home use only DME Walker rolling  Once       Question Answer Comment  Walker: With 5 Inch Wheels   Patient needs a walker to treat with the following condition Generalized weakness      06/30/23 1056             11:20AM: Per nurse PT did not recommend RW. I cancelled the order with [employer].       Patient Goals and CMS Choice            Expected Discharge Plan and Services         Expected Discharge Date: 06/29/23                                    Prior Living Arrangements/Services                       Activities of Daily Living      Permission Sought/Granted                  Emotional Assessment              Admission diagnosis:  Alcohol withdrawal (HCC) [F10.939] Suicidal ideation [R45.851] Alcohol withdrawal delirium, acute, hyperactive (HCC) [F10.931] Patient Active Problem List   Diagnosis Date Noted   Diarrhea of presumed infectious origin 06/28/2023   Alcohol withdrawal delirium, acute, hyperactive (HCC) 06/28/2023   Fever 06/26/2023   Hypomagnesemia 06/20/2023   Hypophosphatemia 06/20/2023   SBO (small bowel obstruction) (HCC) 06/20/2023   Depression with suicidal ideation 06/17/2023   GERD without esophagitis 06/17/2023   Delirium tremens (HCC) 06/17/2023   Leukocytosis 06/17/2023   Hypokalemia 06/17/2023   Hyponatremia 06/17/2023   Suicidal ideation 06/17/2023   Iron overload 02/06/2019   PCP:  Danella Penton, MD Pharmacy:   [id number] [place], [place] - [address] AT [facility] [address] [facility] [address] [place] [address] Phone: [phone] Fax: [phone]     Social Drivers of Health (SDOH) Social History: SDOH Screenings   Food Insecurity: No Food Insecurity (06/17/2023)  Recent Concern: Food Insecurity - Food Insecurity Present (03/27/2023)   Received from [facility]  Housing: Low Risk  (06/17/2023)  Recent Concern: Housing - High Risk (03/27/2023)   Received from [facility]  Transportation Needs: Patient Unable To Answer (06/21/2023)  Utilities: Patient Unable To Answer (06/21/2023)  Recent Concern: Utilities - At Risk (03/27/2023)   Received from [facility]  Financial Resource Strain: High Risk (03/27/2023)   Received from [facility]  Physical Activity: Patient Unable To Answer (03/27/2023)   Received from [facility]  Social Connections: Unknown (03/27/2023)   Received from  [facility]  Stress: Stress Concern Present (03/27/2023)   Received from [facility]  Tobacco Use: Low Risk  (04/11/2023)   Received from [facility]  Health Literacy: Adequate Health Literacy (03/27/2023)   Received from [facility]   SDOH Interventions: Transportation Interventions: Intervention Not Indicated Utilities Interventions: Patient Unable to Answer   Readmission Risk Interventions     No data to display

## 2023-06-30 NOTE — Progress Notes (Signed)
Admission Note:  14 yr Caucasian male who presents IVC, was transferred from [facility] ICU after an episode of DT's. Pt did not present in any acute distress for the treatment of Depression. Pt appears flat, mood animated and he was pleasant, cooperative and candid during the interview. Pt denied SI, plan or intent, negative for A/V/H. Pt reports a long hx. ETOH abuse, drinking up to a fifth of whiskey daily prior to been admitted on 06/17/23. Pt stated he had 2 yrs. sobriety and his recent triggered is divorce from his wife. Pt denied having any pertinent health issues just periods of high blood pressure.  Skin was assessed and found to be clear of any abnormal marks apart from tattoos on chest and back. PT searched and no contraband found, POC and unit policies explained and understanding verbalized. Consents obtained. Food and fluids offered, and  accepted. Pt had no additional questions or concerns, was placed on q 15 min rounds for safety and support.

## 2023-06-30 NOTE — Group Note (Signed)
Date:  06/30/2023 Time:  10:07 PM  Group Topic/Focus:  Wrap-Up Group:   The focus of this group is to help patients review their daily goal of treatment and discuss progress on daily workbooks.    Participation Level:  Did Not Attend  Maglione,Elery Cadenhead E 06/30/2023, 10:07 PM

## 2023-07-01 DIAGNOSIS — F331 Major depressive disorder, recurrent, moderate: Principal | ICD-10-CM | POA: Insufficient documentation

## 2023-07-01 DIAGNOSIS — F1010 Alcohol abuse, uncomplicated: Secondary | ICD-10-CM | POA: Insufficient documentation

## 2023-07-01 MED ORDER — TOPIRAMATE 25 MG PO TABS
25.0000 mg | ORAL_TABLET | Freq: Every day | ORAL | Status: DC
  Administered 2023-07-01 – 2023-07-03 (×3): 25 mg via ORAL
  Filled 2023-07-01 (×3): qty 1

## 2023-07-01 MED ORDER — MELATONIN 5 MG PO TABS
5.0000 mg | ORAL_TABLET | Freq: Every day | ORAL | Status: DC
  Administered 2023-07-01 – 2023-07-04 (×4): 5 mg via ORAL
  Filled 2023-07-01 (×4): qty 1

## 2023-07-01 NOTE — Group Note (Signed)
Date:  07/01/2023 Time:  8:41 PM  Group Topic/Focus:  Wrap-Up Group:   The focus of this group is to help patients review their daily goal of treatment and discuss progress on daily workbooks.    Participation Level:  Did Not Attend  Participation Quality:  Appropriate and Resistant  Affect:  Appropriate  Cognitive:  Alert  Insight: Limited  Engagement in Group:  None and Resistant  Modes of Intervention:  Discussion  Additional Comments:   Pt stayed in room when night time snack was called. Pt said he was not hungry at this time. Pt laying in the bed and said he was ok at this time.   Maglione,Nelsie Domino E 07/01/2023, 8:41 PM
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
## 2023-07-01 NOTE — Plan of Care (Signed)

## 2023-07-01 NOTE — [facility] Suicide Risk Assessment (Signed)
[facility] Admission Suicide Risk Assessment   Nursing information obtained from:  Patient Demographic factors:  Male Current Mental Status:  NA Loss Factors:  Loss of significant relationship Historical Factors:  NA Risk Reduction Factors:  Sense of responsibility to family, Employed, Positive therapeutic relationship, Positive social support  Total Time spent with patient: 1 hour Principal Problem: Major depressive disorder, recurrent episode, moderate (HCC) Diagnosis:  Principal Problem:   Major depressive disorder, recurrent episode, moderate (HCC) Active Problems:   ETOH abuse  Subjective Data: 47 year old Caucasian male admitted to [facility] following acute suicidal ideation and a significant episode of alcohol withdrawal. Upon admission, the patient reports, "binge drinking," describing periods of sobriety interspersed with heavy alcohol use throughout his adult life. He states that his most recent episode of binge drinking began after discovering his finalized divorce through an Child psychotherapist. He reports drinking daily, consuming approximately two pints of whiskey and several glasses of wine.The patient recalls sending a text message with a loaded firearm to his ex-wife with intent to use the weapon to end his life. He states his son intervened and stopped him. During the interview, he is tearful and expresses regret, saying he wants his family back. He denies current suicidal ideation (SI), homicidal ideation (HI), auditory/visual hallucinations (AVH), or any non-suicidal self-injurious behavior.The patient reports a history of therapy, most recently six months ago, but discontinued due to financial concerns and perceived lack of benefit. He recalls seeing a psychiatrist briefly at age 77 after his mother killed his father in self-defense. He has a positive family history of alcoholism and psychiatric issues.  Continued Clinical Symptoms:  Alcohol Use Disorder Identification Test Final Score (AUDIT):  31 The "Alcohol Use Disorders Identification Test", Guidelines for Use in Primary Care, Second Edition.  World Science writer [facility]). Score between 0-7:  no or low risk or alcohol related problems. Score between 8-15:  moderate risk of alcohol related problems. Score between 16-19:  high risk of alcohol related problems. Score 20 or above:  warrants further diagnostic evaluation for alcohol dependence and treatment.   CLINICAL FACTORS:   Depression:   Comorbid alcohol abuse/dependence Insomnia Alcohol/Substance Abuse/Dependencies   Musculoskeletal: Strength & Muscle Tone: within normal limits Gait & Station: normal Patient leans: N/A  Psychiatric Specialty Exam:  Presentation  General Appearance:  Appropriate for Environment; Neat  Eye Contact: Good  Speech: Clear and Coherent; Normal Rate  Speech Volume: Normal  Handedness: Right   Mood and Affect  Mood: Anxious  Affect: Appropriate; Congruent   Thought Process  Thought Processes: Coherent  Descriptions of Associations:Intact  Orientation:Full (Time, Place and Person)  Thought Content:Logical; WDL  History of Schizophrenia/Schizoaffective disorder:No  Duration of Psychotic Symptoms:6 days Hallucinations:Hallucinations: None  Ideas of Reference:None  Suicidal Thoughts:Suicidal Thoughts: No (only when I am drinking)  Homicidal Thoughts:Homicidal Thoughts: No   Sensorium  Memory: Immediate Good; Remote Poor  Judgment: Fair  Insight: Fair   Art therapist  Concentration: Good  Attention Span: Fair  Recall: Poor  Fund of Knowledge: Good  Language: Good   Psychomotor Activity  Psychomotor Activity:Psychomotor Activity: Tremor; Normal   Assets  Assets: Communication Skills; Financial Resources/Insurance; Housing   Sleep  Sleep:Sleep: Good Number of Hours of Sleep: 6    Physical Exam: Physical Exam Vitals and nursing note reviewed.  Constitutional:       Appearance: Normal appearance.  HENT:     Head: Normocephalic and atraumatic.     Nose: Nose normal.  Pulmonary:     Effort: Pulmonary  effort is normal.  Musculoskeletal:        General: Normal range of motion.     Cervical back: Normal range of motion.  Neurological:     General: No focal deficit present.     Mental Status: He is alert and oriented to person, place, and time. Mental status is at baseline.  Psychiatric:        Attention and Perception: Attention and perception normal.        Mood and Affect: Mood is anxious. Affect is flat.        Speech: Speech normal.        Behavior: Behavior normal. Behavior is cooperative.        Thought Content: Thought content normal.        Cognition and Memory: Cognition and memory normal.        Judgment: Judgment normal.    ROS Blood pressure 112/80, pulse 76, temperature 98.5 F (36.9 C), resp. rate (!) 22, height 5\' 10"  (1.778 m), weight 99.8 kg, SpO2 100%. Body mass index is 31.57 kg/m.   COGNITIVE FEATURES THAT CONTRIBUTE TO RISK:  None    SUICIDE RISK:   Minimal: No identifiable suicidal ideation.  Patients presenting with no risk factors but with morbid ruminations; may be classified as minimal risk based on the severity of the depressive symptoms  PLAN OF CARE:  Sertraline (Zoloft) 50 mg daily: For depression and anxiety. Quetiapine (Seroquel) 50 mg BID: For mood stabilization and insomnia. Trazodone 25 mg QHS PRN: For insomnia. Topiramate 25 mg daily: Initiate for mood stabilization and potential reduction of alcohol cravings. Engage the patient in group and individual therapy sessions to address coping strategies and relapse prevention. Provide psychoeducation about medication adherence and its role in mood stabilization Consult with an addiction specialist for comprehensive substance use management. I certify that inpatient services furnished can reasonably be expected to improve the patient's condition.   Myriam Forehand, NP 07/01/2023, 2:45 PM

## 2023-07-01 NOTE — [facility] Suicide Risk Assessment (Signed)
[facility] INPATIENT:  Family/Significant Other Suicide Prevention Education  Suicide Prevention Education:  Education Completed; Ryan Miranda, pt's wife [phone], has been identified by the patient as the family member/significant other with whom the patient will be residing, and identified as the person(s) who will aid the patient in the event of a mental health crisis (suicidal ideations/suicide attempt).  With written consent from the patient, the family member/significant other has been provided the following suicide prevention education, prior to the and/or following the discharge of the patient.  The suicide prevention education provided includes the following: Suicide risk factors Suicide prevention and interventions National Suicide Hotline telephone number [facility] assessment telephone number [facility] Emergency Assistance 911 [facility] and/or Residential Mobile Crisis Unit telephone number  Request made of family/significant other to: Remove weapons (e.g., guns, rifles, knives), all items previously/currently identified as safety concern.   Remove drugs/medications (over-the-counter, prescriptions, illicit drugs), all items previously/currently identified as a safety concern.  The family member/significant other verbalizes understanding of the suicide prevention education information provided.  The family member/significant other agrees to remove the items of safety concern listed above.  The LCSWA contacted the patient wife to provide suicide prevention information. The LCSWA confirmed with the wife that there was no guns or weapons in the home. The wife stated that she and her son had already taken the guns out of the home. She stated that he does live alone, so the LCSWA informed her of the importance of having someone to check in on the patient and being a non judgment support. She stated that he has her and her family to support him. The patient wife stated  that seeing his divorce posted in the [place] news triggered him to drinking and that he uses alcohol to cope. She stated that the patient likes therapy and wants to continue but it becomes expensive. They wanted to see about Medicaid to pay for therapy.    Marshell Levan 07/01/2023, 1:23 PM

## 2023-07-01 NOTE — Progress Notes (Signed)
   07/01/23 1700  Psych Admission Type (Psych Patients Only)  Admission Status Involuntary  Psychosocial Assessment  Patient Complaints Anxiety;Depression  Eye Contact Fair  Facial Expression Flat  Affect Flat  Speech Logical/coherent  Interaction Assertive  Motor Activity Slow  Appearance/Hygiene In scrubs;Unremarkable  Behavior Characteristics Cooperative;Appropriate to situation  Mood Depressed;Pleasant (patient reports this is because of "not being home with family".)  Aggressive Behavior  Effect No apparent injury  Thought Process  Coherency WDL  Content WDL  Delusions None reported or observed  Perception WDL  Hallucination None reported or observed  Judgment WDL  Confusion None  Danger to Self  Current suicidal ideation? Denies  Danger to Others  Danger to Others None reported or observed

## 2023-07-01 NOTE — H&P (Signed)
Psychiatric Admission Assessment Adult  Patient Identification: Ryan Miranda MRN:  [id number] Date of Evaluation:  07/01/2023 Chief Complaint:  Psychosis (HCC) [F29] Principal Diagnosis: Major depressive disorder, recurrent episode, moderate (HCC) Diagnosis:  Principal Problem:   Major depressive disorder, recurrent episode, moderate (HCC) Active Problems:   ETOH abuse  History of Present Illness: 47 year old Caucasian male admitted under IVC following an episode of acute suicidal ideation in the context of alcohol use. He reports "binge drinking" throughout his adult life, with periods of sobriety interspersed with heavy alcohol consumption. He states that his most recent binge began after discovering through an online search that his divorce had been finalized. He describes consuming approximately two pints of whiskey and a couple of glasses of wine daily during this period.The patient recalls sending a text message to his ex-wife, including a photo of a loaded firearm, and states he had intended to end his life. His son intervened and stopped him from carrying out the act. He was subsequently brought to the emergency department. During the assessment, he expressed remorse and tearfully reported, "I just want my family back." The patient denies current suicidal ideation (SI), homicidal ideation (HI), auditory or visual hallucinations (AVH), or paranoia. However, during this hospitalization, he stated, "I don't remember much since Saturday," indicating memory gaps potentially related to alcohol use and withdrawal.The patient has a history of depression, alcohol use disorder, and obstructive sleep apnea managed with CPAP. He denies any history of prior suicide attempts or inpatient psychiatric hospitalizations. He recalls seeing a psychiatrist at age 67 following his father's death in a domestic violence incident involving his mother. He has previously engaged in therapy, most recently six  months ago, but discontinued due to financial concerns and perceived lack of benefit.The patient reports a positive family psychiatric history, including parental alcoholism, abuse, and mental health issues. He is currently employed in the [employer] of [employer], where he has worked for 19 years. He denies using substances other than alcohol, including nicotine or illicit drugs.During his emergency department stay, the patient presented with alcohol withdrawal symptoms, including tachycardia, hypertension, and anxiety. He received IV phenobarbital, benzodiazepines, and supportive care. His lab results revealed hyponatremia, hypokalemia, and leukocytosis, which were treated appropriately. His alcohol level was 365 upon arrival. The patient was placed on CIWA protocol and managed for delirium tremens, which resolved with treatment.Currently, the patient remains alert, oriented x4, and cooperative. He continues to express motivation to "get better" and reconnect with his family, with no acute psychiatric or medical concerns. He is being evaluated for inpatient psychiatric care to address alcohol use disorder, depression, and the recent suicidal episode. Associated Signs/Symptoms: Depression Symptoms:  depressed mood, insomnia, feelings of worthlessness/guilt, hopelessness, suicidal thoughts without plan, disturbed sleep, (Hypo) Manic Symptoms:  Hallucinations, Impulsivity, Anxiety Symptoms:  Excessive Worry, Psychotic Symptoms:  Hallucinations: Auditory PTSD Symptoms: Negative Total Time spent with patient: 2 hours  Past Psychiatric History: Depression ETOH Abuse  Is the patient at risk to self? No.  Has the patient been a risk to self in the past 6 months? Yes.    Has the patient been a risk to self within the distant past? Yes.    Is the patient a risk to others? No.  Has the patient been a risk to others in the past 6 months? No.  Has the patient been a risk to others within the  distant past? No.   [place] Scale:  Flowsheet Row Admission (Current) from 06/30/2023 in [facility] INPATIENT [facility]HAVIORAL MEDICINE  ED to Hosp-Admission (Discharged) from 06/15/2023 in [facility] ICU/CCU  C-SSRS RISK CATEGORY Error: Question 2 not populated High Risk        Prior Inpatient Therapy: Yes.   If yes, describe admission for DT  Prior Outpatient Therapy: Yes.   If yes, describe AA and Outpatient   Alcohol Screening: 1. How often do you have a drink containing alcohol?: 4 or more times a week 2. How many drinks containing alcohol do you have on a typical day when you are drinking?: 7, 8, or 9 3. How often do you have six or more drinks on one occasion?: Daily or almost daily AUDIT-C Score: 11 4. How often during the last year have you found that you were not able to stop drinking once you had started?: Daily or almost daily 5. How often during the last year have you failed to do what was normally expected from you because of drinking?: Daily or almost daily 6. How often during the last year have you needed a first drink in the morning to get yourself going after a heavy drinking session?: Daily or almost daily 7. How often during the last year have you had a feeling of guilt of remorse after drinking?: Daily or almost daily 8. How often during the last year have you been unable to remember what happened the night before because you had been drinking?: Daily or almost daily 9. Have you or someone else been injured as a result of your drinking?: No 10. Has a relative or friend or a doctor or another health worker been concerned about your drinking or suggested you cut down?: No Alcohol Use Disorder Identification Test Final Score (AUDIT): 31 Alcohol Brief Interventions/Follow-up: Alcohol education/Brief advice Substance Abuse History in the last 12 months:  Yes.   Consequences of Substance Abuse: Medical Consequences:  chronic cough Legal Consequences:  loss of  position DT's: psychosis Withdrawal Symptoms:   Cramps Diarrhea Headaches Nausea Tremors Vomiting Previous Psychotropic Medications: Yes  Psychological Evaluations: Yes  Past Medical History:  Past Medical History:  Diagnosis Date   Depression    Elevated ferritin level    Elevated LFTs    Hypertension    Iron overload 02/06/2019   Laryngopharyngeal reflux (LPR)    OSA on CPAP    Tremors of nervous system    Unintentional weight loss     Past Surgical History:  Procedure Laterality Date   BREAST EXCISIONAL BIOPSY Left 1993   neg   COLONOSCOPY     MASTECTOMY PARTIAL / LUMPECTOMY     Family History:  Family History  Problem Relation Age of Onset   Cancer Mother    Hypertension Father    Cancer Maternal Aunt    Cancer Maternal Grandfather    Family Psychiatric  History: none reported Tobacco Screening:  Social History   Tobacco Use  Smoking Status Never  Smokeless Tobacco Never    BH Tobacco Counseling     Are you interested in Tobacco Cessation Medications?  No value filed. Counseled patient on smoking cessation:  No value filed. Reason Tobacco Screening Not Completed: No value filed.       Social History:  Social History   Substance and Sexual Activity  Alcohol Use Not Currently   Alcohol/week: 0.0 standard drinks of alcohol     Social History   Substance and Sexual Activity  Drug Use Not Currently    Additional Social History: Marital status: Divorced Divorced, when?: Divorced Aug 2024. Separated  since Feb 2021 What types of issues is patient dealing with in the relationship?: None reported Does patient have children?: Yes How many children?: 1 How is patient's relationship with their children?: Reports it's a good relationship                         Allergies:  No Known Allergies Lab Results:  Results for orders placed or performed during the hospital encounter of 06/15/23 (from the past 48 hours)  CBC     Status: Abnormal    Collection Time: 06/30/23 12:27 PM  Result Value Ref Range   WBC 16.5 (H) 4.0 - 10.5 K/uL   RBC 3.98 (L) 4.22 - 5.81 MIL/uL   Hemoglobin 11.7 (L) 13.0 - 17.0 g/dL   HCT 16.1 (L) 09.6 - 04.5 %   MCV 90.7 80.0 - 100.0 fL   MCH 29.4 26.0 - 34.0 pg   MCHC 32.4 30.0 - 36.0 g/dL   RDW 40.9 81.1 - 91.4 %   Platelets 1,085 (HH) 150 - 400 K/uL    Comment: CRITICAL VALUE NOTED.  VALUE IS CONSISTENT WITH PREVIOUSLY REPORTED AND CALLED VALUE.   nRBC 0.0 0.0 - 0.2 %    Comment: Performed at [facility], [address] [place], [place] [address]    Blood Alcohol level:  Lab Results  Component Value Date   ETH [facility]) 06/15/2023    Metabolic Disorder Labs:  No results found for: "HGBA1C", "MPG" No results found for: "PROLACTIN" Lab Results  Component Value Date   TRIG 119 06/25/2023    Current Medications: Current Facility-Administered Medications  Medication Dose Route Frequency Provider Last Rate Last Admin   acetaminophen (TYLENOL) tablet 650 mg  650 mg Oral Q6H PRN Myriam Forehand, NP       Or   acetaminophen (TYLENOL) suppository 650 mg  650 mg Rectal Q6H PRN Myriam Forehand, NP       alum & mag hydroxide-simeth (MAALOX/MYLANTA) 200-200-20 MG/5ML suspension 30 mL  30 mL Oral Q4H PRN Myriam Forehand, NP       aspirin EC tablet 81 mg  81 mg Oral Daily Myriam Forehand, NP   81 mg at 07/01/23 0901   benzonatate (TESSALON) capsule 200 mg  200 mg Oral TID PRN Myriam Forehand, NP       hydrOXYzine (ATARAX) tablet 25 mg  25 mg Oral TID PRN Myriam Forehand, NP       magnesium hydroxide (MILK OF MAGNESIA) suspension 30 mL  30 mL Oral Daily PRN Myriam Forehand, NP       melatonin tablet 5 mg  5 mg Oral QHS Myriam Forehand, NP       multivitamin with minerals tablet 1 tablet  1 tablet Oral Daily Myriam Forehand, NP   1 tablet at 07/01/23 0902   OLANZapine zydis (ZYPREXA) disintegrating tablet 10 mg  10 mg Oral Q8H PRN Myriam Forehand, NP       Oral care mouth rinse  15 mL Mouth Rinse PRN Myriam Forehand, NP       pantoprazole (PROTONIX) EC tablet 40 mg  40 mg Oral QHS Myriam Forehand, NP       phenol (CHLORASEPTIC) mouth spray 1 spray  1 spray Mouth/Throat PRN Myriam Forehand, NP       QUEtiapine (SEROQUEL) tablet 50 mg  50 mg Oral BID Myriam Forehand, NP   50 mg  at 07/01/23 0901   sertraline (ZOLOFT) tablet 50 mg  50 mg Oral Daily Myriam Forehand, NP   50 mg at 07/01/23 0901   thiamine (VITAMIN B1) tablet 100 mg  100 mg Oral Daily Myriam Forehand, NP   100 mg at 07/01/23 0901   topiramate (TOPAMAX) tablet 25 mg  25 mg Oral Daily Myriam Forehand, NP       traZODone (DESYREL) tablet 25 mg  25 mg Oral QHS PRN Myriam Forehand, NP       PTA Medications: Medications Prior to Admission  Medication Sig Dispense Refill Last Dose/Taking   amitriptyline (ELAVIL) 10 MG tablet Take 10 mg by mouth at bedtime.      aspirin EC 81 MG tablet Take 1 tablet (81 mg total) by mouth daily. Swallow whole.      pantoprazole (PROTONIX) 40 MG tablet Take 40 mg by mouth daily.      QUEtiapine (SEROQUEL) 50 MG tablet Take 1 tablet (50 mg total) by mouth 2 (two) times daily.      sertraline (ZOLOFT) 50 MG tablet Take 50 mg by mouth daily.       Musculoskeletal: Strength & Muscle Tone: within normal limits Gait & Station: normal Patient leans: N/A            Psychiatric Specialty Exam:  Presentation  General Appearance:  Appropriate for Environment; Neat  Eye Contact: Good  Speech: Clear and Coherent; Normal Rate  Speech Volume: Normal  Handedness: Right   Mood and Affect  Mood: Anxious  Affect: Appropriate; Congruent   Thought Process  Thought Processes: Coherent  Duration of Psychotic Symptoms:N/A Past Diagnosis of Schizophrenia or Psychoactive disorder: No  Descriptions of Associations:Intact  Orientation:Full (Time, Place and Person)  Thought Content:Logical; WDL  Hallucinations:Hallucinations: None  Ideas of Reference:None  Suicidal Thoughts:Suicidal Thoughts: No (only when  I am drinking)  Homicidal Thoughts:Homicidal Thoughts: No   Sensorium  Memory: Immediate Good; Remote Poor  Judgment: Fair  Insight: Fair   Art therapist  Concentration: Good  Attention Span: Fair  Recall: Poor  Fund of Knowledge: Good  Language: Good   Psychomotor Activity  Psychomotor Activity: Psychomotor Activity: Tremor; Normal   Assets  Assets: Communication Skills; Financial Resources/Insurance; Housing   Sleep  Sleep: Sleep: Good Number of Hours of Sleep: 6    Physical Exam: Physical Exam Vitals and nursing note reviewed.  Constitutional:      Appearance: Normal appearance.  HENT:     Head: Normocephalic and atraumatic.     Nose: Nose normal.  Pulmonary:     Effort: Pulmonary effort is normal.  Musculoskeletal:        General: Normal range of motion.     Cervical back: Normal range of motion.  Neurological:     General: No focal deficit present.     Mental Status: He is alert and oriented to person, place, and time.  Psychiatric:        Attention and Perception: Attention and perception normal.        Mood and Affect: Mood is anxious. Affect is flat.        Speech: Speech normal.        Behavior: Behavior normal. Behavior is cooperative.        Thought Content: Thought content normal.        Cognition and Memory: Cognition and memory normal.        Judgment: Judgment normal.    Review of Systems  Psychiatric/Behavioral:  Positive for depression and substance abuse. The patient is nervous/anxious.   All other systems reviewed and are negative.  Blood pressure 112/80, pulse 76, temperature 98.5 F (36.9 C), resp. rate (!) 22, height 5\' 10"  (1.778 m), weight 99.8 kg, SpO2 100%. Body mass index is 31.57 kg/m.  Treatment Plan Summary: Daily contact with patient to assess and evaluate symptoms and progress in treatment and Medication management Sertraline (Zoloft) 50 mg daily: For depression and anxiety. Quetiapine  (Seroquel) 50 mg BID: For mood stabilization and insomnia. Trazodone 25 mg QHS PRN: For insomnia. Topiramate 25 mg daily: Initiate for mood stabilization and potential reduction of alcohol cravings. Engage the patient in group and individual therapy sessions to address coping strategies and relapse prevention. Provide psychoeducation about medication adherence and its role in mood stabilization Consult with an addiction specialist for comprehensive substance use management. Observation Level/Precautions:  Continuous Observation Detox 15 minute checks Seizure  Laboratory:   none  Psychotherapy:    Medications:    Consultations:    Discharge Concerns:    Estimated LOS:  Other:     Physician Treatment Plan for Primary Diagnosis: Major depressive disorder, recurrent episode, moderate (HCC) Long Term Goal(s): Improvement in symptoms so as ready for discharge  Short Term Goals: Ability to identify changes in lifestyle to reduce recurrence of condition will improve, Ability to verbalize feelings will improve, Ability to disclose and discuss suicidal ideas, Ability to demonstrate self-control will improve, Ability to identify and develop effective coping behaviors will improve, Ability to maintain clinical measurements within normal limits will improve, Compliance with prescribed medications will improve, and Ability to identify triggers associated with substance abuse/mental health issues will improve  Physician Treatment Plan for Secondary Diagnosis: Principal Problem:   Major depressive disorder, recurrent episode, moderate (HCC) Active Problems:   ETOH abuse  Long Term Goal(s): Improvement in symptoms so as ready for discharge  Short Term Goals: Ability to identify changes in lifestyle to reduce recurrence of condition will improve, Ability to verbalize feelings will improve, Ability to disclose and discuss suicidal ideas, Ability to demonstrate self-control will improve, Ability to  identify and develop effective coping behaviors will improve, Ability to maintain clinical measurements within normal limits will improve, Compliance with prescribed medications will improve, and Ability to identify triggers associated with substance abuse/mental health issues will improve  I certify that inpatient services furnished can reasonably be expected to improve the patient's condition.    Myriam Forehand, NP 12/15/20242:51 PM

## 2023-07-01 NOTE — Progress Notes (Signed)
Pt calm and pleasant during assessment denying SI/HI/AVH. Pt endorses anxiety and depression. Pt observed interacting appropriately with staff and peers on the unit. Pt compliant with medication administration per MD orders. Pt given education, support, and encouragement to be active in his treatment plan. Pt being monitored Q 15 minutes for safety per unit protocol, remains safe on the unit

## 2023-07-01 NOTE — Group Note (Signed)
Date:  07/01/2023 Time:  11:46 AM  Group Topic/Focus:  Spirituality:   The focus of this group is to discuss how one's spirituality can aide in recovery.    Participation Level:  Did Not Attend   Ryan Miranda 07/01/2023, 11:46 AM

## 2023-07-02 DIAGNOSIS — F331 Major depressive disorder, recurrent, moderate: Secondary | ICD-10-CM

## 2023-07-02 NOTE — Group Note (Signed)
Date:  07/02/2023 Time:  9:38 PM  Group Topic/Focus:  Wrap-Up Group:   The focus of this group is to help patients review their daily goal of treatment and discuss progress on daily workbooks.    Participation Level:  Active  Participation Quality:  Appropriate and Attentive  Affect:  Appropriate and Resistant  Cognitive:  Alert and Appropriate  Insight: Appropriate  Engagement in Group:  Limited and Resistant  Modes of Intervention:  Discussion  Additional Comments:     Maglione,Shanta Dorvil E 07/02/2023, 9:38 PM

## 2023-07-02 NOTE — Group Note (Signed)
Recreation Therapy Group Note   Group Topic:General Recreation  Group Date: 07/02/2023 Start Time: 1530 End Time: 1645 Facilitators: Rosina Lowenstein, LRT, CTRS Location:  Dayroom  Group Description: Recreation. Patients were given the opportunity to play cards, journal, or listen to music during group. Pt identified and conversated about things they enjoy doing in their free time and how they can continue to do that outside of the hospital.  Goal Area(s) Addressed: Patient will practice making a positive decision. Patient will have the opportunity to try a new leisure activity. Patient will communicate with peers and LRT.   Affect/Mood: N/A   Participation Level: Did not attend    Clinical Observations/Individualized Feedback: Ryan Miranda did not attend group.   Plan: Continue to engage patient in RT group sessions 2-3x/week.   Rosina Lowenstein, LRT, CTRS 07/02/2023 5:19 PM

## 2023-07-02 NOTE — Group Note (Signed)
Recreation Therapy Group Note   Group Topic:Coping Skills  Group Date: 07/02/2023 Start Time: 1000 End Time: 1100 Facilitators: Rosina Lowenstein, LRT, CTRS Location:  Craft Room  Group Description: Mind Map.  Patient was provided a blank template of a diagram with 32 blank boxes in a tiered system, branching from the center (similar to a bubble chart). LRT directed patients to label the middle of the diagram "Coping Skills". LRT and patients then came up with 8 different coping skills as examples. Pt were directed to record their coping skills in the 2nd tier boxes closest to the center.  Patients would then share their coping skills with the group as LRT wrote them out. LRT gave a handout of 99 different coping skills at the end of group.   Goal Area(s) Addressed: Patients will be able to define "coping skills". Patient will identify new coping skills.  Patient will increase communication.   Affect/Mood: N/A   Participation Level: Did not attend    Clinical Observations/Individualized Feedback: Ryan Miranda did not attend group.   Plan: Continue to engage patient in RT group sessions 2-3x/week.   Rosina Lowenstein, LRT, CTRS 07/02/2023 12:59 PM

## 2023-07-02 NOTE — Progress Notes (Signed)
Pt calm and pleasant during assessment denying SI/HI/AVH. Pt endorses anxiety and depression. Pt observed interacting appropriately with staff and peers on the unit. Pt compliant with medication administration per MD orders. Pt given education, support, and encouragement to be active in his treatment plan. Pt being monitored Q 15 minutes for safety per unit protocol, remains safe on the unit

## 2023-07-02 NOTE — Plan of Care (Signed)
Patient appropriate with staff & peers. Patient verbalized that he is feeling better.Denies SI,HI and AVH. Appetite and energy level good. Support and encouragement given.

## 2023-07-02 NOTE — BH IP Treatment Plan (Signed)
Interdisciplinary Treatment and Diagnostic Plan Update  07/02/2023 Time of Session: 09:40 Ryan Miranda MRN: [id number]  Principal Diagnosis: Major depressive disorder, recurrent episode, moderate (HCC)  Secondary Diagnoses: Principal Problem:   Major depressive disorder, recurrent episode, moderate (HCC) Active Problems:   ETOH abuse   Current Medications:  Current Facility-Administered Medications  Medication Dose Route Frequency Provider Last Rate Last Admin   acetaminophen (TYLENOL) tablet 650 mg  650 mg Oral Q6H PRN Myriam Forehand, NP       Or   acetaminophen (TYLENOL) suppository 650 mg  650 mg Rectal Q6H PRN Myriam Forehand, NP       alum & mag hydroxide-simeth (MAALOX/MYLANTA) 200-200-20 MG/5ML suspension 30 mL  30 mL Oral Q4H PRN Myriam Forehand, NP       aspirin EC tablet 81 mg  81 mg Oral Daily Myriam Forehand, NP   81 mg at 07/02/23 9518   benzonatate (TESSALON) capsule 200 mg  200 mg Oral TID PRN Myriam Forehand, NP       hydrOXYzine (ATARAX) tablet 25 mg  25 mg Oral TID PRN Myriam Forehand, NP       magnesium hydroxide (MILK OF MAGNESIA) suspension 30 mL  30 mL Oral Daily PRN Myriam Forehand, NP       melatonin tablet 5 mg  5 mg Oral QHS Myriam Forehand, NP   5 mg at 07/01/23 2103   multivitamin with minerals tablet 1 tablet  1 tablet Oral Daily Myriam Forehand, NP   1 tablet at 07/02/23 0924   OLANZapine zydis (ZYPREXA) disintegrating tablet 10 mg  10 mg Oral Q8H PRN Myriam Forehand, NP       Oral care mouth rinse  15 mL Mouth Rinse PRN Myriam Forehand, NP       pantoprazole (PROTONIX) EC tablet 40 mg  40 mg Oral QHS Myriam Forehand, NP   40 mg at 07/01/23 2103   phenol (CHLORASEPTIC) mouth spray 1 spray  1 spray Mouth/Throat PRN Myriam Forehand, NP       QUEtiapine (SEROQUEL) tablet 50 mg  50 mg Oral BID Myriam Forehand, NP   50 mg at 07/02/23 8416   sertraline (ZOLOFT) tablet 50 mg  50 mg Oral Daily Myriam Forehand, NP   50 mg at 07/02/23 6063   thiamine (VITAMIN B1) tablet 100 mg   100 mg Oral Daily Myriam Forehand, NP   100 mg at 07/02/23 0160   topiramate (TOPAMAX) tablet 25 mg  25 mg Oral Daily Myriam Forehand, NP   25 mg at 07/02/23 1093   traZODone (DESYREL) tablet 25 mg  25 mg Oral QHS PRN Myriam Forehand, NP       PTA Medications: Medications Prior to Admission  Medication Sig Dispense Refill Last Dose/Taking   amitriptyline (ELAVIL) 10 MG tablet Take 10 mg by mouth at bedtime.      aspirin EC 81 MG tablet Take 1 tablet (81 mg total) by mouth daily. Swallow whole.      pantoprazole (PROTONIX) 40 MG tablet Take 40 mg by mouth daily.      QUEtiapine (SEROQUEL) 50 MG tablet Take 1 tablet (50 mg total) by mouth 2 (two) times daily.      sertraline (ZOLOFT) 50 MG tablet Take 50 mg by mouth daily.       Patient Stressors:    Patient Strengths:    Treatment Modalities: Medication Management, Group therapy,  Case management,  1 to 1 session with clinician, Psychoeducation, Recreational therapy.   Physician Treatment Plan for Primary Diagnosis: Major depressive disorder, recurrent episode, moderate (HCC) Long Term Goal(s): Improvement in symptoms so as ready for discharge   Short Term Goals: Ability to identify changes in lifestyle to reduce recurrence of condition will improve Ability to verbalize feelings will improve Ability to disclose and discuss suicidal ideas Ability to demonstrate self-control will improve Ability to identify and develop effective coping behaviors will improve Ability to maintain clinical measurements within normal limits will improve Compliance with prescribed medications will improve Ability to identify triggers associated with substance abuse/mental health issues will improve  Medication Management: Evaluate patient's response, side effects, and tolerance of medication regimen.  Therapeutic Interventions: 1 to 1 sessions, Unit Group sessions and Medication administration.  Evaluation of Outcomes: Not Met  Physician Treatment Plan for  Secondary Diagnosis: Principal Problem:   Major depressive disorder, recurrent episode, moderate (HCC) Active Problems:   ETOH abuse  Long Term Goal(s): Improvement in symptoms so as ready for discharge   Short Term Goals: Ability to identify changes in lifestyle to reduce recurrence of condition will improve Ability to verbalize feelings will improve Ability to disclose and discuss suicidal ideas Ability to demonstrate self-control will improve Ability to identify and develop effective coping behaviors will improve Ability to maintain clinical measurements within normal limits will improve Compliance with prescribed medications will improve Ability to identify triggers associated with substance abuse/mental health issues will improve     Medication Management: Evaluate patient's response, side effects, and tolerance of medication regimen.  Therapeutic Interventions: 1 to 1 sessions, Unit Group sessions and Medication administration.  Evaluation of Outcomes: Not Met   RN Treatment Plan for Primary Diagnosis: Major depressive disorder, recurrent episode, moderate (HCC) Long Term Goal(s): Knowledge of disease and therapeutic regimen to maintain health will improve  Short Term Goals: Ability to remain free from injury will improve, Ability to verbalize frustration and anger appropriately will improve, Ability to demonstrate self-control, Ability to participate in decision making will improve, Ability to verbalize feelings will improve, Ability to disclose and discuss suicidal ideas, Ability to identify and develop effective coping behaviors will improve, and Compliance with prescribed medications will improve  Medication Management: RN will administer medications as ordered by provider, will assess and evaluate patient's response and provide education to patient for prescribed medication. RN will report any adverse and/or side effects to prescribing provider.  Therapeutic Interventions: 1  on 1 counseling sessions, Psychoeducation, Medication administration, Evaluate responses to treatment, Monitor vital signs and CBGs as ordered, Perform/monitor CIWA, COWS, AIMS and Fall Risk screenings as ordered, Perform wound care treatments as ordered.  Evaluation of Outcomes: Not Met   LCSW Treatment Plan for Primary Diagnosis: Major depressive disorder, recurrent episode, moderate (HCC) Long Term Goal(s): Safe transition to appropriate next level of care at discharge, Engage patient in therapeutic group addressing interpersonal concerns.  Short Term Goals: Engage patient in aftercare planning with referrals and resources, Increase social support, Increase ability to appropriately verbalize feelings, Increase emotional regulation, Facilitate acceptance of mental health diagnosis and concerns, Facilitate patient progression through stages of change regarding substance use diagnoses and concerns, Identify triggers associated with mental health/substance abuse issues, and Increase skills for wellness and recovery  Therapeutic Interventions: Assess for all discharge needs, 1 to 1 time with Social worker, Explore available resources and support systems, Assess for adequacy in community support network, Educate family and significant other(s) on suicide prevention, Complete Psychosocial  Assessment, Interpersonal group therapy.  Evaluation of Outcomes: Not Met   Progress in Treatment: Attending groups: No. Participating in groups: No. Taking medication as prescribed: Yes. Toleration medication: Yes. Family/Significant other contact made: Yes, individual(s) contacted:  wife, Bryceson Brodeur. Patient understands diagnosis: Yes. Discussing patient identified problems/goals with staff: Yes. Medical problems stabilized or resolved: Yes. Denies suicidal/homicidal ideation: Yes. Issues/concerns per patient self-inventory: No. Other: none.  New problem(s) identified: No, Describe:  none  identified.  New Short Term/Long Term Goal(s): elimination of symptoms of psychosis, medication management for mood stabilization; elimination of SI thoughts; development of comprehensive mental wellness/sobriety plan.  Patient Goals:  "Try to set up something for substance abuse treatment that I can connect with on the outside.  Discharge Plan or Barriers: CSW will assist pt with development of an appropriate aftercare/discharge plan.   Reason for Continuation of Hospitalization: Depression Medication stabilization Suicidal ideation  Estimated Length of Stay: 1-7 days  Last 3 [place] Suicide Severity Risk Score: Flowsheet Row Admission (Current) from 06/30/2023 in [facility] ED to Hosp-Admission (Discharged) from 06/15/2023 in [facility] ICU[facility]CCU  C-SSRS RISK CATEGORY Error: Question 2 not populated High Risk       Last PHQ 2/9 Scores:     No data to display          Scribe for Treatment Team: Glenis Smoker, LCSW 07/02/2023 10:27 AM

## 2023-07-02 NOTE — Progress Notes (Signed)
[facility] MD Progress Note  07/02/2023 7:26 PM Ryan Miranda  MRN:  [id number] Subjective:  47 year old Caucasian male who reports, "I am feeling a lot better." He denies suicidal ideation (SI), homicidal ideation (HI), and auditory/visual hallucinations (AVH). The patient expresses no physical complaints and feels ready to engage in his recovery process.The patient demonstrates significant improvement in mood, behavior, and participation in treatment activities. He is stable post-ICU, with no observed tremors or other residual physical symptoms from delirium tremens. The patient denies SI, HI, and AVH, and appears engaged in the recovery process. Principal Problem: Major depressive disorder, recurrent episode, moderate (HCC) Diagnosis: Principal Problem:   Major depressive disorder, recurrent episode, moderate (HCC) Active Problems:   ETOH abuse  Total Time spent with patient: 1.5 hours  Past Psychiatric History: see below  Past Medical History:  Past Medical History:  Diagnosis Date   Depression    Elevated ferritin level    Elevated LFTs    Hypertension    Iron overload 02/06/2019   Laryngopharyngeal reflux (LPR)    OSA on CPAP    Tremors of nervous system    Unintentional weight loss     Past Surgical History:  Procedure Laterality Date   BREAST EXCISIONAL BIOPSY Left 1993   neg   COLONOSCOPY     MASTECTOMY PARTIAL / LUMPECTOMY     Family History:  Family History  Problem Relation Age of Onset   Cancer Mother    Hypertension Father    Cancer Maternal Aunt    Cancer Maternal Grandfather    Family Psychiatric  History: none reported Social History:  Social History   Substance and Sexual Activity  Alcohol Use Not Currently   Alcohol/week: 0.0 standard drinks of alcohol     Social History   Substance and Sexual Activity  Drug Use Not Currently    Social History   Socioeconomic History   Marital status: Married    Spouse name: Not on file   Number of  children: Not on file   Years of education: Not on file   Highest education level: Not on file  Occupational History   Not on file  Tobacco Use   Smoking status: Never   Smokeless tobacco: Never  Substance and Sexual Activity   Alcohol use: Not Currently    Alcohol/week: 0.0 standard drinks of alcohol   Drug use: Not Currently   Sexual activity: Yes  Other Topics Concern   Not on file  Social History Narrative   Not on file   Social Drivers of Health   Financial Resource Strain: High Risk (03/27/2023)   Received from [facility]   Overall Financial Resource Strain (CARDIA)    Difficulty of Paying Living Expenses: Hard  Food Insecurity: No Food Insecurity (06/30/2023)   Hunger Vital Sign    Worried About Running Out of Food in the Last Year: Never true    Ran Out of Food in the Last Year: Never true  Transportation Needs: No Transportation Needs (06/30/2023)   PRAPARE - Administrator, Civil Service (Medical): No    Lack of Transportation (Non-Medical): No  Physical Activity: Patient Unable To Answer (03/27/2023)   Received from [facility]   Exercise Vital Sign    Days of Exercise per Week: Patient unable to answer    Minutes of Exercise per Session: Patient unable to answer  Stress: Stress Concern Present (03/27/2023)   Received from [facility]   [place]  [employer] - Occupational Stress Questionnaire    Feeling of Stress : Very much  Social Connections: Unknown (03/27/2023)   Received from [facility]   Social Connection and Isolation Panel [NHANES]    Frequency of Communication with Friends and Family: Once a week    Frequency of Social Gatherings with Friends and Family: Not on file    Attends Religious Services: Never    Database administrator or Organizations: No    Attends Engineer, structural: Never    Marital Status: Separated   Additional Social  History:                         Sleep: Good  Appetite:  Good  Current Medications: Current Facility-Administered Medications  Medication Dose Route Frequency Provider Last Rate Last Admin   acetaminophen (TYLENOL) tablet 650 mg  650 mg Oral Q6H PRN Myriam Forehand, NP       Or   acetaminophen (TYLENOL) suppository 650 mg  650 mg Rectal Q6H PRN Myriam Forehand, NP       alum & mag hydroxide-simeth (MAALOX/MYLANTA) 200-200-20 MG/5ML suspension 30 mL  30 mL Oral Q4H PRN Myriam Forehand, NP       aspirin EC tablet 81 mg  81 mg Oral Daily Myriam Forehand, NP   81 mg at 07/02/23 1610   benzonatate (TESSALON) capsule 200 mg  200 mg Oral TID PRN Myriam Forehand, NP       hydrOXYzine (ATARAX) tablet 25 mg  25 mg Oral TID PRN Myriam Forehand, NP       magnesium hydroxide (MILK OF MAGNESIA) suspension 30 mL  30 mL Oral Daily PRN Myriam Forehand, NP       melatonin tablet 5 mg  5 mg Oral QHS Myriam Forehand, NP   5 mg at 07/01/23 2103   multivitamin with minerals tablet 1 tablet  1 tablet Oral Daily Myriam Forehand, NP   1 tablet at 07/02/23 0924   OLANZapine zydis (ZYPREXA) disintegrating tablet 10 mg  10 mg Oral Q8H PRN Myriam Forehand, NP       Oral care mouth rinse  15 mL Mouth Rinse PRN Myriam Forehand, NP       pantoprazole (PROTONIX) EC tablet 40 mg  40 mg Oral QHS Myriam Forehand, NP   40 mg at 07/01/23 2103   phenol (CHLORASEPTIC) mouth spray 1 spray  1 spray Mouth/Throat PRN Myriam Forehand, NP       QUEtiapine (SEROQUEL) tablet 50 mg  50 mg Oral BID Myriam Forehand, NP   50 mg at 07/02/23 1744   sertraline (ZOLOFT) tablet 50 mg  50 mg Oral Daily Myriam Forehand, NP   50 mg at 07/02/23 9604   thiamine (VITAMIN B1) tablet 100 mg  100 mg Oral Daily Myriam Forehand, NP   100 mg at 07/02/23 5409   topiramate (TOPAMAX) tablet 25 mg  25 mg Oral Daily Myriam Forehand, NP   25 mg at 07/02/23 8119   traZODone (DESYREL) tablet 25 mg  25 mg Oral QHS PRN Myriam Forehand, NP        Lab Results: No results found for this or  any previous visit (from the past 48 hours).  Blood Alcohol level:  Lab Results  Component Value Date   ETH [facility]) 06/15/2023    Metabolic Disorder Labs:  No results found for: "HGBA1C", "MPG" No results found for: "PROLACTIN" Lab Results  Component Value Date   TRIG 119 06/25/2023    Physical Findings: AIMS:  , ,  ,  ,    CIWA:    COWS:     Musculoskeletal: Strength & Muscle Tone: within normal limits Gait & Station: normal Patient leans: N/A  Psychiatric Specialty Exam:  Presentation  General Appearance:  Appropriate for Environment; Neat  Eye Contact: Good  Speech: Clear and Coherent; Normal Rate  Speech Volume: Normal  Handedness: Right   Mood and Affect  Mood: Euthymic  Affect: Appropriate   Thought Process  Thought Processes: Coherent  Descriptions of Associations:Intact  Orientation:Full (Time, Place and Person) (and situation)  Thought Content:WDL  History of Schizophrenia/Schizoaffective disorder:No  Duration of Psychotic Symptoms:1 weeks Hallucinations:Hallucinations: None  Ideas of Reference:None  Suicidal Thoughts:Suicidal Thoughts: No  Homicidal Thoughts:Homicidal Thoughts: No   Sensorium  Memory: Immediate Good; Remote Good  Judgment: Good  Insight: Good   Executive Functions  Concentration: Good  Attention Span: Good  Recall: Good  Fund of Knowledge: Good  Language: Good   Psychomotor Activity  Psychomotor Activity: Psychomotor Activity: Normal   Assets  Assets: Communication Skills; Financial Resources/Insurance; Housing; Social Support   Sleep  Sleep: Sleep: Good Number of Hours of Sleep: 8    Physical Exam: Physical Exam Vitals and nursing note reviewed.  Constitutional:      Appearance: Normal appearance.  HENT:     Head: Normocephalic and atraumatic.     Nose: Nose normal.  Pulmonary:     Effort: Pulmonary effort is normal.  Musculoskeletal:        General: Normal  range of motion.     Cervical back: Normal range of motion.  Neurological:     General: No focal deficit present.     Mental Status: He is alert. Mental status is at baseline.  Psychiatric:        Attention and Perception: Attention and perception normal.        Mood and Affect: Mood and affect normal.        Speech: Speech normal.        Behavior: Behavior normal. Behavior is cooperative.        Thought Content: Thought content normal.        Cognition and Memory: Cognition and memory normal.        Judgment: Judgment normal.    ROS Blood pressure 109/70, pulse 94, temperature 98.2 F (36.8 C), resp. rate 16, height 5\' 10"  (1.778 m), weight 99.8 kg, SpO2 100%. Body mass index is 31.57 kg/m.   Treatment Plan Summary: Daily contact with patient to assess and evaluate symptoms and progress in treatment and Medication management Sertraline (Zoloft) 50 mg daily: For depression and anxiety. Quetiapine (Seroquel) 50 mg BID: For mood stabilization and insomnia. Trazodone 25 mg QHS PRN: For insomnia. Topiramate 25 mg daily: Initiate for mood stabilization and potential reduction of alcohol cravings. Engage the patient in group and individual therapy sessions to address coping strategies and relapse prevention. Provide psychoeducation about medication adherence and its role in mood stabilization Myriam Forehand, NP 07/02/2023, 7:26 PM

## 2023-07-02 NOTE — Group Note (Signed)
[facility] LCSW Group Therapy Note    Group Date: 07/02/2023 Start Time: 1315 End Time: 1415  Type of Therapy and Topic:  Group Therapy:  Overcoming Obstacles  Participation Level:  [facility] PARTICIPATION LEVEL: Did Not Attend  Mood:  Description of Group:   In this group patients will be encouraged to explore what they see as obstacles to their own wellness and recovery. They will be guided to discuss their thoughts, feelings, and behaviors related to these obstacles. The group will process together ways to cope with barriers, with attention given to specific choices patients can make. Each patient will be challenged to identify changes they are motivated to make in order to overcome their obstacles. This group will be process-oriented, with patients participating in exploration of their own experiences as well as giving and receiving support and challenge from other group members.  Therapeutic Goals: 1. Patient will identify personal and current obstacles as they relate to admission. 2. Patient will identify barriers that currently interfere with their wellness or overcoming obstacles.  3. Patient will identify feelings, thought process and behaviors related to these barriers. 4. Patient will identify two changes they are willing to make to overcome these obstacles:    Summary of Patient Progress Patient did not attend group.   Therapeutic Modalities:   Cognitive Behavioral Therapy Solution Focused Therapy Motivational Interviewing Relapse Prevention Therapy   Harden Mo, LCSW

## 2023-07-03 DIAGNOSIS — F331 Major depressive disorder, recurrent, moderate: Secondary | ICD-10-CM | POA: Diagnosis not present

## 2023-07-03 MED ORDER — NALTREXONE HCL 50 MG PO TABS
50.0000 mg | ORAL_TABLET | Freq: Every day | ORAL | Status: DC
Start: 2023-07-04 — End: 2023-07-05
  Administered 2023-07-04 – 2023-07-05 (×2): 50 mg via ORAL
  Filled 2023-07-03 (×2): qty 1

## 2023-07-03 MED ORDER — TOPIRAMATE 25 MG PO TABS
25.0000 mg | ORAL_TABLET | ORAL | Status: DC
  Administered 2023-07-03 – 2023-07-05 (×4): 25 mg via ORAL
  Filled 2023-07-03 (×5): qty 1

## 2023-07-03 MED ORDER — TRAZODONE HCL 100 MG PO TABS
100.0000 mg | ORAL_TABLET | Freq: Every day | ORAL | Status: DC
  Administered 2023-07-03 – 2023-07-04 (×2): 100 mg via ORAL
  Filled 2023-07-03 (×2): qty 1

## 2023-07-03 NOTE — Group Note (Signed)
Date:  07/03/2023 Time:  3:34 PM  Group Topic/Focus:  Activity Group:  The focus of the group is to promote activity for the patients to encourage them to go outside to the courtyard for some fresh air and some exercise.    Participation Level:  Did Not Attend   Ryan Miranda 07/03/2023, 3:34 PM

## 2023-07-03 NOTE — Group Note (Signed)
LCSW Group Therapy Note   Group Date: 07/03/2023 Start Time: 1300 End Time: 1405   Type of Therapy and Topic:  Group Therapy: Challenging Core Beliefs  Participation Level:  Did Not Attend  Description of Group:  Patients were educated about core beliefs and asked to identify one harmful core belief that they have. Patients were asked to explore from where those beliefs originate. Patients were asked to discuss how those beliefs make them feel and the resulting behaviors of those beliefs. They were then be asked if those beliefs are true and, if so, what evidence they have to support them. Lastly, group members were challenged to replace those negative core beliefs with helpful beliefs.   Therapeutic Goals:   1. Patient will identify harmful core beliefs and explore the origins of such beliefs. 2. Patient will identify feelings and behaviors that result from those core beliefs. 3. Patient will discuss whether such beliefs are true. 4.  Patient will replace harmful core beliefs with helpful ones.  Summary of Patient Progress:  Patient did not attend.   Therapeutic Modalities: Cognitive Behavioral Therapy; Solution-Focused Therapy   Lowry Ram, Theresia Majors 07/03/2023  2:36 PM

## 2023-07-03 NOTE — Plan of Care (Signed)
 Pt compliant with procedures on the unit  Problem: Education: Goal: Knowledge of [facility] Education information/materials will improve Outcome: Progressing Goal: Emotional status will improve Outcome: Progressing Goal: Mental status will improve Outcome: Progressing Goal: Verbalization of understanding the information provided will improve Outcome: Progressing   Problem: Activity: Goal: Interest or engagement in activities will improve Outcome: Progressing Goal: Sleeping patterns will improve Outcome: Progressing   Problem: Coping: Goal: Ability to verbalize frustrations and anger appropriately will improve Outcome: Progressing Goal: Ability to demonstrate self-control will improve Outcome: Progressing   Problem: Health Behavior/Discharge Planning: Goal: Identification of resources available to assist in meeting health care needs will improve Outcome: Progressing Goal: Compliance with treatment plan for underlying cause of condition will improve Outcome: Progressing   Problem: Physical Regulation: Goal: Ability to maintain clinical measurements within normal limits will improve Outcome: Progressing   Problem: Safety: Goal: Periods of time without injury will increase Outcome: Progressing

## 2023-07-03 NOTE — Progress Notes (Signed)
   07/03/23 1000  Psych Admission Type (Psych Patients Only)  Admission Status Involuntary  Psychosocial Assessment  Patient Complaints Depression (slight depression, stating "I'm ready to go home".)  Eye Contact Fair  Facial Expression Flat  Affect Appropriate to circumstance  Speech Logical/coherent  Interaction Assertive  Motor Activity Slow  Appearance/Hygiene In scrubs;Unremarkable  Behavior Characteristics Cooperative;Appropriate to situation  Mood Pleasant  Aggressive Behavior  Effect No apparent injury  Thought Process  Coherency WDL  Content WDL  Delusions None reported or observed  Perception WDL  Hallucination None reported or observed  Judgment WDL  Confusion None  Danger to Self  Current suicidal ideation? Denies  Danger to Others  Danger to Others None reported or observed

## 2023-07-03 NOTE — Progress Notes (Signed)
[facility] MD Progress Note  07/03/2023 2:15 PM Ryan Miranda  MRN:  [id number] Subjective: Ryan Miranda is seen on rounds.  He is still going through a little bit of detox.  He states that he is feeling better.  He was in therapy after he separated from his wife.  He threw him over the edge when he found out that his divorce was final.  He denies being suicidal.  I have discussed medications with him including starting ReVia.  He is doing pretty well on Topamax.  I also talked to him about trazodone because he does have some trouble sleeping at night. Principal Problem: Major depressive disorder, recurrent episode, moderate (HCC) Diagnosis: Principal Problem:   Major depressive disorder, recurrent episode, moderate (HCC) Active Problems:   ETOH abuse  Total Time spent with patient: 15 minutes  Past Psychiatric History: Depression and alcohol abuse  Past Medical History:  Past Medical History:  Diagnosis Date   Depression    Elevated ferritin level    Elevated LFTs    Hypertension    Iron overload 02/06/2019   Laryngopharyngeal reflux (LPR)    OSA on CPAP    Tremors of nervous system    Unintentional weight loss     Past Surgical History:  Procedure Laterality Date   BREAST EXCISIONAL BIOPSY Left 1993   neg   COLONOSCOPY     MASTECTOMY PARTIAL / LUMPECTOMY     Family History:  Family History  Problem Relation Age of Onset   Cancer Mother    Hypertension Father    Cancer Maternal Aunt    Cancer Maternal Grandfather    Family Psychiatric  History: Unremarkable Social History:  Social History   Substance and Sexual Activity  Alcohol Use Not Currently   Alcohol/week: 0.0 standard drinks of alcohol     Social History   Substance and Sexual Activity  Drug Use Not Currently    Social History   Socioeconomic History   Marital status: Married    Spouse name: Not on file   Number of children: Not on file   Years of education: Not on file   Highest education level: Not  on file  Occupational History   Not on file  Tobacco Use   Smoking status: Never   Smokeless tobacco: Never  Substance and Sexual Activity   Alcohol use: Not Currently    Alcohol/week: 0.0 standard drinks of alcohol   Drug use: Not Currently   Sexual activity: Yes  Other Topics Concern   Not on file  Social History Narrative   Not on file   Social Drivers of Health   Financial Resource Strain: High Risk (03/27/2023)   Received from [facility]   Overall Financial Resource Strain (CARDIA)    Difficulty of Paying Living Expenses: Hard  Food Insecurity: No Food Insecurity (06/30/2023)   Hunger Vital Sign    Worried About Running Out of Food in the Last Year: Never true    Ran Out of Food in the Last Year: Never true  Transportation Needs: No Transportation Needs (06/30/2023)   PRAPARE - Administrator, Civil Service (Medical): No    Lack of Transportation (Non-Medical): No  Physical Activity: Patient Unable To Answer (03/27/2023)   Received from [facility]   Exercise Vital Sign    Days of Exercise per Week: Patient unable to answer    Minutes of Exercise per Session: Patient unable to answer  Stress: Stress Concern Present (03/27/2023)  Received from [facility]   [employer] of Occupational Health - Occupational Stress Questionnaire    Feeling of Stress : Very much  Social Connections: Unknown (03/27/2023)   Received from [facility]   Social Connection and Isolation Panel [NHANES]    Frequency of Communication with Friends and Family: Once a week    Frequency of Social Gatherings with Friends and Family: Not on file    Attends Religious Services: Never    Database administrator or Organizations: No    Attends Engineer, structural: Never    Marital Status: Separated   Additional Social History:                         Sleep: Fair  Appetite:  Fair  Current  Medications: Current Facility-Administered Medications  Medication Dose Route Frequency Provider Last Rate Last Admin   acetaminophen (TYLENOL) tablet 650 mg  650 mg Oral Q6H PRN Myriam Forehand, NP       Or   acetaminophen (TYLENOL) suppository 650 mg  650 mg Rectal Q6H PRN Myriam Forehand, NP       alum & mag hydroxide-simeth (MAALOX/MYLANTA) 200-200-20 MG/5ML suspension 30 mL  30 mL Oral Q4H PRN Myriam Forehand, NP       aspirin EC tablet 81 mg  81 mg Oral Daily Myriam Forehand, NP   81 mg at 07/03/23 5784   benzonatate (TESSALON) capsule 200 mg  200 mg Oral TID PRN Myriam Forehand, NP       hydrOXYzine (ATARAX) tablet 25 mg  25 mg Oral TID PRN Myriam Forehand, NP       magnesium hydroxide (MILK OF MAGNESIA) suspension 30 mL  30 mL Oral Daily PRN Myriam Forehand, NP       melatonin tablet 5 mg  5 mg Oral QHS Myriam Forehand, NP   5 mg at 07/02/23 2100   multivitamin with minerals tablet 1 tablet  1 tablet Oral Daily Myriam Forehand, NP   1 tablet at 07/03/23 0937   OLANZapine zydis (ZYPREXA) disintegrating tablet 10 mg  10 mg Oral Q8H PRN Myriam Forehand, NP       Oral care mouth rinse  15 mL Mouth Rinse PRN Myriam Forehand, NP       pantoprazole (PROTONIX) EC tablet 40 mg  40 mg Oral QHS Myriam Forehand, NP   40 mg at 07/02/23 2100   phenol (CHLORASEPTIC) mouth spray 1 spray  1 spray Mouth/Throat PRN Myriam Forehand, NP       QUEtiapine (SEROQUEL) tablet 50 mg  50 mg Oral BID Myriam Forehand, NP   50 mg at 07/03/23 6962   sertraline (ZOLOFT) tablet 50 mg  50 mg Oral Daily Myriam Forehand, NP   50 mg at 07/03/23 9528   thiamine (VITAMIN B1) tablet 100 mg  100 mg Oral Daily Myriam Forehand, NP   100 mg at 07/03/23 4132   topiramate (TOPAMAX) tablet 25 mg  25 mg Oral Daily Myriam Forehand, NP   25 mg at 07/03/23 4401   traZODone (DESYREL) tablet 25 mg  25 mg Oral QHS PRN Myriam Forehand, NP        Lab Results: No results found for this or any previous visit (from the past 48 hours).  Blood Alcohol level:  Lab Results   Component Value Date   [facility]  365 (HH) 06/15/2023    Metabolic Disorder Labs: No results found for: "HGBA1C", "MPG" No results found for: "PROLACTIN" Lab Results  Component Value Date   TRIG 119 06/25/2023    Physical Findings: AIMS:  , ,  ,  ,    CIWA:    COWS:     Musculoskeletal: Strength & Muscle Tone: within normal limits Gait & Station: normal Patient leans: N/A  Psychiatric Specialty Exam:  Presentation  General Appearance:  Appropriate for Environment; Neat  Eye Contact: Good  Speech: Clear and Coherent; Normal Rate  Speech Volume: Normal  Handedness: Right   Mood and Affect  Mood: Euthymic  Affect: Appropriate   Thought Process  Thought Processes: Coherent  Descriptions of Associations:Intact  Orientation:Full (Time, Place and Person) (and situation)  Thought Content:WDL  History of Schizophrenia/Schizoaffective disorder:No  Duration of Psychotic Symptoms:No data recorded Hallucinations:Hallucinations: None  Ideas of Reference:None  Suicidal Thoughts:Suicidal Thoughts: No  Homicidal Thoughts:Homicidal Thoughts: No   Sensorium  Memory: Immediate Good; Remote Good  Judgment: Good  Insight: Good   Executive Functions  Concentration: Good  Attention Span: Good  Recall: Good  Fund of Knowledge: Good  Language: Good   Psychomotor Activity  Psychomotor Activity: Psychomotor Activity: Normal   Assets  Assets: Communication Skills; Financial Resources/Insurance; Housing; Social Support   Sleep  Sleep: Sleep: Good Number of Hours of Sleep: 8     Blood pressure 126/71, pulse 76, temperature 98.5 F (36.9 C), resp. rate (!) 21, height 5\' 10"  (1.778 m), weight 99.8 kg, SpO2 97%. Body mass index is 31.57 kg/m.   Treatment Plan Summary: Daily contact with patient to assess and evaluate symptoms and progress in treatment, Medication management, and Plan start ReVia 50 mg/day.  Increase Topamax to 25  twice a day and add trazodone 100 at bedtime.  Charma Mocarski [place], DO 07/03/2023, 2:15 PM

## 2023-07-03 NOTE — Group Note (Signed)
Recreation Therapy Group Note   Group Topic:Other  Group Date: 07/03/2023 Start Time: 1530 End Time: 1630 Facilitators: Clinton Gallant, CTRS Location:  Craft Room  LRT, nurses, social workers, Facilities manager, and patients got together for [employer]. LRT provided Christmas theme coloring pages, as well. Everyone voluntarily took turns singing and talking about their holiday traditions.    Affect/Mood: N/A   Participation Level: Did not attend    Clinical Observations/Individualized Feedback: Patient did not attend group.   Plan: Continue to engage patient in RT group sessions 2-3x/week.   Rosina Lowenstein, LRT, CTRS 07/03/2023 5:42 PM

## 2023-07-03 NOTE — Group Note (Signed)
Recreation Therapy Group Note   Group Topic:Goal Setting  Group Date: 07/03/2023 Start Time: 1000 End Time: 1100 Facilitators: Rosina Lowenstein, LRT, CTRS Location:  Craft Room  Group Description: Product/process development scientist. Patients were given many different magazines, a glue stick, markers, and a piece of cardstock paper. LRT and pts discussed the importance of having goals in life. LRT and pts discussed the difference between short-term and long-term goals, as well as what a SMART goal is. LRT encouraged pts to create a vision board, with images they picked and then cut out with safety scissors from the magazine, for themselves, that capture their short and long-term goals. LRT encouraged pts to show and explain their vision board to the group.   Goal Area(s) Addressed:  Patient will gain knowledge of short vs. long term goals.  Patient will identify goals for themselves. Patient will practice setting SMART goals. Patient will verbalize their goals to LRT and peers.   Affect/Mood: N/A   Participation Level: Did not attend    Clinical Observations/Individualized Feedback: Patient did not attend group.   Plan: Continue to engage patient in RT group sessions 2-3x/week.   Rosina Lowenstein, LRT, CTRS 07/03/2023 12:41 PM

## 2023-07-03 NOTE — Plan of Care (Signed)

## 2023-07-03 NOTE — Group Note (Signed)
Date:  07/03/2023 Time:  10:49 PM  Group Topic/Focus:  Wrap-Up Group:   The focus of this group is to help patients review their daily goal of treatment and discuss progress on daily workbooks.    Participation Level:  Minimal  Participation Quality:  Appropriate and Attentive  Affect:  Appropriate and Flat  Cognitive:  Alert and Appropriate  Insight: Appropriate  Engagement in Group:  Engaged  Modes of Intervention:  Discussion and Support  Additional Comments:   Pt does not communicate during group but is engaged with what is being said. Pt does not report any concerns at this time.   Miranda,Ryan Deloatch E 07/03/2023, 10:49 PM

## 2023-07-04 DIAGNOSIS — F331 Major depressive disorder, recurrent, moderate: Secondary | ICD-10-CM | POA: Diagnosis not present

## 2023-07-04 NOTE — Progress Notes (Signed)
   07/04/23 0900  Psych Admission Type (Psych Patients Only)  Admission Status Involuntary  Psychosocial Assessment  Patient Complaints Depression (per patient, "I need to talk to someone because I'm going home today. There's nothing else they can do for me here, I can start looking for AA meetings to go to at home".)  Eye Contact Fair;Watchful  Facial Expression Worried  Affect Appropriate to circumstance  Speech Logical/coherent  Interaction Assertive  Motor Activity Slow  Appearance/Hygiene In scrubs;Unremarkable  Behavior Characteristics Cooperative;Appropriate to situation  Mood Preoccupied;Pleasant  Aggressive Behavior  Effect No apparent injury  Thought Process  Coherency WDL  Content Blaming others  Delusions None reported or observed  Perception WDL  Hallucination None reported or observed  Judgment WDL  Confusion None  Danger to Self  Current suicidal ideation? Denies  Danger to Others  Danger to Others None reported or observed

## 2023-07-04 NOTE — Progress Notes (Signed)
[facility] MD Progress Note  07/04/2023 1:57 PM Ryan Miranda  MRN:  [id number] Subjective: Ryan Miranda is doing well with his new medications.  He states that he slept better last night.  No problems with the Prevea.  Social works in a setting up with some outpatient appointments.  Has been compliant with medications.  Has been in good controls.  He denies any side effects.  He is doing well and I told him I would get him out tomorrow morning.  He currently denies any suicidal ideation. Principal Problem: Major depressive disorder, recurrent episode, moderate (HCC) Diagnosis: Principal Problem:   Major depressive disorder, recurrent episode, moderate (HCC) Active Problems:   ETOH abuse  Total Time spent with patient: 15 minutes  Past Psychiatric History: Depression and alcohol abuse  Past Medical History:  Past Medical History:  Diagnosis Date   Depression    Elevated ferritin level    Elevated LFTs    Hypertension    Iron overload 02/06/2019   Laryngopharyngeal reflux (LPR)    OSA on CPAP    Tremors of nervous system    Unintentional weight loss     Past Surgical History:  Procedure Laterality Date   BREAST EXCISIONAL BIOPSY Left 1993   neg   COLONOSCOPY     MASTECTOMY PARTIAL / LUMPECTOMY     Family History:  Family History  Problem Relation Age of Onset   Cancer Mother    Hypertension Father    Cancer Maternal Aunt    Cancer Maternal Grandfather    Family Psychiatric  History: Unremarkable Social History:  Social History   Substance and Sexual Activity  Alcohol Use Not Currently   Alcohol/week: 0.0 standard drinks of alcohol     Social History   Substance and Sexual Activity  Drug Use Not Currently    Social History   Socioeconomic History   Marital status: Married    Spouse name: Not on file   Number of children: Not on file   Years of education: Not on file   Highest education level: Not on file  Occupational History   Not on file  Tobacco Use    Smoking status: Never   Smokeless tobacco: Never  Substance and Sexual Activity   Alcohol use: Not Currently    Alcohol/week: 0.0 standard drinks of alcohol   Drug use: Not Currently   Sexual activity: Yes  Other Topics Concern   Not on file  Social History Narrative   Not on file   Social Drivers of Health   Financial Resource Strain: High Risk (03/27/2023)   Received from [facility]   Overall Financial Resource Strain (CARDIA)    Difficulty of Paying Living Expenses: Hard  Food Insecurity: No Food Insecurity (06/30/2023)   Hunger Vital Sign    Worried About Running Out of Food in the Last Year: Never true    Ran Out of Food in the Last Year: Never true  Transportation Needs: No Transportation Needs (06/30/2023)   PRAPARE - Administrator, Civil Service (Medical): No    Lack of Transportation (Non-Medical): No  Physical Activity: Patient Unable To Answer (03/27/2023)   Received from [facility]   Exercise Vital Sign    Days of Exercise per Week: Patient unable to answer    Minutes of Exercise per Session: Patient unable to answer  Stress: Stress Concern Present (03/27/2023)   Received from [facility] of Occupational Health -  Occupational Stress Questionnaire    Feeling of Stress : Very much  Social Connections: Unknown (03/27/2023)   Received from [facility]   Social Connection and Isolation Panel [NHANES]    Frequency of Communication with Friends and Family: Once a week    Frequency of Social Gatherings with Friends and Family: Not on file    Attends Religious Services: Never    Database administrator or Organizations: No    Attends Engineer, structural: Never    Marital Status: Separated   Additional Social History:                         Sleep: Good  Appetite:  Good  Current Medications: Current Facility-Administered Medications   Medication Dose Route Frequency Provider Last Rate Last Admin   acetaminophen (TYLENOL) tablet 650 mg  650 mg Oral Q6H PRN Myriam Forehand, NP       Or   acetaminophen (TYLENOL) suppository 650 mg  650 mg Rectal Q6H PRN Myriam Forehand, NP       alum & mag hydroxide-simeth (MAALOX/MYLANTA) 200-200-20 MG/5ML suspension 30 mL  30 mL Oral Q4H PRN Myriam Forehand, NP       aspirin EC tablet 81 mg  81 mg Oral Daily Myriam Forehand, NP   81 mg at 07/04/23 0830   benzonatate (TESSALON) capsule 200 mg  200 mg Oral TID PRN Myriam Forehand, NP       hydrOXYzine (ATARAX) tablet 25 mg  25 mg Oral TID PRN Myriam Forehand, NP       magnesium hydroxide (MILK OF MAGNESIA) suspension 30 mL  30 mL Oral Daily PRN Myriam Forehand, NP       melatonin tablet 5 mg  5 mg Oral QHS Myriam Forehand, NP   5 mg at 07/03/23 2102   multivitamin with minerals tablet 1 tablet  1 tablet Oral Daily Myriam Forehand, NP   1 tablet at 07/04/23 0830   naltrexone (DEPADE) tablet 50 mg  50 mg Oral Daily Sarina Ill, DO   50 mg at 07/04/23 0831   OLANZapine zydis (ZYPREXA) disintegrating tablet 10 mg  10 mg Oral Q8H PRN Myriam Forehand, NP       Oral care mouth rinse  15 mL Mouth Rinse PRN Myriam Forehand, NP       pantoprazole (PROTONIX) EC tablet 40 mg  40 mg Oral QHS Myriam Forehand, NP   40 mg at 07/03/23 2102   phenol (CHLORASEPTIC) mouth spray 1 spray  1 spray Mouth/Throat PRN Myriam Forehand, NP       QUEtiapine (SEROQUEL) tablet 50 mg  50 mg Oral BID Myriam Forehand, NP   50 mg at 07/04/23 0830   sertraline (ZOLOFT) tablet 50 mg  50 mg Oral Daily Myriam Forehand, NP   50 mg at 07/04/23 0830   thiamine (VITAMIN B1) tablet 100 mg  100 mg Oral Daily Myriam Forehand, NP   100 mg at 07/04/23 0830   topiramate (TOPAMAX) tablet 25 mg  25 mg Oral [id number] Sarina Ill, DO   25 mg at 07/04/23 0830   traZODone (DESYREL) tablet 100 mg  100 mg Oral QHS Sarina Ill, DO   100 mg at 07/03/23 2102    Lab Results: No results found for  this or any previous visit (from the past 48 hours).  Blood Alcohol  level:  Lab Results  Component Value Date   ETH 365 (HH) 06/15/2023    Metabolic Disorder Labs: No results found for: "HGBA1C", "MPG" No results found for: "PROLACTIN" Lab Results  Component Value Date   TRIG 119 06/25/2023    Physical Findings: AIMS:  , ,  ,  ,    CIWA:    COWS:     Musculoskeletal: Strength & Muscle Tone: within normal limits Gait & Station: normal Patient leans: N/A  Psychiatric Specialty Exam:  Presentation  General Appearance:  Appropriate for Environment; Neat  Eye Contact: Good  Speech: Clear and Coherent; Normal Rate  Speech Volume: Normal  Handedness: Right   Mood and Affect  Mood: Euthymic  Affect: Appropriate   Thought Process  Thought Processes: Coherent  Descriptions of Associations:Intact  Orientation:Full (Time, Place and Person) (and situation)  Thought Content:WDL  History of Schizophrenia/Schizoaffective disorder:No  Duration of Psychotic Symptoms:No data recorded Hallucinations:No data recorded Ideas of Reference:None  Suicidal Thoughts:No data recorded Homicidal Thoughts:No data recorded  Sensorium  Memory: Immediate Good; Remote Good  Judgment: Good  Insight: Good   Executive Functions  Concentration: Good  Attention Span: Good  Recall: Good  Fund of Knowledge: Good  Language: Good   Psychomotor Activity  Psychomotor Activity:No data recorded  Assets  Assets: Communication Skills; Financial Resources/Insurance; Housing; Social Support   Sleep  Sleep:No data recorded    Blood pressure 111/69, pulse 71, temperature 98.6 F (37 C), resp. rate (!) 22, height 5\' 10"  (1.778 m), weight 99.8 kg, SpO2 96%. Body mass index is 31.57 kg/m.   Treatment Plan Summary: Daily contact with patient to assess and evaluate symptoms and progress in treatment, Medication management, and Plan continue current  medications.  Discharge tomorrow morning  Sarina Ill, DO 07/04/2023, 1:57 PM

## 2023-07-04 NOTE — Group Note (Signed)
Date:  07/04/2023 Time:  9:25 PM  Group Topic/Focus:  Managing Feelings:   The focus of this group is to identify what feelings patients have difficulty handling and develop a plan to handle them in a healthier way upon discharge.    Participation Level:  Active  Participation Quality:  Appropriate and Attentive  Affect:  Appropriate  Cognitive:  Alert and Appropriate  Insight: Good and Improving  Engagement in Group:  Developing/Improving and Engaged  Modes of Intervention:  Discussion, Rapport Building, and Support  Additional Comments:     Cristel Rail 07/04/2023, 9:25 PM

## 2023-07-04 NOTE — Plan of Care (Signed)
D- Patient alert and oriented. Patient is pleasant no issues. Denies SI, HI, AVH, and pain. A- Scheduled medications administered to patient, per MD orders. Support and encouragement provided.  Routine safety checks conducted every 15 minutes.  Patient informed to notify staff with problems or concerns. R- No adverse drug reactions noted. Patient contracts for safety at this time. Patient compliant with medications and treatment plan. Patient receptive, calm, and cooperative. Patient interacts well with others on the unit.  Patient remains safe at this time.  Problem: Education: Goal: Knowledge of [facility] Education information/materials will improve Outcome: Progressing Goal: Emotional status will improve Outcome: Progressing Goal: Mental status will improve Outcome: Progressing Goal: Verbalization of understanding the information provided will improve Outcome: Progressing   Problem: Activity: Goal: Interest or engagement in activities will improve Outcome: Progressing Goal: Sleeping patterns will improve Outcome: Progressing   Problem: Coping: Goal: Ability to verbalize frustrations and anger appropriately will improve Outcome: Progressing Goal: Ability to demonstrate self-control will improve Outcome: Progressing   Problem: Health Behavior/Discharge Planning: Goal: Identification of resources available to assist in meeting health care needs will improve Outcome: Progressing Goal: Compliance with treatment plan for underlying cause of condition will improve Outcome: Progressing

## 2023-07-04 NOTE — Progress Notes (Signed)
Pleasant and cooperative with care. Reports feeling good about discharge on tomorrow. Denies SI, HI, AVH. Medication compliant. Appropriate with staff and peers. Encouragement and support provided. Safety checks maintained. Meds given as prescribed. Pt receptive and remains safe on unit with q 15 min checks.

## 2023-07-04 NOTE — Plan of Care (Signed)

## 2023-07-04 NOTE — Group Note (Signed)
Date:  07/04/2023 Time:  9:57 AM  Group Topic/Focus:  Goals Group:   The focus of this group is to help patients establish daily goals to achieve during treatment and discuss how the patient can incorporate goal setting into their daily lives to aide in recovery.    Participation Level:  Did Not Attend   Lynelle Smoke [facility] 07/04/2023, 9:57 AM

## 2023-07-04 NOTE — Group Note (Signed)
[facility] LCSW Group Therapy Note   Group Date: 07/04/2023 Start Time: 1300 End Time: 1350   Type of Therapy/Topic:  Group Therapy:  Emotion Regulation  Participation Level:  Did Not Attend   Description of Group:    The purpose of this group is to assist patients in learning to regulate negative emotions and experience positive emotions. Patients will be guided to discuss ways in which they have been vulnerable to their negative emotions. These vulnerabilities will be juxtaposed with experiences of positive emotions or situations, and patients challenged to use positive emotions to combat negative ones. Special emphasis will be placed on coping with negative emotions in conflict situations, and patients will process healthy conflict resolution skills.  Therapeutic Goals: Patient will identify two positive emotions or experiences to reflect on in order to balance out negative emotions:  Patient will label two or more emotions that they find the most difficult to experience:  Patient will be able to demonstrate positive conflict resolution skills through discussion or role plays:   Summary of Patient Progress: X   Therapeutic Modalities:   Cognitive Behavioral Therapy Feelings Identification Dialectical Behavioral Therapy   Glenis Smoker, LCSW

## 2023-07-05 DIAGNOSIS — F331 Major depressive disorder, recurrent, moderate: Secondary | ICD-10-CM | POA: Diagnosis not present

## 2023-07-05 MED ORDER — TOPIRAMATE 25 MG PO TABS: 25.0000 mg | ORAL_TABLET | ORAL | 3 refills | Status: AC

## 2023-07-05 MED ORDER — NALTREXONE HCL 50 MG PO TABS: 50.0000 mg | ORAL_TABLET | Freq: Every day | ORAL | 3 refills | Status: AC

## 2023-07-05 MED ORDER — TRAZODONE HCL 100 MG PO TABS: 100.0000 mg | ORAL_TABLET | Freq: Every day | ORAL | 3 refills | Status: AC

## 2023-07-05 MED ORDER — SERTRALINE HCL 50 MG PO TABS: 50.0000 mg | ORAL_TABLET | Freq: Every day | ORAL | 3 refills | Status: AC

## 2023-07-05 MED ORDER — QUETIAPINE FUMARATE 50 MG PO TABS: 50.0000 mg | ORAL_TABLET | Freq: Two times a day (BID) | ORAL | 3 refills | Status: AC

## 2023-07-05 NOTE — Progress Notes (Signed)
Patient pleasant and cooperative on approach. Denies SI,HI and AVH. Verbalized understanding discharge instructions,prescriptions and follow up care.  All belongings returned from BMU locker. Suicide safety plan filled by patient and placed in chart. Copy given to patient.Patient escorted out by staff and transported by cab. 

## 2023-07-05 NOTE — [facility] Suicide Risk Assessment (Signed)
[facility] Discharge Suicide Risk Assessment   Principal Problem: Major depressive disorder, recurrent episode, moderate (HCC) Discharge Diagnoses: Principal Problem:   Major depressive disorder, recurrent episode, moderate (HCC) Active Problems:   ETOH abuse   Total Time spent with patient: 1 hour  Musculoskeletal: Strength & Muscle Tone: within normal limits Gait & Station: normal Patient leans: N/A  Psychiatric Specialty Exam  Presentation  General Appearance:  Appropriate for Environment; Neat  Eye Contact: Good  Speech: Clear and Coherent; Normal Rate  Speech Volume: Normal  Handedness: Right   Mood and Affect  Mood: Euthymic  Duration of Depression Symptoms: Greater than two weeks  Affect: Appropriate   Thought Process  Thought Processes: Coherent  Descriptions of Associations:Intact  Orientation:Full (Time, Place and Person) (and situation)  Thought Content:WDL  History of Schizophrenia/Schizoaffective disorder:No  Duration of Psychotic Symptoms:No data recorded Hallucinations:No data recorded Ideas of Reference:None  Suicidal Thoughts:No data recorded Homicidal Thoughts:No data recorded  Sensorium  Memory: Immediate Good; Remote Good  Judgment: Good  Insight: Good   Executive Functions  Concentration: Good  Attention Span: Good  Recall: Good  Fund of Knowledge: Good  Language: Good   Psychomotor Activity  Psychomotor Activity:No data recorded  Assets  Assets: Communication Skills; Financial Resources/Insurance; Housing; Social Support   Sleep  Sleep:No data recorded  Physical Exam: Physical Exam Vitals and nursing note reviewed.  Constitutional:      Appearance: Normal appearance. He is normal weight.  Neurological:     General: No focal deficit present.     Mental Status: He is alert and oriented to person, place, and time.  Psychiatric:        Attention and Perception: Attention and perception normal.         Mood and Affect: Mood and affect normal.        Speech: Speech normal.        Behavior: Behavior normal. Behavior is cooperative.        Thought Content: Thought content normal.        Cognition and Memory: Cognition and memory normal.        Judgment: Judgment normal.    Review of Systems  Constitutional: Negative.   HENT: Negative.    Eyes: Negative.   Respiratory: Negative.    Cardiovascular: Negative.   Gastrointestinal: Negative.   Genitourinary: Negative.   Musculoskeletal: Negative.   Skin: Negative.   Neurological: Negative.   Endo/Heme/Allergies: Negative.   Psychiatric/Behavioral: Negative.     Blood pressure 100/71, pulse 75, temperature 97.9 F (36.6 C), temperature source Oral, resp. rate (!) 22, height 5\' 10"  (1.778 m), weight 99.8 kg, SpO2 98%. Body mass index is 31.57 kg/m.  Mental Status Per Nursing Assessment::   On Admission:  NA  Demographic Factors:  Male and Caucasian  Loss Factors: Loss of significant relationship  Historical Factors: NA  Risk Reduction Factors:   NA  Continued Clinical Symptoms:  Alcohol/Substance Abuse/Dependencies  Cognitive Features That Contribute To Risk:  None    Suicide Risk:  Minimal: No identifiable suicidal ideation.  Patients presenting with no risk factors but with morbid ruminations; may be classified as minimal risk based on the severity of the depressive symptoms   Follow-up Information     [facility] Follow up.   Why: Appointment is scheduled for 08/07/2023 at 2:30PM Contact information: [address] [place] [address] [phone]                 Plan Of Care/Follow-up  recommendations: [facility]   Sarina Ill, DO 07/05/2023, 9:47 AM

## 2023-07-05 NOTE — Discharge Summary (Signed)
Physician Discharge Summary Note  Patient:  Ryan Miranda is an 47 y.o., male MRN:  [id number] DOB:  26-Jun-1976 Patient phone:  [phone] (home)  Patient address:   [address] [place] [address],  Total Time spent with patient: 1 hour  Date of Admission:  06/30/2023 Date of Discharge: 07/05/2023  Reason for Admission:   47 year old Caucasian male admitted under IVC following an episode of acute suicidal ideation in the context of alcohol use. He reports "binge drinking" throughout his adult life, with periods of sobriety interspersed with heavy alcohol consumption. He states that his most recent binge began after discovering through an online search that his divorce had been finalized. He describes consuming approximately two pints of whiskey and a couple of glasses of wine daily during this period.The patient recalls sending a text message to his ex-wife, including a photo of a loaded firearm, and states he had intended to end his life. His son intervened and stopped him from carrying out the act. He was subsequently brought to the emergency department. During the assessment, he expressed remorse and tearfully reported, "I just want my family back." The patient denies current suicidal ideation (SI), homicidal ideation (HI), auditory or visual hallucinations (AVH), or paranoia. However, during this hospitalization, he stated, "I don't remember much since Saturday," indicating memory gaps potentially related to alcohol use and withdrawal.The patient has a history of depression, alcohol use disorder, and obstructive sleep apnea managed with CPAP. He denies any history of prior suicide attempts or inpatient psychiatric hospitalizations. He recalls seeing a psychiatrist at age 52 following his father's death in a domestic violence incident involving his mother. He has previously engaged in therapy, most recently six months ago, but discontinued due to financial concerns and  perceived lack of benefit.The patient reports a positive family psychiatric history, including parental alcoholism, abuse, and mental health issues. He is currently employed in the [employer] of [employer], where he has worked for 19 years. He denies using substances other than alcohol, including nicotine or illicit drugs.During his emergency department stay, the patient presented with alcohol withdrawal symptoms, including tachycardia, hypertension, and anxiety. He received IV phenobarbital, benzodiazepines, and supportive care. His lab results revealed hyponatremia, hypokalemia, and leukocytosis, which were treated appropriately. His alcohol level was 365 upon arrival. The patient was placed on CIWA protocol and managed for delirium tremens, which resolved with treatment.Currently, the patient remains alert, oriented x4, and cooperative. He continues to express motivation to "get better" and reconnect with his family, with no acute psychiatric or medical concerns. He is being evaluated for inpatient psychiatric care to address alcohol use disorder, depression, and the recent suicidal episode.   Principal Problem: Major depressive disorder, recurrent episode, moderate (HCC) Discharge Diagnoses: Principal Problem:   Major depressive disorder, recurrent episode, moderate (HCC) Active Problems:   ETOH abuse   Past Psychiatric History: Depression and alcohol abuse  Past Medical History:  Past Medical History:  Diagnosis Date   Depression    Elevated ferritin level    Elevated LFTs    Hypertension    Iron overload 02/06/2019   Laryngopharyngeal reflux (LPR)    OSA on CPAP    Tremors of nervous system    Unintentional weight loss     Past Surgical History:  Procedure Laterality Date   BREAST EXCISIONAL BIOPSY Left 1993   neg   COLONOSCOPY     MASTECTOMY PARTIAL / LUMPECTOMY     Family History:  Family History  Problem Relation  Age of Onset   Cancer Mother    Hypertension Father     Cancer Maternal Aunt    Cancer Maternal Grandfather    Family Psychiatric  History: Unremarkable Social History:  Social History   Substance and Sexual Activity  Alcohol Use Not Currently   Alcohol/week: 0.0 standard drinks of alcohol     Social History   Substance and Sexual Activity  Drug Use Not Currently    Social History   Socioeconomic History   Marital status: Married    Spouse name: Not on file   Number of children: Not on file   Years of education: Not on file   Highest education level: Not on file  Occupational History   Not on file  Tobacco Use   Smoking status: Never   Smokeless tobacco: Never  Substance and Sexual Activity   Alcohol use: Not Currently    Alcohol/week: 0.0 standard drinks of alcohol   Drug use: Not Currently   Sexual activity: Yes  Other Topics Concern   Not on file  Social History Narrative   Not on file   Social Drivers of Health   Financial Resource Strain: High Risk (03/27/2023)   Received from [facility]   Overall Financial Resource Strain (CARDIA)    Difficulty of Paying Living Expenses: Hard  Food Insecurity: No Food Insecurity (06/30/2023)   Hunger Vital Sign    Worried About Running Out of Food in the Last Year: Never true    Ran Out of Food in the Last Year: Never true  Transportation Needs: No Transportation Needs (06/30/2023)   PRAPARE - Administrator, Civil Service (Medical): No    Lack of Transportation (Non-Medical): No  Physical Activity: Patient Unable To Answer (03/27/2023)   Received from [facility]   Exercise Vital Sign    Days of Exercise per Week: Patient unable to answer    Minutes of Exercise per Session: Patient unable to answer  Stress: Stress Concern Present (03/27/2023)   Received from [facility] Occupational [facility] - Occupational Stress Questionnaire    Feeling of Stress : Very much  Social Connections:  Unknown (03/27/2023)   Received from [facility]   Social Connection and Isolation Panel [NHANES]    Frequency of Communication with Friends and Family: Once a week    Frequency of Social Gatherings with Friends and Family: Not on file    Attends Religious Services: Never    Database administrator or Organizations: No    Attends Banker Meetings: Never    Marital Status: Separated    Hospital Course: Ryan Miranda is a 47 year old white male who is voluntarily admitted to inpatient psychiatry for intoxication and suicidal ideation.  He apparently had texted his girlfriend that he was going to kill himself.  He found out that his divorce had been finalized.  He does admit that he has an alcohol problem and goes to binge drinking.  While on the unit he was pleasant cooperative and his Zoloft was restarted.  His Elavil was discontinued and trazodone was added for sleep anxiety and depression.  Revia and Topamax were added for cravings and Seroquel twice a day for anxiety.  He did really well with the medications.  His sleeping and eating improved.  His mood and affect improved.  It was felt that he maximize hospitalization he was discharged home.  On the day of discharge he  denied suicidal ideation, homicidal ideation, auditory or visual hallucinations.  His judgment and insight were good.  He did not have any withdrawal symptoms  Physical Findings: AIMS:  , ,  ,  ,    CIWA:    COWS:     Musculoskeletal: Strength & Muscle Tone: within normal limits Gait & Station: normal Patient leans: N/A   Psychiatric Specialty Exam:  Presentation  General Appearance:  Appropriate for Environment; Neat  Eye Contact: Good  Speech: Clear and Coherent; Normal Rate  Speech Volume: Normal  Handedness: Right   Mood and Affect  Mood: Euthymic  Affect: Appropriate   Thought Process  Thought Processes: Coherent  Descriptions of  Associations:Intact  Orientation:Full (Time, Place and Person) (and situation)  Thought Content:WDL  History of Schizophrenia/Schizoaffective disorder:No  Duration of Psychotic Symptoms:No data recorded Hallucinations:No data recorded Ideas of Reference:None  Suicidal Thoughts:No data recorded Homicidal Thoughts:No data recorded  Sensorium  Memory: Immediate Good; Remote Good  Judgment: Good  Insight: Good   Executive Functions  Concentration: Good  Attention Span: Good  Recall: Good  Fund of Knowledge: Good  Language: Good   Psychomotor Activity  Psychomotor Activity:No data recorded  Assets  Assets: Communication Skills; Financial Resources/Insurance; Housing; Social Support   Sleep  Sleep:No data recorded   Physical Exam: Physical Exam Vitals and nursing note reviewed.  Constitutional:      Appearance: Normal appearance. He is normal weight.  Neurological:     General: No focal deficit present.     Mental Status: He is alert and oriented to person, place, and time.  Psychiatric:        Attention and Perception: Attention and perception normal.        Mood and Affect: Mood and affect normal.        Speech: Speech normal.        Behavior: Behavior normal. Behavior is cooperative.        Thought Content: Thought content normal.        Cognition and Memory: Cognition and memory normal.        Judgment: Judgment normal.    Review of Systems  Constitutional: Negative.   HENT: Negative.    Eyes: Negative.   Respiratory: Negative.    Cardiovascular: Negative.   Gastrointestinal: Negative.   Genitourinary: Negative.   Musculoskeletal: Negative.   Skin: Negative.   Neurological: Negative.   Endo/Heme/Allergies: Negative.   Psychiatric/Behavioral: Negative.     Blood pressure 100/71, pulse 75, temperature 97.9 F (36.6 C), temperature source Oral, resp. rate (!) 22, height 5\' 10"  (1.778 m), weight 99.8 kg, SpO2 98%. Body mass index is  31.57 kg/m.   Social History   Tobacco Use  Smoking Status Never  Smokeless Tobacco Never   Tobacco Cessation:  N/A, patient does not currently use tobacco products   Blood Alcohol level:  Lab Results  Component Value Date   ETH 365 (HH) 06/15/2023    Metabolic Disorder Labs:  No results found for: "HGBA1C", "MPG" No results found for: "PROLACTIN" Lab Results  Component Value Date   TRIG 119 06/25/2023    See Psychiatric Specialty Exam and Suicide Risk Assessment completed by Attending Physician prior to discharge.  Discharge destination:  Home  Is patient on multiple antipsychotic therapies at discharge:  No   Has Patient had three or more failed trials of antipsychotic monotherapy by history:  No  Recommended Plan for Multiple Antipsychotic Therapies: NA   Allergies as of 07/05/2023   No Known Allergies  Medication List     STOP taking these medications    amitriptyline 10 MG tablet Commonly known as: ELAVIL       TAKE these medications      Indication  aspirin EC 81 MG tablet Take 1 tablet (81 mg total) by mouth daily. Swallow whole.  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 Start taking on: July 06, 2023  Indication: Abuse or Misuse of Alcohol   pantoprazole 40 MG tablet Commonly known as: PROTONIX Take 40 mg by mouth daily.  Indication: Gastroesophageal Reflux Disease   QUEtiapine 50 MG tablet Commonly known as: SEROQUEL Take 1 tablet (50 mg total) by mouth 2 (two) times daily.  Indication: Major Depressive Disorder   sertraline 50 MG tablet Commonly known as: ZOLOFT Take 1 tablet (50 mg total) by mouth daily.  Indication: Major Depressive Disorder   topiramate 25 MG tablet Commonly known as: TOPAMAX Take 1 tablet (25 mg total) by mouth 2 (two) times daily at 8 am and 4 pm.  Indication: Abuse or Misuse of  Alcohol   traZODone 100 MG tablet Commonly known as: DESYREL Take 1 tablet (100 mg total) by mouth at bedtime.  Indication: Abuse or Misuse of Alcohol, Trouble Sleeping, Major Depressive Disorder        Follow-up Information     [facility] Follow up.   Why: Appointment is scheduled for 08/07/2023 at 2:30PM Contact information: [address] [place] [address] [phone]                 Follow-up recommendations: [facility]    Signed: Sarina Ill, DO 07/05/2023, 10:03 AM

## 2023-07-05 NOTE — Group Note (Signed)
Recreation Therapy Group Note   Group Topic:Relaxation  Group Date: 07/05/2023 Start Time: 1000 End Time: 1045 Facilitators: Rosina Lowenstein, LRT, CTRS Location:  Craft Room  Group Description: PMR (Progressive Muscle Relaxation). LRT asks patients their current level of stress/anxiety from 1-10, with 10 being the highest. LRT educates patients on what PMR is and the benefits that come from it. Patients are asked to sit with their feet flat on the floor while sitting up and all the way back in their chair, if possible. LRT and pts follow a prompt through a speaker that requires you to tense and release different muscles in their body and focus on their breathing. During session, lights are off and soft music is being played. Pts are given a stress ball to use if needed. At the end of the prompt, LRT asks patients to rank their current levels of stress/anxiety from 1-10, 10 being the highest. LRT provides patients with an education handout on PMR.   Goal Area(s) Addressed:  Patients will be able to describe progressive muscle relaxation.  Patient will practice using relaxation technique. Patient will identify a new coping skill.  Patient will follow multistep directions to reduce anxiety and stress.   Affect/Mood: N/A   Participation Level: Did not attend    Clinical Observations/Individualized Feedback: Patient did not attend group.   Plan: Continue to engage patient in RT group sessions 2-3x/week.   Rosina Lowenstein, LRT, CTRS 07/05/2023 11:40 AM

## 2023-07-05 NOTE — Plan of Care (Signed)
  Problem: Activity: Goal: Interest or engagement in activities will improve Outcome: Not Progressing   Problem: Safety: Goal: Periods of time without injury will increase Outcome: Progressing

## 2023-07-05 NOTE — Group Note (Signed)
Date:  07/05/2023 Time:  10:50 AM  Group Topic/Focus:  Goals Group:   The focus of this group is to help patients establish daily goals to achieve during treatment and discuss how the patient can incorporate goal setting into their daily lives to aide in recovery.    Participation Level:  Did Not Attend   Lynelle [facility] 07/05/2023, 10:50 AM

## 2023-07-05 NOTE — Progress Notes (Signed)
  [facility] Adult Case Management Discharge Plan :  Will you be returning to the same living situation after discharge:  Yes,  Patient to return home.  At discharge, do you have transportation home?: No. CSW to coordinate discharge.  Do you have the ability to pay for your medications: Yes, [employer] / [employer]IG[employer] MANAGED   Release of information consent forms completed and in the chart;  Patient's signature needed at discharge.  Patient to Follow up at:  Follow-up Information     [facility] Follow up.   Why: Appointment is scheduled for 08/07/2023 at 2:30PM Contact information: [address] [place] [address] [phone]                 Next level of care provider has access to [facility] Link:no  Safety Planning and Suicide Prevention discussed: Yes,  SPE conducted with patient's wife. SPE material given at discharge.     Has patient been referred to the Quitline?: Patient does not use tobacco/nicotine products  Patient has been referred for addiction treatment: Patient refused referral for treatment; referral information given to patient at discharge.  Lowry Ram, LCSW 07/05/2023, 8:56 AM

## 2023-09-30 ENCOUNTER — Other Ambulatory Visit: Payer: Self-pay

## 2023-09-30 ENCOUNTER — Ambulatory Visit
Admission: EM | Admit: 2023-09-30 | Discharge: 2023-09-30 | Disposition: A | Discharge status: Home / Self Care | Attending: Emergency Medicine | Admitting: Emergency Medicine

## 2023-09-30 ENCOUNTER — Encounter: Payer: Self-pay | Admitting: Emergency Medicine

## 2023-09-30 DIAGNOSIS — J101 Influenza due to other identified influenza virus with other respiratory manifestations: Secondary | ICD-10-CM | POA: Diagnosis not present

## 2023-09-30 LAB — POC COVID19/FLU A&B COMBO
Covid Antigen, POC: NEGATIVE
Influenza A Antigen, POC: POSITIVE — AB
Influenza B Antigen, POC: NEGATIVE

## 2023-09-30 MED ORDER — OSELTAMIVIR PHOSPHATE 75 MG PO CAPS: 75.0000 mg | ORAL_CAPSULE | Freq: Two times a day (BID) | ORAL | 0 refills | Status: AC

## 2023-09-30 MED ORDER — PROMETHAZINE-DM 6.25-15 MG/5ML PO SYRP: 5.0000 mL | ORAL_SOLUTION | Freq: Every evening | ORAL | 0 refills | Status: AC | PRN

## 2023-09-30 MED ORDER — BENZONATATE 100 MG PO CAPS: 100.0000 mg | ORAL_CAPSULE | Freq: Three times a day (TID) | ORAL | 0 refills | Status: AC

## 2023-09-30 NOTE — Discharge Instructions (Addendum)
 Influenza A is a virus and should steadily improve in time it can take up to 7 to 10 days before you truly start to see a turnaround however things will get better  Begin Tamiflu every morning and every evening for 5 days to reduce the amount of virus in the body which helps to minimize symptoms    Use Tessalon pill every 8 hours as needed for cough, may use cough syrup at bedtime to allow for rest You can take Tylenol and/or Ibuprofen as needed for fever reduction and pain relief.   For cough: honey 1/2 to 1 teaspoon (you can dilute the honey in water or another fluid).  You can also use guaifenesin and dextromethorphan for cough. You can use a humidifier for chest congestion and cough.  If you don't have a humidifier, you can sit in the bathroom with the hot shower running.      For sore throat: try warm salt water gargles, cepacol lozenges, throat spray, warm tea or water with lemon/honey, popsicles or ice, or OTC cold relief medicine for throat discomfort.   For congestion: take a daily anti-histamine like Zyrtec, Claritin, and a oral decongestant, such as pseudoephedrine.  You can also use Flonase 1-2 sprays in each nostril daily.   It is important to stay hydrated: drink plenty of fluids (water, gatorade/powerade/pedialyte, juices, or teas) to keep your throat moisturized and help further relieve irritation/discomfort.

## 2023-09-30 NOTE — ED Provider Notes (Signed)
 Ryan Miranda    CSN: [id number] Arrival date & time: 09/30/23  2130      History   Chief Complaint Chief Complaint  Patient presents with   Cough    HPI Ryan Miranda is a 48 y.o. male.    Patient presents for evaluation of subjective fever, chills, generalized bodyaches, nasal congestion, and nonproductive cough and sore throat present for 3 days.  Had occurrence of vomiting 1 day ago, has resolved, poor intake but tolerating fluids.  No known sick contacts.  Denies shortness of breath and wheezing.  Has attempted use of cough drops and a nonnarcotic pain medicine.  Past Medical History:  Diagnosis Date   Depression    Elevated ferritin level    Elevated LFTs    Hypertension    Iron overload 02/06/2019   Laryngopharyngeal reflux (LPR)    OSA on CPAP    Tremors of nervous system    Unintentional weight loss     Patient Active Problem List   Diagnosis Date Noted   Major depressive disorder, recurrent episode, moderate (HCC) 07/01/2023   ETOH abuse 07/01/2023   Psychosis (HCC) 06/30/2023   Diarrhea of presumed infectious origin 06/28/2023   Alcohol withdrawal delirium, acute, hyperactive (HCC) 06/28/2023   Fever 06/26/2023   Hypomagnesemia 06/20/2023   Hypophosphatemia 06/20/2023   SBO (small bowel obstruction) (HCC) 06/20/2023   Depression with suicidal ideation 06/17/2023   GERD without esophagitis 06/17/2023   Delirium tremens (HCC) 06/17/2023   Leukocytosis 06/17/2023   Hypokalemia 06/17/2023   Hyponatremia 06/17/2023   Suicidal ideation 06/17/2023   Iron overload 02/06/2019    Past Surgical History:  Procedure Laterality Date   BREAST EXCISIONAL BIOPSY Left 1993   neg   COLONOSCOPY     MASTECTOMY PARTIAL / LUMPECTOMY         Home Medications    Prior to Admission medications   Medication Sig Start Date End Date Taking? Authorizing Provider  benzonatate (TESSALON) 100 MG capsule Take 1 capsule (100 mg total) by mouth every 8  (eight) hours. 09/30/23  Yes Quinta Eimer, Elita Boone, NP  oseltamivir (TAMIFLU) 75 MG capsule Take 1 capsule (75 mg total) by mouth every 12 (twelve) hours. 09/30/23  Yes Deckard Stuber R, NP  promethazine-dextromethorphan (PROMETHAZINE-DM) 6.25-15 MG/5ML syrup Take 5 mLs by mouth at bedtime as needed. 09/30/23  Yes Valinda Hoar, NP  aspirin EC 81 MG tablet Take 1 tablet (81 mg total) by mouth daily. Swallow whole. 06/29/23   Charise Killian, MD  naltrexone (DEPADE) 50 MG tablet Take 1 tablet (50 mg total) by mouth daily. 07/06/23   Sarina Ill, DO  pantoprazole (PROTONIX) 40 MG tablet Take 40 mg by mouth daily.    [provider]  QUEtiapine (SEROQUEL) 50 MG tablet Take 1 tablet (50 mg total) by mouth 2 (two) times daily. 07/05/23   Sarina Ill, DO  sertraline (ZOLOFT) 50 MG tablet Take 1 tablet (50 mg total) by mouth daily. 07/05/23   Sarina Ill, DO  topiramate (TOPAMAX) 25 MG tablet Take 1 tablet (25 mg total) by mouth 2 (two) times daily at 8 am and 4 pm. 07/05/23   Sarina Ill, DO  traZODone (DESYREL) 100 MG tablet Take 1 tablet (100 mg total) by mouth at bedtime. 07/05/23   Sarina Ill, DO    Family History Family History  Problem Relation Age of Onset   Cancer Mother    Hypertension Father    Cancer Maternal  Aunt    Cancer Maternal Grandfather     Social History Social History   Tobacco Use   Smoking status: Never   Smokeless tobacco: Never  Substance Use Topics   Alcohol use: Not Currently    Alcohol/week: 0.0 standard drinks of alcohol   Drug use: Not Currently     Allergies   Patient has no known allergies.   Review of Systems Review of Systems  Respiratory:  Positive for cough.      Physical Exam Triage Vital Signs ED Triage Vitals  Encounter Vitals Group     BP 09/30/23 0938 106/74     Systolic BP Percentile --      Diastolic BP Percentile --      Pulse Rate 09/30/23 0938 89     Resp  09/30/23 0938 18     Temp 09/30/23 0938 97.9 F (36.6 C)     Temp Source 09/30/23 0938 Temporal     SpO2 09/30/23 0938 95 %     Weight --      Height --      Head Circumference --      Peak Flow --      Pain Score 09/30/23 0939 0     Pain Loc --      Pain Education --      Exclude from Growth Chart --    No data found.  Updated Vital Signs BP 106/74 (BP Location: Left Arm)   Pulse 89   Temp 97.9 F (36.6 C) (Temporal)   Resp 18   SpO2 95%   Visual Acuity Right Eye Distance:   Left Eye Distance:   Bilateral Distance:    Right Eye Near:   Left Eye Near:    Bilateral Near:     Physical Exam Constitutional:      Appearance: Normal appearance.  HENT:     Head: Normocephalic.     Right Ear: Tympanic membrane, ear canal and external ear normal.     Left Ear: Tympanic membrane, ear canal and external ear normal.     Nose: Congestion present.     Mouth/Throat:     Pharynx: Posterior oropharyngeal erythema present. No oropharyngeal exudate.  Eyes:     Extraocular Movements: Extraocular movements intact.  Cardiovascular:     Rate and Rhythm: Normal rate and regular rhythm.     Pulses: Normal pulses.     Heart sounds: Normal heart sounds.  Pulmonary:     Effort: Pulmonary effort is normal.     Breath sounds: Normal breath sounds.  Musculoskeletal:     Cervical back: Normal range of motion and neck supple.  Neurological:     Mental Status: He is alert and oriented to person, place, and time. Mental status is at baseline.      UC Treatments / Results  Labs (all labs ordered are listed, but only abnormal results are displayed) Labs Reviewed  POC COVID19/FLU A&B COMBO - Abnormal; Notable for the following components:      Result Value   Influenza A Antigen, POC Positive (*)    All other components within normal limits    EKG   Radiology No results found.  Procedures Procedures (including critical care time)  Medications Ordered in UC Medications - No  data to display  Initial Impression / Assessment and Plan / UC Course  I have reviewed the triage vital signs and the nursing notes.  Pertinent labs & imaging results that were available during my care of  the patient were reviewed by me and considered in my medical decision making (see chart for details).  Influenza A  Patient is in no signs of distress nor toxic appearing.  Vital signs are stable.  Low suspicion for pneumonia, pneumothorax or bronchitis and therefore will defer imaging.  COVID test negative.  Prescribed Tamiflu, Tessalon and Promethazine DM.May use additional over-the-counter medications as needed for supportive care.  May follow-up with urgent care as needed if symptoms persist or worsen.  Note given.   Final Clinical Impressions(s) / UC Diagnoses   Final diagnoses:  Influenza A     Discharge Instructions      Influenza A is a virus and should steadily improve in time it can take up to 7 to 10 days before you truly start to see a turnaround however things will get better  Begin Tamiflu every morning and every evening for 5 days to reduce the amount of virus in the body which helps to minimize symptoms    Use Tessalon pill every 8 hours as needed for cough, may use cough syrup at bedtime to allow for rest You can take Tylenol and/or Ibuprofen as needed for fever reduction and pain relief.   For cough: honey 1/2 to 1 teaspoon (you can dilute the honey in water or another fluid).  You can also use guaifenesin and dextromethorphan for cough. You can use a humidifier for chest congestion and cough.  If you don't have a humidifier, you can sit in the bathroom with the hot shower running.      For sore throat: try warm salt water gargles, cepacol lozenges, throat spray, warm tea or water with lemon/honey, popsicles or ice, or OTC cold relief medicine for throat discomfort.   For congestion: take a daily anti-histamine like Zyrtec, Claritin, and a oral decongestant, such  as pseudoephedrine.  You can also use Flonase 1-2 sprays in each nostril daily.   It is important to stay hydrated: drink plenty of fluids (water, gatorade/powerade/pedialyte, juices, or teas) to keep your throat moisturized and help further relieve irritation/discomfort.    ED Prescriptions     Medication Sig Dispense Auth. Provider   oseltamivir (TAMIFLU) 75 MG capsule Take 1 capsule (75 mg total) by mouth every 12 (twelve) hours. 10 capsule Danniell Rotundo R, NP   benzonatate (TESSALON) 100 MG capsule Take 1 capsule (100 mg total) by mouth every 8 (eight) hours. 21 capsule Avalina Benko R, NP   promethazine-dextromethorphan (PROMETHAZINE-DM) 6.25-15 MG/5ML syrup Take 5 mLs by mouth at bedtime as needed. 118 mL Korbin Mapps, Elita Boone, NP      PDMP not reviewed this encounter.   Valinda Hoar, NP 09/30/23 1036

## 2023-09-30 NOTE — ED Triage Notes (Signed)
 Patient presents to [facility] for evaluation of cough since Friday with nasal congestion, body aches, malaise and fatigue

## 2024-04-04 ENCOUNTER — Other Ambulatory Visit: Payer: Self-pay | Admitting: Gastroenterology

## 2024-04-04 DIAGNOSIS — F1021 Alcohol dependence, in remission: Secondary | ICD-10-CM

## 2024-04-09 ENCOUNTER — Ambulatory Visit
Admission: RE | Admit: 2024-04-09 | Discharge: 2024-04-09 | Disposition: A | Discharge status: Home / Self Care | Source: Ambulatory Visit | Attending: Gastroenterology | Admitting: Gastroenterology

## 2024-04-09 DIAGNOSIS — F1021 Alcohol dependence, in remission: Secondary | ICD-10-CM | POA: Insufficient documentation

## 2024-04-15 ENCOUNTER — Ambulatory Visit: Payer: Self-pay

## 2024-04-15 ENCOUNTER — Other Ambulatory Visit: Payer: Self-pay | Admitting: Gastroenterology

## 2024-04-15 DIAGNOSIS — C220 Liver cell carcinoma: Secondary | ICD-10-CM

## 2024-04-15 DIAGNOSIS — K589 Irritable bowel syndrome without diarrhea: Secondary | ICD-10-CM | POA: Diagnosis not present

## 2024-04-15 DIAGNOSIS — K5289 Other specified noninfective gastroenteritis and colitis: Secondary | ICD-10-CM | POA: Diagnosis present

## 2024-04-19 ENCOUNTER — Ambulatory Visit
Admission: RE | Admit: 2024-04-19 | Discharge: 2024-04-19 | Disposition: A | Discharge status: Home / Self Care | Source: Ambulatory Visit | Attending: Gastroenterology | Admitting: Gastroenterology

## 2024-04-19 DIAGNOSIS — C220 Liver cell carcinoma: Secondary | ICD-10-CM | POA: Diagnosis present

## 2024-04-19 MED ORDER — GADOBUTROL 1 MMOL/ML IV SOLN
10.0000 mL | Freq: Once | INTRAVENOUS | Status: AC | PRN
  Administered 2024-04-19: 10 mL via INTRAVENOUS

## 2024-05-01 ENCOUNTER — Other Ambulatory Visit: Payer: Self-pay | Admitting: Gastroenterology

## 2024-05-01 DIAGNOSIS — F1021 Alcohol dependence, in remission: Secondary | ICD-10-CM

## 2024-05-12 ENCOUNTER — Ambulatory Visit
Admission: RE | Admit: 2024-05-12 | Discharge: 2024-05-12 | Disposition: A | Discharge status: Home / Self Care | Source: Ambulatory Visit | Attending: Gastroenterology | Admitting: Gastroenterology

## 2024-05-12 DIAGNOSIS — F1021 Alcohol dependence, in remission: Secondary | ICD-10-CM | POA: Insufficient documentation

## 2024-05-13 ENCOUNTER — Encounter: Payer: Self-pay | Admitting: Gastroenterology

## 2024-05-13 ENCOUNTER — Other Ambulatory Visit: Payer: Self-pay

## 2024-05-13 ENCOUNTER — Encounter: Payer: Self-pay | Admitting: Certified Registered Nurse Anesthetist

## 2024-05-13 ENCOUNTER — Ambulatory Visit
Admission: RE | Admit: 2024-05-13 | Discharge: 2024-05-13 | Disposition: A | Discharge status: Home / Self Care | Attending: Gastroenterology | Admitting: Gastroenterology

## 2024-05-13 ENCOUNTER — Encounter
Admission: RE | Disposition: A | Discharge status: Home / Self Care | Payer: Self-pay | Source: Home / Self Care | Attending: Gastroenterology

## 2024-05-13 DIAGNOSIS — K5150 Left sided colitis without complications: Secondary | ICD-10-CM | POA: Diagnosis not present

## 2024-05-13 DIAGNOSIS — Z59869 Financial insecurity, unspecified: Secondary | ICD-10-CM | POA: Insufficient documentation

## 2024-05-13 DIAGNOSIS — K5130 Ulcerative (chronic) rectosigmoiditis without complications: Secondary | ICD-10-CM | POA: Diagnosis not present

## 2024-05-13 DIAGNOSIS — K5100 Ulcerative (chronic) pancolitis without complications: Secondary | ICD-10-CM | POA: Diagnosis present

## 2024-05-13 HISTORY — PX: FLEXIBLE SIGMOIDOSCOPY: SHX5431

## 2024-05-13 SURGERY — SIGMOIDOSCOPY, FLEXIBLE

## 2024-05-13 NOTE — Op Note (Signed)
 [facility] Patient Name: Ryan Miranda Procedure Date: 05/13/2024 10:55 AM MRN: [id number] Account #: [id number] Date of Birth: 1975/12/05 Admit Type: Outpatient Age: 48 Room: [facility] ENDO ROOM 2 Gender: Male Note Status: Finalized Instrument Name: Colon Scope [id number] Procedure:             Flexible Sigmoidoscopy Indications:           For therapy of chronic ulcerative pancolitis Providers:             Ruel Kung MD, MD Referring MD:          Oneil PHEBE Pinal, MD (Referring MD) Medicines:             None Complications:         No immediate complications. Procedure:             Pre-Anesthesia Assessment:                        - Prior to the procedure, a History and Physical was                         performed, and patient medications, allergies and                         sensitivities were reviewed. The patient's tolerance                         of previous anesthesia was reviewed.                        - The risks and benefits of the procedure and the                         sedation options and risks were discussed with the                         patient. All questions were answered and informed                         consent was obtained.                        - ASA Grade Assessment: II - A patient with mild                         systemic disease.                        After obtaining informed consent, the scope was passed                         under direct vision. The Colonoscope was introduced                         through the anus and advanced to the the left                         transverse colon. The flexible sigmoidoscopy was  accomplished with ease. The patient tolerated the                         procedure well. The quality of the bowel preparation                         was good. Findings:      The perianal and digital rectal examinations were normal.      Diffuse moderate inflammation  characterized by congestion (edema) and       erythema was found in the recto-sigmoid colon. Biopsies were taken with       a cold forceps for histology.      Diffuse mild inflammation characterized by congestion (edema) and       erythema was found in the descending colon. Biopsies were taken with a       cold forceps for histology.      The exam was otherwise without abnormality. Impression:            - Diffuse moderate inflammation was found in the                         recto-sigmoid colon secondary to proctosigmoid                         ulcerative colitis. Biopsied.                        - Diffuse mild inflammation was found in the                         descending colon secondary to left-sided ulcerative                         colitis. Biopsied.                        - The examination was otherwise normal. Recommendation:        - Discharge patient to home (with escort).                        - Advance diet as tolerated.                        - Await pathology results.                        - Return to my office in 2 weeks. Procedure Code(s):     --- Professional ---                        [phone], Sigmoidoscopy, flexible; with biopsy, single or                         multiple Diagnosis Code(s):     --- Professional ---                        K51.30, Ulcerative (chronic) rectosigmoiditis without                         complications  K51.50, Left sided colitis without complications                        K51.00, Ulcerative (chronic) pancolitis without                         complications CPT copyright 2022 American Medical Association. All rights reserved. The codes documented in this report are preliminary and upon coder review may  be revised to meet current compliance requirements. Ruel Kung, MD Ruel Kung MD, MD 05/13/2024 11:12:49 AM This report has been signed electronically. Number of Addenda: 0 Note Initiated On: 05/13/2024 10:55  AM Total Procedure Duration: 0 hours 7 minutes 51 seconds  Estimated Blood Loss:  Estimated blood loss: none.      [facility]

## 2024-05-13 NOTE — Brief Op Note (Signed)
 Biopsies for CMV and HSV sent to lab

## 2024-05-13 NOTE — H&P (Addendum)
 Ryan Miranda , MD [address], [place], [place], [address] Phone: [phone] Fax: [phone]  Primary Care Physician:  Cleotilde Oneil FALCON, MD   Pre-Procedure History & Physical: HPI:  Ryan Miranda is a 48 y.o. male is here for a flexible sigmoidoscopy    Past Medical History:  Diagnosis Date   Depression    Elevated ferritin level    Elevated LFTs    Hypertension    Iron overload 02/06/2019   Laryngopharyngeal reflux (LPR)    OSA on CPAP    Tremors of nervous system    Unintentional weight loss     Past Surgical History:  Procedure Laterality Date   BREAST EXCISIONAL BIOPSY Left 1993   neg   COLONOSCOPY     MASTECTOMY PARTIAL / LUMPECTOMY      Prior to Admission medications   Medication Sig Start Date End Date Taking? Authorizing Provider  aspirin  EC 81 MG tablet Take 1 tablet (81 mg total) by mouth daily. Swallow whole. 06/29/23   Trudy Anthony HERO, MD  benzonatate  (TESSALON ) 100 MG capsule Take 1 capsule (100 mg total) by mouth every 8 (eight) hours. 09/30/23   White, Shelba SAUNDERS, NP  naltrexone  (DEPADE) 50 MG tablet Take 1 tablet (50 mg total) by mouth daily. 07/06/23   Cam Charlie Loving, DO  oseltamivir  (TAMIFLU ) 75 MG capsule Take 1 capsule (75 mg total) by mouth every 12 (twelve) hours. 09/30/23   White, Shelba SAUNDERS, NP  pantoprazole  (PROTONIX ) 40 MG tablet Take 40 mg by mouth daily.    [provider]  promethazine -dextromethorphan (PROMETHAZINE -DM) 6.25-15 MG/5ML syrup Take 5 mLs by mouth at bedtime as needed. 09/30/23   White, Shelba SAUNDERS, NP  QUEtiapine  (SEROQUEL ) 50 MG tablet Take 1 tablet (50 mg total) by mouth 2 (two) times daily. 07/05/23   Cam Charlie Loving, DO  sertraline  (ZOLOFT ) 50 MG tablet Take 1 tablet (50 mg total) by mouth daily. 07/05/23   Cam Charlie Loving, DO  topiramate  (TOPAMAX )  25 MG tablet Take 1 tablet (25 mg total) by mouth 2 (two) times daily at 8 am and 4 pm. 07/05/23   Cam Charlie Loving, DO  traZODone  (DESYREL ) 100 MG tablet Take 1 tablet (100 mg total) by mouth at bedtime. 07/05/23   Cam Charlie Loving, DO    Allergies as of 05/02/2024   (No Known Allergies)    Family History  Problem Relation Age of Onset   Cancer Mother    Hypertension Father    Cancer Maternal Aunt    Cancer Maternal Grandfather     Social History   Socioeconomic History   Marital status: Married    Spouse name: Not on file   Number of children: Not on file   Years of education: Not on file   Highest education level: Not on file  Occupational History   Not on file  Tobacco Use   Smoking status: Never   Smokeless tobacco: Never  Substance and Sexual Activity   Alcohol use: Not Currently    Alcohol/week: 0.0 standard drinks of alcohol   Drug use: Not Currently   Sexual activity: Yes  Other Topics Concern   Not on file  Social History Narrative   Not on file   Social Drivers of Health   Financial Resource Strain: Medium Risk (05/12/2024)   Received from [facility]   Overall Financial Resource Strain (CARDIA)    Difficulty of Paying Living Expenses: Somewhat hard  Food Insecurity:  No Food Insecurity (05/12/2024)   Received from [facility]   Hunger Vital Sign    Within the past 12 months, you worried that your food would run out before you got the money to buy more.: Never true    Within the past 12 months, the food you bought just didn't last and you didn't have money to get more.: Never true  Transportation Needs: No Transportation Needs (05/12/2024)   Received from [facility] - Transportation    In the past 12 months, has lack of transportation kept you from medical appointments or from getting medications?: No    Lack of Transportation (Non-Medical): No  Physical Activity: Patient  Unable To Answer (03/27/2023)   Received from [facility]   Exercise Vital Sign    On average, how many days per week do you engage in moderate to strenuous exercise (like a brisk walk)?: Patient unable to answer    On average, how many minutes do you engage in exercise at this level?: Patient unable to answer  Stress: Stress Concern Present (03/27/2023)   Received from [facility] - Occupational Stress Questionnaire    Feeling of Stress : Very much  Social Connections: Unknown (03/27/2023)   Received from [facility]   Social Connection and Isolation Panel    In a typical week, how many times do you talk on the phone with family, friends, or neighbors?: Once a week    Frequency of Social Gatherings with Friends and Family: Not on file    How often do you attend church or religious services?: Never    Do you belong to any clubs or organizations such as church groups, unions, fraternal or athletic groups, or school groups?: No    How often do you attend meetings of the clubs or organizations you belong to?: Never    Are you married, widowed, divorced, separated, never married, or living with a partner?: Separated  Intimate Partner Violence: Not At Risk (06/30/2023)   Humiliation, Afraid, Rape, and Kick questionnaire    Fear of Current or Ex-Partner: No    Emotionally Abused: No    Physically Abused: No    Sexually Abused: No    Review of Systems: See HPI, otherwise negative ROS  Physical Exam: There were no vitals taken for this visit. General:   Alert,  pleasant and cooperative in NAD Head:  Normocephalic and atraumatic. Neck:  Supple; no masses or thyromegaly. Lungs:  Clear throughout to auscultation, normal respiratory effort.    Heart:  +S1, +S2, Regular rate and rhythm, No edema. Abdomen:  Soft, nontender and nondistended. Normal bowel sounds, without guarding, and without rebound.    Neurologic:  Alert and  oriented x4;  grossly normal neurologically.  Impression/Plan: Xaiver Miranda is here for a flexible sigmoidoscopy for ulcerative colitis Risks, benefits, limitations, and alternatives regarding  colonoscopy have been reviewed with the patient.  Questions have been answered.  All parties agreeable.   Ryan Kung, MD  05/13/2024, 10:44 AM

## 2024-05-14 ENCOUNTER — Encounter: Payer: Self-pay | Admitting: Gastroenterology

## 2024-05-14 LAB — SURGICAL PATHOLOGY

## 2024-05-15 LAB — HSV CULTURE AND TYPING

## 2024-05-21 LAB — CYTOMEGALOVIRUS (CMV) CULTURE - CMVCUL

## 2024-05-28 ENCOUNTER — Other Ambulatory Visit: Payer: Self-pay | Admitting: Internal Medicine

## 2024-05-28 ENCOUNTER — Ambulatory Visit
Admission: RE | Admit: 2024-05-28 | Discharge: 2024-05-28 | Disposition: A | Discharge status: Home / Self Care | Source: Ambulatory Visit | Attending: Internal Medicine | Admitting: Internal Medicine

## 2024-05-28 DIAGNOSIS — I639 Cerebral infarction, unspecified: Secondary | ICD-10-CM | POA: Diagnosis present

## 2024-05-28 DIAGNOSIS — H539 Unspecified visual disturbance: Secondary | ICD-10-CM

## 2024-05-28 DIAGNOSIS — R519 Headache, unspecified: Secondary | ICD-10-CM | POA: Diagnosis present

## 2024-05-28 MED ORDER — GADOBUTROL 1 MMOL/ML IV SOLN
10.0000 mL | Freq: Once | INTRAVENOUS | Status: AC | PRN
  Administered 2024-05-28: 10 mL via INTRAVENOUS

## 2024-07-29 ENCOUNTER — Inpatient Hospital Stay: Admitting: Oncology

## 2024-07-29 ENCOUNTER — Other Ambulatory Visit
Admission: RE | Admit: 2024-07-29 | Discharge: 2024-07-29 | Disposition: A | Discharge status: Home / Self Care | Source: Ambulatory Visit | Attending: Gastroenterology | Admitting: Gastroenterology

## 2024-07-29 DIAGNOSIS — K5100 Ulcerative (chronic) pancolitis without complications: Secondary | ICD-10-CM | POA: Diagnosis present

## 2024-07-29 LAB — CBC WITH DIFFERENTIAL/PLATELET
Abs Immature Granulocytes: 0.36 K/uL — ABNORMAL HIGH (ref 0.00–0.07)
Basophils Absolute: 0.1 K/uL (ref 0.0–0.1)
Basophils Relative: 1 %
Eosinophils Absolute: 0.1 K/uL (ref 0.0–0.5)
Eosinophils Relative: 1 %
HCT: 43.4 % (ref 39.0–52.0)
Hemoglobin: 15 g/dL (ref 13.0–17.0)
Immature Granulocytes: 2 %
Lymphocytes Relative: 48 %
Lymphs Abs: 8.1 K/uL — ABNORMAL HIGH (ref 0.7–4.0)
MCH: 30.1 pg (ref 26.0–34.0)
MCHC: 34.6 g/dL (ref 30.0–36.0)
MCV: 87 fL (ref 80.0–100.0)
Monocytes Absolute: 1 K/uL (ref 0.1–1.0)
Monocytes Relative: 6 %
Neutro Abs: 7 K/uL (ref 1.7–7.7)
Neutrophils Relative %: 42 %
Platelets: 523 K/uL — ABNORMAL HIGH (ref 150–400)
RBC: 4.99 MIL/uL (ref 4.22–5.81)
RDW: 13.4 % (ref 11.5–15.5)
Smear Review: NORMAL
WBC: 16.7 K/uL — ABNORMAL HIGH (ref 4.0–10.5)
nRBC: 0 % (ref 0.0–0.2)

## 2024-07-30 ENCOUNTER — Ambulatory Visit: Payer: Self-pay | Admitting: Gastroenterology

## 2024-07-30 LAB — PATHOLOGIST SMEAR REVIEW

## 2024-08-14 ENCOUNTER — Other Ambulatory Visit: Payer: Self-pay | Admitting: Internal Medicine

## 2024-08-14 DIAGNOSIS — K760 Fatty (change of) liver, not elsewhere classified: Secondary | ICD-10-CM

## 2024-08-14 DIAGNOSIS — R1011 Right upper quadrant pain: Secondary | ICD-10-CM

## 2024-08-25 ENCOUNTER — Inpatient Hospital Stay: Admission: RE | Admit: 2024-08-25 | Source: Ambulatory Visit
